# Patient Record
Sex: Female | Born: 1935 | Race: White | Hispanic: No | Smoking: Never smoker
Health system: Southern US, Community
[De-identification: ages and names within clinical notes are randomized; demographics above are authoritative.]

## PROBLEM LIST (undated history)

## (undated) DIAGNOSIS — E7800 Pure hypercholesterolemia, unspecified: Secondary | ICD-10-CM

## (undated) DIAGNOSIS — E119 Type 2 diabetes mellitus without complications: Secondary | ICD-10-CM

## (undated) DIAGNOSIS — K5790 Diverticulosis of intestine, part unspecified, without perforation or abscess without bleeding: Secondary | ICD-10-CM

## (undated) DIAGNOSIS — Z923 Personal history of irradiation: Secondary | ICD-10-CM

## (undated) DIAGNOSIS — C50919 Malignant neoplasm of unspecified site of unspecified female breast: Secondary | ICD-10-CM

## (undated) DIAGNOSIS — G252 Other specified forms of tremor: Secondary | ICD-10-CM

## (undated) DIAGNOSIS — I10 Essential (primary) hypertension: Secondary | ICD-10-CM

## (undated) DIAGNOSIS — Z9221 Personal history of antineoplastic chemotherapy: Secondary | ICD-10-CM

## (undated) HISTORY — DX: Essential (primary) hypertension: I10

## (undated) HISTORY — DX: Other specified forms of tremor: G25.2

## (undated) HISTORY — DX: Pure hypercholesterolemia, unspecified: E78.00

## (undated) HISTORY — DX: Diverticulosis of intestine, part unspecified, without perforation or abscess without bleeding: K57.90

## (undated) HISTORY — DX: Type 2 diabetes mellitus without complications: E11.9

## (undated) HISTORY — PX: ABDOMINAL HYSTERECTOMY: SHX81

## (undated) HISTORY — PX: APPENDECTOMY: SHX54

---

## 2003-10-21 ENCOUNTER — Emergency Department (HOSPITAL_COMMUNITY): Admission: EM | Admit: 2003-10-21 | Discharge: 2003-10-21 | Payer: Self-pay | Admitting: Emergency Medicine

## 2004-06-12 ENCOUNTER — Ambulatory Visit: Payer: Self-pay | Admitting: Family Medicine

## 2005-10-06 ENCOUNTER — Ambulatory Visit: Payer: Self-pay | Admitting: Family Medicine

## 2006-04-10 ENCOUNTER — Emergency Department: Payer: Self-pay | Admitting: Emergency Medicine

## 2006-10-14 ENCOUNTER — Ambulatory Visit: Payer: Self-pay | Admitting: Family Medicine

## 2007-07-04 ENCOUNTER — Emergency Department: Payer: Self-pay | Admitting: Emergency Medicine

## 2007-12-01 ENCOUNTER — Ambulatory Visit: Payer: Self-pay | Admitting: Family Medicine

## 2008-03-16 DIAGNOSIS — Z923 Personal history of irradiation: Secondary | ICD-10-CM

## 2008-03-16 DIAGNOSIS — C50919 Malignant neoplasm of unspecified site of unspecified female breast: Secondary | ICD-10-CM

## 2008-03-16 DIAGNOSIS — Z9221 Personal history of antineoplastic chemotherapy: Secondary | ICD-10-CM

## 2008-03-16 HISTORY — DX: Malignant neoplasm of unspecified site of unspecified female breast: C50.919

## 2008-03-16 HISTORY — DX: Personal history of antineoplastic chemotherapy: Z92.21

## 2008-03-16 HISTORY — PX: BREAST EXCISIONAL BIOPSY: SUR124

## 2008-03-16 HISTORY — DX: Personal history of irradiation: Z92.3

## 2008-12-12 ENCOUNTER — Emergency Department: Payer: Self-pay | Admitting: Emergency Medicine

## 2008-12-27 ENCOUNTER — Ambulatory Visit: Payer: Self-pay | Admitting: Family Medicine

## 2009-01-14 ENCOUNTER — Ambulatory Visit: Payer: Self-pay | Admitting: Family Medicine

## 2009-02-12 ENCOUNTER — Ambulatory Visit: Payer: Self-pay | Admitting: Surgery

## 2009-02-28 ENCOUNTER — Ambulatory Visit: Payer: Self-pay | Admitting: Cardiology

## 2009-02-28 ENCOUNTER — Ambulatory Visit: Payer: Self-pay | Admitting: Surgery

## 2009-03-05 ENCOUNTER — Ambulatory Visit: Payer: Self-pay | Admitting: Surgery

## 2009-03-16 ENCOUNTER — Ambulatory Visit: Payer: Self-pay | Admitting: Internal Medicine

## 2009-04-03 ENCOUNTER — Ambulatory Visit: Payer: Self-pay | Admitting: Internal Medicine

## 2009-04-16 ENCOUNTER — Ambulatory Visit: Payer: Self-pay | Admitting: Internal Medicine

## 2009-05-14 ENCOUNTER — Ambulatory Visit: Payer: Self-pay | Admitting: Internal Medicine

## 2009-06-14 ENCOUNTER — Ambulatory Visit: Payer: Self-pay | Admitting: Internal Medicine

## 2009-07-14 ENCOUNTER — Ambulatory Visit: Payer: Self-pay | Admitting: Internal Medicine

## 2009-08-14 ENCOUNTER — Ambulatory Visit: Payer: Self-pay | Admitting: Internal Medicine

## 2009-09-13 ENCOUNTER — Ambulatory Visit: Payer: Self-pay | Admitting: Internal Medicine

## 2009-10-14 ENCOUNTER — Ambulatory Visit: Payer: Self-pay | Admitting: Internal Medicine

## 2009-11-14 ENCOUNTER — Ambulatory Visit: Payer: Self-pay | Admitting: Internal Medicine

## 2009-11-27 ENCOUNTER — Ambulatory Visit: Payer: Self-pay | Admitting: Ophthalmology

## 2010-04-17 ENCOUNTER — Ambulatory Visit: Payer: Self-pay | Admitting: Family Medicine

## 2010-04-28 ENCOUNTER — Ambulatory Visit: Payer: Self-pay | Admitting: Radiation Oncology

## 2010-05-15 ENCOUNTER — Ambulatory Visit: Payer: Self-pay | Admitting: Radiation Oncology

## 2010-10-27 ENCOUNTER — Ambulatory Visit: Payer: Self-pay | Admitting: Internal Medicine

## 2010-10-31 LAB — CANCER ANTIGEN 27.29: CA 27.29: 25.3 U/mL (ref 0.0–38.6)

## 2010-11-15 ENCOUNTER — Ambulatory Visit: Payer: Self-pay | Admitting: Internal Medicine

## 2011-03-10 ENCOUNTER — Emergency Department: Payer: Self-pay | Admitting: Internal Medicine

## 2011-04-20 ENCOUNTER — Ambulatory Visit: Payer: Self-pay | Admitting: Oncology

## 2011-05-06 ENCOUNTER — Ambulatory Visit: Payer: Self-pay | Admitting: Internal Medicine

## 2011-05-06 LAB — CBC CANCER CENTER
Basophil #: 0 x10 3/mm (ref 0.0–0.1)
Basophil %: 0.5 %
Eosinophil #: 0.2 x10 3/mm (ref 0.0–0.7)
HGB: 13.3 g/dL (ref 12.0–16.0)
Lymphocyte #: 1.6 x10 3/mm (ref 1.0–3.6)
Lymphocyte %: 23.5 %
MCH: 29.4 pg (ref 26.0–34.0)
MCHC: 34.3 g/dL (ref 32.0–36.0)
MCV: 86 fL (ref 80–100)
Neutrophil #: 4.3 x10 3/mm (ref 1.4–6.5)
RBC: 4.52 10*6/uL (ref 3.80–5.20)
RDW: 13.3 % (ref 11.5–14.5)
WBC: 6.8 x10 3/mm (ref 3.6–11.0)

## 2011-05-06 LAB — COMPREHENSIVE METABOLIC PANEL
Alkaline Phosphatase: 115 U/L (ref 50–136)
Anion Gap: 5 — ABNORMAL LOW (ref 7–16)
Calcium, Total: 8.9 mg/dL (ref 8.5–10.1)
Chloride: 102 mmol/L (ref 98–107)
Co2: 35 mmol/L — ABNORMAL HIGH (ref 21–32)
EGFR (Non-African Amer.): 57 — ABNORMAL LOW
SGPT (ALT): 19 U/L
Sodium: 142 mmol/L (ref 136–145)

## 2011-05-15 ENCOUNTER — Ambulatory Visit: Payer: Self-pay | Admitting: Internal Medicine

## 2011-11-03 ENCOUNTER — Ambulatory Visit: Payer: Self-pay | Admitting: Internal Medicine

## 2011-11-03 LAB — CREATININE, SERUM
Creatinine: 1.06 mg/dL (ref 0.60–1.30)
EGFR (Non-African Amer.): 48 — ABNORMAL LOW

## 2011-11-03 LAB — HEPATIC FUNCTION PANEL A (ARMC)
Albumin: 3.7 g/dL (ref 3.4–5.0)
Bilirubin, Direct: 0.1 mg/dL (ref 0.00–0.20)
Bilirubin,Total: 0.4 mg/dL (ref 0.2–1.0)

## 2011-11-03 LAB — CBC CANCER CENTER
Basophil #: 0 x10 3/mm (ref 0.0–0.1)
Basophil %: 0.6 %
Eosinophil %: 2.1 %
HGB: 12.7 g/dL (ref 12.0–16.0)
Lymphocyte #: 1.5 x10 3/mm (ref 1.0–3.6)
Lymphocyte %: 23.6 %
MCH: 28.2 pg (ref 26.0–34.0)
MCV: 87 fL (ref 80–100)
Monocyte %: 8.6 %
Neutrophil #: 4.1 x10 3/mm (ref 1.4–6.5)
Platelet: 204 x10 3/mm (ref 150–440)
RBC: 4.52 10*6/uL (ref 3.80–5.20)
WBC: 6.3 x10 3/mm (ref 3.6–11.0)

## 2011-11-15 ENCOUNTER — Ambulatory Visit: Payer: Self-pay | Admitting: Internal Medicine

## 2012-04-20 ENCOUNTER — Ambulatory Visit: Payer: Self-pay | Admitting: Surgery

## 2012-04-21 ENCOUNTER — Ambulatory Visit: Payer: Self-pay | Admitting: Oncology

## 2012-05-19 ENCOUNTER — Ambulatory Visit: Payer: Self-pay | Admitting: Oncology

## 2012-06-03 LAB — CBC CANCER CENTER
Basophil #: 0 x10 3/mm (ref 0.0–0.1)
Basophil %: 0.7 %
Eosinophil #: 0.2 x10 3/mm (ref 0.0–0.7)
HCT: 37.6 % (ref 35.0–47.0)
Lymphocyte #: 1.9 x10 3/mm (ref 1.0–3.6)
Lymphocyte %: 28.9 %
MCHC: 34.1 g/dL (ref 32.0–36.0)
Monocyte #: 0.6 x10 3/mm (ref 0.2–0.9)
Monocyte %: 8.5 %
Neutrophil %: 59.2 %
Platelet: 201 x10 3/mm (ref 150–440)
RBC: 4.39 10*6/uL (ref 3.80–5.20)
RDW: 12.9 % (ref 11.5–14.5)
WBC: 6.8 x10 3/mm (ref 3.6–11.0)

## 2012-06-03 LAB — CREATININE, SERUM
EGFR (African American): 55 — ABNORMAL LOW
EGFR (Non-African Amer.): 48 — ABNORMAL LOW

## 2012-06-03 LAB — HEPATIC FUNCTION PANEL A (ARMC)
Bilirubin, Direct: <0.05 mg/dL (ref 0.00–0.20)
Bilirubin,Total: 0.3 mg/dL (ref 0.2–1.0)
SGOT(AST): 17 U/L (ref 15–37)
Total Protein: 6.7 g/dL (ref 6.4–8.2)

## 2012-06-14 ENCOUNTER — Ambulatory Visit: Payer: Self-pay | Admitting: Oncology

## 2012-11-15 ENCOUNTER — Ambulatory Visit: Payer: Self-pay | Admitting: Oncology

## 2012-12-09 ENCOUNTER — Ambulatory Visit: Payer: Self-pay | Admitting: Oncology

## 2012-12-14 ENCOUNTER — Ambulatory Visit: Payer: Self-pay | Admitting: Oncology

## 2013-01-14 ENCOUNTER — Inpatient Hospital Stay: Payer: Self-pay | Admitting: Internal Medicine

## 2013-01-14 LAB — COMPREHENSIVE METABOLIC PANEL
Albumin: 3.1 g/dL — ABNORMAL LOW (ref 3.4–5.0)
Alkaline Phosphatase: 93 U/L (ref 50–136)
Bilirubin,Total: 1 mg/dL (ref 0.2–1.0)
Chloride: 100 mmol/L (ref 98–107)
Co2: 28 mmol/L (ref 21–32)
Creatinine: 1.3 mg/dL (ref 0.60–1.30)
EGFR (African American): 43 — ABNORMAL LOW
Osmolality: 280 (ref 275–301)
Sodium: 134 mmol/L — ABNORMAL LOW (ref 136–145)

## 2013-01-14 LAB — URINALYSIS, COMPLETE
Glucose,UR: NEGATIVE mg/dL (ref 0–75)
Nitrite: NEGATIVE
Ph: 5 (ref 4.5–8.0)
Protein: >=500
RBC,UR: 12 /HPF (ref 0–5)
WBC UR: 35 /HPF (ref 0–5)

## 2013-01-14 LAB — CBC WITH DIFFERENTIAL/PLATELET
Basophil #: 0 10*3/uL (ref 0.0–0.1)
Eosinophil %: 0.1 %
HCT: 42.3 % (ref 35.0–47.0)
HGB: 14.4 g/dL (ref 12.0–16.0)
Lymphocyte #: 0.6 10*3/uL — ABNORMAL LOW (ref 1.0–3.6)
MCH: 28.4 pg (ref 26.0–34.0)
MCHC: 33.9 g/dL (ref 32.0–36.0)
Monocyte #: 1.1 x10 3/mm — ABNORMAL HIGH (ref 0.2–0.9)
Monocyte %: 6 %
Neutrophil %: 90.1 %
Platelet: 192 10*3/uL (ref 150–440)
RBC: 5.06 10*6/uL (ref 3.80–5.20)
WBC: 17.9 10*3/uL — ABNORMAL HIGH (ref 3.6–11.0)

## 2013-01-14 LAB — TROPONIN I
Troponin-I: 4.9 ng/mL — ABNORMAL HIGH
Troponin-I: 3.2 ng/mL — ABNORMAL HIGH

## 2013-01-14 LAB — CK TOTAL AND CKMB (NOT AT ARMC)
CK, Total: 30744 U/L — ABNORMAL HIGH (ref 21–215)
CK-MB: 51.1 ng/mL — ABNORMAL HIGH (ref 0.5–3.6)

## 2013-01-15 LAB — CBC WITH DIFFERENTIAL/PLATELET
Basophil %: 0.4 %
Eosinophil #: 0.1 10*3/uL (ref 0.0–0.7)
HGB: 11.9 g/dL — ABNORMAL LOW (ref 12.0–16.0)
Lymphocyte #: 1.2 10*3/uL (ref 1.0–3.6)
Lymphocyte %: 9.9 %
MCH: 28.9 pg (ref 26.0–34.0)
MCHC: 34.2 g/dL (ref 32.0–36.0)
Monocyte %: 7.6 %
Neutrophil #: 9.8 10*3/uL — ABNORMAL HIGH (ref 1.4–6.5)
Platelet: 164 10*3/uL (ref 150–440)
RBC: 4.12 10*6/uL (ref 3.80–5.20)
RDW: 13.4 % (ref 11.5–14.5)

## 2013-01-15 LAB — TROPONIN I: Troponin-I: 2.1 ng/mL — ABNORMAL HIGH

## 2013-01-15 LAB — PROTIME-INR
INR: 1.2
Prothrombin Time: 15.4 secs — ABNORMAL HIGH (ref 11.5–14.7)

## 2013-01-15 LAB — COMPREHENSIVE METABOLIC PANEL
Albumin: 2.3 g/dL — ABNORMAL LOW (ref 3.4–5.0)
BUN: 36 mg/dL — ABNORMAL HIGH (ref 7–18)
EGFR (Non-African Amer.): 31 — ABNORMAL LOW
Glucose: 113 mg/dL — ABNORMAL HIGH (ref 65–99)
Osmolality: 279 (ref 275–301)
SGOT(AST): 564 U/L — ABNORMAL HIGH (ref 15–37)
SGPT (ALT): 146 U/L — ABNORMAL HIGH (ref 12–78)
Sodium: 135 mmol/L — ABNORMAL LOW (ref 136–145)
Total Protein: 5.3 g/dL — ABNORMAL LOW (ref 6.4–8.2)

## 2013-01-15 LAB — CK TOTAL AND CKMB (NOT AT ARMC): CK, Total: 18688 U/L — ABNORMAL HIGH (ref 21–215)

## 2013-01-16 LAB — BASIC METABOLIC PANEL
Anion Gap: 3 — ABNORMAL LOW (ref 7–16)
BUN: 25 mg/dL — ABNORMAL HIGH (ref 7–18)
Co2: 25 mmol/L (ref 21–32)
Creatinine: 1.04 mg/dL (ref 0.60–1.30)
EGFR (African American): 57 — ABNORMAL LOW
Sodium: 138 mmol/L (ref 136–145)

## 2013-01-16 LAB — CK: CK, Total: 15466 U/L — ABNORMAL HIGH (ref 21–215)

## 2013-01-17 LAB — URINE CULTURE

## 2013-01-17 LAB — CK: CK, Total: 9544 U/L — ABNORMAL HIGH (ref 21–215)

## 2013-01-19 LAB — BASIC METABOLIC PANEL
Anion Gap: 2 — ABNORMAL LOW (ref 7–16)
BUN: 10 mg/dL (ref 7–18)
Calcium, Total: 8.7 mg/dL (ref 8.5–10.1)
Chloride: 108 mmol/L — ABNORMAL HIGH (ref 98–107)
EGFR (African American): >60
EGFR (Non-African Amer.): >60
Potassium: 3.8 mmol/L (ref 3.5–5.1)
Sodium: 141 mmol/L (ref 136–145)

## 2013-01-19 LAB — CBC WITH DIFFERENTIAL/PLATELET
Basophil #: 0.1 10*3/uL (ref 0.0–0.1)
Basophil %: 1.5 %
Eosinophil %: 5.2 %
HCT: 32.9 % — ABNORMAL LOW (ref 35.0–47.0)
HGB: 11.1 g/dL — ABNORMAL LOW (ref 12.0–16.0)
Lymphocyte #: 1.5 10*3/uL (ref 1.0–3.6)
Lymphocyte %: 22.6 %
MCH: 28.5 pg (ref 26.0–34.0)
MCHC: 33.7 g/dL (ref 32.0–36.0)
MCV: 84 fL (ref 80–100)
Monocyte %: 10.3 %
Neutrophil #: 4 10*3/uL (ref 1.4–6.5)
Neutrophil %: 60.4 %
Platelet: 169 10*3/uL (ref 150–440)
RBC: 3.9 10*6/uL (ref 3.80–5.20)

## 2013-04-21 ENCOUNTER — Ambulatory Visit: Payer: Self-pay | Admitting: Oncology

## 2013-04-24 DIAGNOSIS — I10 Essential (primary) hypertension: Secondary | ICD-10-CM | POA: Insufficient documentation

## 2013-04-24 DIAGNOSIS — L259 Unspecified contact dermatitis, unspecified cause: Secondary | ICD-10-CM | POA: Insufficient documentation

## 2013-04-24 DIAGNOSIS — K5790 Diverticulosis of intestine, part unspecified, without perforation or abscess without bleeding: Secondary | ICD-10-CM | POA: Insufficient documentation

## 2013-04-24 DIAGNOSIS — G252 Other specified forms of tremor: Secondary | ICD-10-CM | POA: Insufficient documentation

## 2013-04-24 DIAGNOSIS — E119 Type 2 diabetes mellitus without complications: Secondary | ICD-10-CM | POA: Insufficient documentation

## 2013-04-24 DIAGNOSIS — E7800 Pure hypercholesterolemia, unspecified: Secondary | ICD-10-CM | POA: Insufficient documentation

## 2013-06-07 ENCOUNTER — Ambulatory Visit: Payer: Self-pay | Admitting: Oncology

## 2013-06-10 LAB — CANCER ANTIGEN 27.29: CA 27.29: 28.7 U/mL (ref 0.0–38.6)

## 2013-06-14 ENCOUNTER — Ambulatory Visit: Payer: Self-pay | Admitting: Oncology

## 2013-08-02 ENCOUNTER — Emergency Department: Payer: Self-pay | Admitting: Emergency Medicine

## 2013-10-17 ENCOUNTER — Ambulatory Visit: Payer: Self-pay | Admitting: Oncology

## 2013-12-11 ENCOUNTER — Ambulatory Visit: Payer: Self-pay | Admitting: Oncology

## 2013-12-14 ENCOUNTER — Ambulatory Visit: Payer: Self-pay | Admitting: Oncology

## 2014-04-23 ENCOUNTER — Ambulatory Visit: Payer: Self-pay | Admitting: Oncology

## 2014-06-11 ENCOUNTER — Ambulatory Visit
Admit: 2014-06-11 | Disposition: A | Discharge status: Home / Self Care | Payer: Self-pay | Attending: Oncology | Admitting: Oncology

## 2014-06-11 ENCOUNTER — Ambulatory Visit
Admit: 2014-06-11 | Disposition: A | Discharge status: Home / Self Care | Payer: Self-pay | Attending: Internal Medicine | Admitting: Internal Medicine

## 2014-06-15 ENCOUNTER — Ambulatory Visit
Admit: 2014-06-15 | Disposition: A | Discharge status: Home / Self Care | Payer: Self-pay | Attending: Oncology | Admitting: Oncology

## 2014-06-29 ENCOUNTER — Other Ambulatory Visit: Payer: Self-pay | Admitting: Oncology

## 2014-06-29 DIAGNOSIS — Z780 Asymptomatic menopausal state: Secondary | ICD-10-CM

## 2014-06-29 DIAGNOSIS — Z13820 Encounter for screening for osteoporosis: Secondary | ICD-10-CM

## 2014-07-06 NOTE — Consult Note (Signed)
PATIENT NAME:  Nancy, Zhang MR#:  [id number] DATE OF BIRTH:  June 21, 1925  DATE OF CONSULTATION:  01/14/2013  REFERRING PHYSICIAN:  Dr. Shade Flood and Dr. Georgie Chard CONSULTING PHYSICIAN:  Dwayne D. Callwood, MD  INDICATION: Syncope with rhabdomyolysis.   HISTORY OF PRESENT ILLNESS: The patient is an 81 year old female who lives alone, the patient gives a history of fall in the bathroom off the toilet where she had a syncopal episode. The patient lied on the floor for prolonged period of time. She subsequently developed rib pain and was brought to the Emergency Room where she was noted to have dehydration, elevated cardiac enzymes and rhabdomyolysis.  She was dehydrated and now admitted for further evaluation and care. Denied any chest pain. Denied any palpitations or arrhythmia. It is not clear whether she passed out or just slipped and fell, but was unable to get up.   PAST MEDICAL HISTORY: Diabetes, breast cancer, hypertension, hyperlipidemia.   PAST SURGICAL HISTORY: Hysterectomy, appendectomy.   FAMILY HISTORY: Coronary artery disease.   SOCIAL HISTORY: Lives alone. No smoking or alcohol consumption. Was a former smoker but quit 10 years ago.   ALLERGIES: SHELLFISH.   MEDICATIONS: Quinapril 40 a day, metformin 500 twice a day, letrozole 2.5 once a day, hydrochlorothiazide  25 a day, Lipitor 10 a day, Norvasc 5 a day.    REVIEW OF SYSTEMS: She has had what sounds like a possible syncopal or blackout spell the way she fell. No shortness of breath. No nausea or vomiting. No weight loss. No weight gain. No hemoptysis or hematemesis. No bright red blood per rectum. No vision change or hearing change. Denies sputum production or cough. She got dehydrated and was on the floor for a while and now has some mild rhabdomyolysis.   PHYSICAL EXAMINATION: VITAL SIGNS: Blood pressure is 110/60, pulse of 95, respiratory rate of 18, afebrile.  HEENT: Normocephalic, atraumatic. Pupils equal and  reactive to light.  NECK: Supple. No significant JVD, bruits or adenopathy.  LUNGS: Clear to auscultation and percussion. No significant wheeze, rhonchi or rale.  HEART: Regular rate and rhythm without M,G,R murmur, gallops or rubs.  ABDOMEN: Benign.  EXTREMITIES: 1+ edema.  NEUROLOGIC: Intact.  SKIN: Normal.   LABORATORIES: Chest x-ray unremarkable.   EKG: Sinus tachycardia, nonspecific ST-T changes, possible old inferior MI.   CT of the head shows atrophy otherwise negative.   UA: 2+ blood and 2+ bacteria. White count was 17.9, hemoglobin of 14, glucose 203, BUN 30, creatinine 1.3, GFR 37, sodium 134, potassium 2.9. CPK was 30,000; MB 51, Troponin 4.9.   ASSESSMENT: Possible syncope, fall, rhabdomyolysis, elevated troponin, diabetes, hypertension, renal insufficiency.   PLAN: Agree with admit. Hydrate the patient. Use bicarb for renal protection. Treat possible UTI. Followup further cardiac enzymes. Followup for rhabdomyolysis. Consider echocardiogram and Dopplers for possible syncope. Consider physical therapy and occupational therapy. Social work consult. Would treat the patient conservatively from a cardiac standpoint, control her blood pressure and continue to treat diabetes. Followup her renal insufficiency because of possible rhabdomyolysis. Continue significant aggressive hydration with bicarb as well. Have the patient undergo syncope workup as well, . If the cardiac studies are negative and rhabdomyolysis improves, then this is more of a social issue about routine falls and whether a higher level care is necessary for this patient.   ____________________________ Loran Senters. Clayborn Bigness, MD ddc:sg D: 01/16/2013 14:21:00 ET T: 01/16/2013 14:51:19 ET JOB#: [id number]  cc: Dwayne D. Clayborn Bigness, MD, <Dictator> Yolonda Kida MD ELECTRONICALLY SIGNED 02/20/2013  9:36 

## 2014-07-06 NOTE — Discharge Summary (Signed)
PATIENT NAME:  Nancy Zhang, Nancy Zhang MR#:  [id number] DATE OF BIRTH:  1925-12-11  DISCHARGE DIAGNOSES:  1.  Severe rhabdomyolysis, improving with hydration.  2.  Elevated troponin, likely due to rhabdomyolysis with normal echocardiogram.  3.  Elevated liver function tests, likely from rhabdomyolysis.  4.  Escherichia coli and Klebsiella urinary tract infection, improving on antibiotics.  5.  Hypokalemia, repleted and resolved.  6.  Right arm lymphedema due to breast cancer. No deep vein thrombosis.   SECONDARY DIAGNOSES:  1.  Type 2 diabetes.  2.  Hypertension.   CONSULTATION: Physical therapy.   PROCEDURES AND RADIOLOGY: Chest x-ray on 1st of November showed no acute cardiopulmonary disease.   CT scan of the head without contrast on the 1st of November showed atrophy with no acute intracranial abnormality.   Bilateral carotid Dopplers on the 2nd of November  showed atherosclerotic changes. No hemodynamically significant stenosis.   Abdominal ultrasound on the 2nd of November showed mild hepatomegaly and cholelithiasis. No acute cholecystitis.   Right upper extremity Doppler on the 5th of November showed no DVT.   A 2-D echocardiogram on the 2nd of November showed normal LV ejection fraction with EF of 50% to 55%. Moderately increased left ventricular posterior-wall thickness.   MAJOR LABORATORY PANELS: Urinalysis on admission showed 3+ bacteria, 35 WBCs, 1+ leuk esterase, WBC in clumps.   Urine culture grew more than 100,000 colonies of E. coli and 60,000 colonies of Klebsiella pneumoniae.   HISTORY AND SHORT HOSPITAL COURSE: The patient is an 81 year old female with the above-mentioned medical problems who was admitted for syncope. Syncope thought to be secondary to rhabdomyolysis. Please see Dr. Fulton Reek' dictated history and physical for further details. Cardiology consultation was obtained with Dr. Clayborn Bigness for elevated cardiac enzymes which were thought to be due to  rhabdomyolysis and not true myocardial infarction.   The patient was hydrated aggressively with IV fluids and was slowly improving. Was evaluated  by physical therapy and was recommended home with home health or 24-hour assist. She was also requested to consider rehab, although the patient was very adamant not to go to any kind of facility and just wanted to go home.   The patient had some edema on her right arm for which she underwent Doppler and was negative. This was thought to be lymphedema from her underlying breast cancer and possible lymph node removal leading to lymphedema. She also received a lot of fluids which could have made her edema worse on her right arm.   She was feeling significantly better. Her CK on admission was more than 30,000 and after aggressive hydration, her last CK was 2312. The patient was feeling significantly better and was discharged home in stable condition.   DISCHARGE PHYSICAL EXAMINATION:  VITAL SIGNS: Temperature 98.6, respirations 18 per minute, blood pressure 146/77 mmHg. She was saturating 95% on room air.  CARDIOVASCULAR: S1, S2 normal. No murmurs, rubs, or gallop.  LUNGS: Clear to auscultation bilaterally. No wheezing, rales, rhonchi or crepitation.  ABDOMEN: Soft, benign.  NEUROLOGIC: Nonfocal examination.   All other physical examination remained at baseline.   DISCHARGE MEDICATIONS:  1.  Metformin 500 mg p.o. b.i.d. 2.  Amlodipine 5 mg p.o. at bedtime.  3.  Quinapril 40 mg p.o. daily.  4.  Hydrochlorothiazide 25 mg p.o. daily.  5.  Letrozole 2.5 mg p.o. daily.  6.  Levofloxacin 250 mg p.o. daily for 4 more days.   DISCHARGE DIET: Low sodium.   DISCHARGE ACTIVITY: As tolerated.  DISCHARGE INSTRUCTIONS AND FOLLOWUP: The patient was instructed to follow up with her primary care physician, Dr. Kendall Flack, in 1 to 2 weeks. She was set up to get home health, physical therapy nurse, and nursing aide.   TOTAL TIME DISCHARGING THIS PATIENT: More than 55  minutes.    ____________________________ Lucina Mellow. Manuella Ghazi, MD vss:np D: 01/19/2013 18:38:42 ET T: 01/19/2013 20:23:45 ET JOB#: [id number]  cc: Corleone Biegler S. Manuella Ghazi, MD, <Dictator> Turner Daniels, MD Lucina Mellow [facility] MD ELECTRONICALLY SIGNED 01/24/2013 12:05

## 2014-07-06 NOTE — H&P (Signed)
PATIENT NAME:  Nancy Zhang, Nancy Zhang MR#:  [id number] DATE OF BIRTH:  12-29-1925  DATE OF ADMISSION:  01/14/2013  REFERRING PHYSICIAN:  Dr. Cinda Quest.   FAMILY PHYSICIAN:  Dr. Marian Sorrow.   REASON FOR ADMISSION:  Syncope with rhabdomyolysis.   HISTORY OF PRESENT ILLNESS:  The patient is an 81 year old female who lives alone.  She was going to the bathroom and fell off the toilet where she had a syncopal episode.  She laid on the floor for a prolonged period of time.  She was subsequently found by family and brought to the Emergency Room where she was noted to be dehydrated with elevated cardiac enzymes consistent with rhabdomyolysis.  She was dehydrated and is now admitted for further evaluation.   PAST MEDICAL HISTORY: 1.  Type 2 diabetes.  2.  Breast cancer.  3.  Benign hypertension.  4.  Status post hysterectomy.  5.  Status post appendectomy.   MEDICATIONS: 1.  Quinapril 40 mg by mouth daily.  2.  Metformin 500 mg by mouth twice daily.  3.  Letrozole 2.5 mg by mouth daily.  4.  Hydrochlorothiazide 25 mg by mouth daily.  5.  Lipitor 10 mg by mouth daily.  6.  Norvasc 5 mg by mouth daily.   ALLERGIES:  SHELLFISH.   SOCIAL HISTORY:  The patient lives alone.  No history of alcohol abuse.  She is a former smoker, but quit greater than 10 years ago.   FAMILY HISTORY:  Positive for coronary artery disease.  Otherwise unremarkable.   REVIEW OF SYSTEMS:  CONSTITUTIONAL:  No fever or change in weight.  EYES:  No blurred or double vision.  Does have history of cataracts.  EARS, NOSE, THROAT:  No tinnitus or hearing loss.  No nasal discharge or bleeding.  RESPIRATORY:  No cough or wheezing.  Denies hemoptysis.  CARDIOVASCULAR:  No chest pain or orthopnea.  No palpitations.  GASTROINTESTINAL:  No nausea, vomiting or diarrhea.  No abdominal pain.  GENITOURINARY:  No dysuria, hematuria.  ENDOCRINE:  No polyuria or polydipsia.  No heat or cold intolerance.  HEMATOLOGIC:  The patient denies anemia,  easy bruising or bleeding.  LYMPHATIC:  No swollen glands.  MUSCULOSKELETAL:  The patient has generalized pain, but no specific joint areas.  No gout.  NEUROLOGIC:  No numbness or migraines.  Denies stroke or seizures.  PSYCHIATRIC:  The patient denies anxiety, insomnia, depression.   PHYSICAL EXAMINATION: GENERAL:  The patient is chronically ill-appearing, but in no acute distress.  VITAL SIGNS:  Remarkable for a blood pressure of 110/55, with a heart rate of 94 and a respiratory rate of 18.  She is afebrile.  HEENT:  Normocephalic, atraumatic.  Pupils equally round and reactive to light and accommodation.  Extraocular movements are intact.  Sclerae are nonicteric.  Conjunctivae are clear.  Oropharynx dry, but clear.  NECK:  Supple without JVD.  No adenopathy or thyromegaly is noted.  LUNGS:  Clear to auscultation and percussion without wheezes, rales or rhonchi.  No dullness.  Respiratory effort is normal.  CARDIAC:  Regular rate and rhythm with a normal S1 and S2.  No significant rubs, murmurs or gallops.  PMI is nondisplaced.  Chest wall is nontender.  ABDOMEN:  Soft, nontender with normoactive bowel sounds.  No organomegaly or masses were appreciated.  No hernias or bruits were noted.  EXTREMITIES:  Stasis changes with trace edema.  Pulses were 1+ bilaterally.  SKIN:  Warm and dry without rash or lesions.  NEUROLOGIC:  Cranial nerves II through XII grossly intact.  Deep tendon reflexes were symmetric.  Motor and sensory examination is nonfocal.  PSYCHIATRIC:  Revealed a patient who is alert and oriented to person, place and time.  She was cooperative and used good judgment.   LABORATORY DATA:  Chest x-ray was unremarkable.  EKG revealed sinus tachycardia with questionable old inferior MI.  No acute ischemic changes were noted.  Head CT revealed atrophy with no acute intracranial abnormality.  Urinalysis revealed 3+ blood with proteinuria, 1+ leukocyte esterase and 3+ bacteria.  Her white  count was 17.9 with a hemoglobin of 14.4.  Glucose was 203 with a BUN of 30, creatinine 1.30 with a GFR of 37, sodium of 134 and a potassium of 3.9.  Her CPK was 30,744 with an MB of 51.1 and a troponin of 4.9.   ASSESSMENT: 1.  Elevated troponin of unclear significance.  2.  Presumed rhabdomyolysis.  3.  Syncope.  4.  Type 2 diabetes.  5.  Benign hypertension.  6.  Renal insufficiency.   PLAN:  The patient will be admitted to telemetry with IV fluids and IV antibiotics.  We will send off urine culture.  Follow sugars with Accu-Cheks before meals and at bedtime and add sliding scale insulin as needed.  We will follow serial cardiac enzymes.  Obtain carotid Dopplers and an echocardiogram.  Cardiology consult as well.  We will consult physical therapy and social work as the patient will probably require placement.  Clear liquid diet for now.  Routine followup labs in the morning.  Further treatment and evaluation will depend upon the patient's progress.   Total time spent on this patient was 50 minutes.     ____________________________ Leonie Douglas Doy Hutching, MD jds:ea D: 01/14/2013 16:01:44 ET T: 01/14/2013 16:17:05 ET JOB#: [id number]  cc: Leonie Douglas. Doy Hutching, MD, <Dictator> Sena Hitch, MD Wakisha Alberts Lennice Sites MD ELECTRONICALLY SIGNED 01/15/2013 10:52

## 2014-10-22 ENCOUNTER — Ambulatory Visit
Admission: RE | Admit: 2014-10-22 | Discharge: 2014-10-22 | Disposition: A | Discharge status: Home / Self Care | Payer: Medicare Other | Source: Ambulatory Visit | Attending: Oncology | Admitting: Oncology

## 2014-10-22 DIAGNOSIS — M8580 Other specified disorders of bone density and structure, unspecified site: Secondary | ICD-10-CM | POA: Insufficient documentation

## 2014-10-22 DIAGNOSIS — Z13820 Encounter for screening for osteoporosis: Secondary | ICD-10-CM | POA: Diagnosis not present

## 2014-10-22 DIAGNOSIS — Z780 Asymptomatic menopausal state: Secondary | ICD-10-CM

## 2014-10-22 HISTORY — DX: Malignant neoplasm of unspecified site of unspecified female breast: C50.919

## 2014-12-05 ENCOUNTER — Encounter: Payer: Self-pay | Admitting: Oncology

## 2014-12-05 ENCOUNTER — Inpatient Hospital Stay: Payer: Medicare Other | Attending: Oncology | Admitting: Oncology

## 2014-12-05 VITALS — BP 164/79 | HR 90 | Temp 98.5°F | Resp 16 | Wt 149.7 lb

## 2014-12-05 DIAGNOSIS — E780 Pure hypercholesterolemia: Secondary | ICD-10-CM | POA: Insufficient documentation

## 2014-12-05 DIAGNOSIS — I10 Essential (primary) hypertension: Secondary | ICD-10-CM | POA: Insufficient documentation

## 2014-12-05 DIAGNOSIS — Z853 Personal history of malignant neoplasm of breast: Secondary | ICD-10-CM | POA: Diagnosis not present

## 2014-12-05 DIAGNOSIS — K5790 Diverticulosis of intestine, part unspecified, without perforation or abscess without bleeding: Secondary | ICD-10-CM | POA: Insufficient documentation

## 2014-12-05 DIAGNOSIS — Z170 Estrogen receptor positive status [ER+]: Secondary | ICD-10-CM | POA: Insufficient documentation

## 2014-12-05 DIAGNOSIS — Z79899 Other long term (current) drug therapy: Secondary | ICD-10-CM | POA: Insufficient documentation

## 2014-12-05 DIAGNOSIS — M8580 Other specified disorders of bone density and structure, unspecified site: Secondary | ICD-10-CM

## 2014-12-05 DIAGNOSIS — Z9223 Personal history of estrogen therapy: Secondary | ICD-10-CM

## 2014-12-05 DIAGNOSIS — C50911 Malignant neoplasm of unspecified site of right female breast: Secondary | ICD-10-CM

## 2014-12-05 NOTE — Progress Notes (Signed)
Patient stopped the Letrozole in 09/2014 as advised.

## 2014-12-13 NOTE — Progress Notes (Signed)
[place]  Telephone:(336) [phone] Fax:(336) [phone]  ID: Nancy Zhang OB: 03-Jul-1925  MR#: [id number]  AOZ#:[id number]  No care team member to display  CHIEF COMPLAINT:  Chief Complaint  Patient presents with  . Breast Cancer    INTERVAL HISTORY: Patient returns to clinic today for routine 6 month evaluation.  She continues to feel well and remains asymptomatic.  She completed 5 years of letrozole in July 2016.  She has no neurologic complaints.  She has a good appetite and denies weight loss.  She denies any pain.  She has no chest pain or shortness of breath.  She denies any nausea, vomiting, constipation, or diarrhea.  She has no urinary complaints. Patient offers no specific complaints today.   REVIEW OF SYSTEMS:   Review of Systems  Constitutional: Negative.   Respiratory: Negative.   Cardiovascular: Negative.   Musculoskeletal: Negative.   Neurological: Negative.     As per HPI. Otherwise, a complete review of systems is negatve.  PAST MEDICAL HISTORY: Past Medical History  Diagnosis Date  . Breast cancer 2010    right breast with chemo and radiation  . Hypertension   . Diverticulosis   . Intention tremor   . Diabetes   . Hypercholesterolemia     PAST SURGICAL HISTORY: Past Surgical History  Procedure Laterality Date  . Appendectomy    . Abdominal hysterectomy      FAMILY HISTORY No family history on file.     ADVANCED DIRECTIVES:    HEALTH MAINTENANCE: Social History  Substance Use Topics  . Smoking status: Never Smoker   . Smokeless tobacco: Never Used  . Alcohol Use: No     Colonoscopy:  PAP:  Bone density:  Lipid panel:  Allergies  Allergen Reactions  . Shellfish Allergy Swelling    Current Outpatient Prescriptions  Medication Sig Dispense Refill  . amLODipine (NORVASC) 5 MG tablet Take by mouth.    Marland Kitchen atorvastatin (LIPITOR) 10 MG tablet Take by mouth.    . hydrochlorothiazide (HYDRODIURIL) 25 MG tablet Take  by mouth.    . metFORMIN (GLUCOPHAGE-XR) 500 MG 24 hr tablet Take by mouth.    . naproxen (NAPROSYN) 250 MG tablet Take by mouth.     No current facility-administered medications for this visit.    OBJECTIVE: Filed Vitals:   12/05/14 1035  BP: 164/79  Pulse: 90  Temp: 98.5 F (36.9 C)  Resp: 16     There is no height on file to calculate BMI.    ECOG FS:0 - Asymptomatic  General: Well-developed, well-nourished, no acute distress. Eyes: Pink conjunctiva, anicteric sclera. Breasts: Bilateral breasts and axilla without lumps or masses. Lungs: Clear to auscultation bilaterally. Heart: Regular rate and rhythm. No rubs, murmurs, or gallops. Abdomen: Soft, nontender, nondistended. No organomegaly noted, normoactive bowel sounds. Musculoskeletal: No edema, cyanosis, or clubbing. Neuro: Alert, answering all questions appropriately. Cranial nerves grossly intact. Skin: No rashes or petechiae noted. Psych: Normal affect.   LAB RESULTS:  Lab Results  Component Value Date   NA 141 01/19/2013   K 3.8 01/19/2013   CL 108* 01/19/2013   CO2 31 01/19/2013   GLUCOSE 129* 01/19/2013   BUN 10 01/19/2013   CREATININE 0.87 01/19/2013   CALCIUM 8.7 01/19/2013   PROT 5.3* 01/15/2013   ALBUMIN 2.3* 01/15/2013   AST 564* 01/15/2013   ALT 146* 01/15/2013   ALKPHOS 82 01/15/2013   BILITOT 0.7 01/15/2013   GFRNONAA >60 01/19/2013   GFRAA >60 01/19/2013  Lab Results  Component Value Date   WBC 6.5 01/19/2013   NEUTROABS 4.0 01/19/2013   HGB 11.1* 01/19/2013   HCT 32.9* 01/19/2013   MCV 84 01/19/2013   PLT 169 01/19/2013     STUDIES: No results found.  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  Breast cancer: No evidence of disease.  Patient's most recent mammogram on April 23, 2014 was reported as BI-RADS 2.  Repeat in February 2017.  Patient completed 5 years of letrozole in July 2016. No further intervention  is needed at this time. Return to clinic in 1 year for routine evaluation. 2. Osteopenia: Patient's bone mineral density in August 2016 was reported with a T score of -2.4. This is essentially unchanged from previous. Continue calcium and vitamin D. Consider Fosamax in the future. Return to clinic as above. 3. Hypertension: Patient's blood pressure is mildly elevated today. Continue current medications. Treatment per PCP.   Patient expressed understanding and was in agreement with this plan. She also understands that She can call clinic at any time with any questions, concerns, or complaints.   No matching staging information was found for the patient.  Lloyd Huger, MD   12/13/2014 2:31 PM

## 2014-12-21 IMAGING — CR DG CHEST 1V PORT
1 series · 1 of 1 positions shown · non-contrast
Comparison: none

REASON FOR EXAM: falling
COMMENTS:

PROCEDURE:     DXR - DXR PORTABLE CHEST SINGLE VIEW  - January 14, 2013  [DATE]
RESULT:     Cardiac monitoring electrodes are present. The lungs are clear.
The heart and pulmonary vessels are normal. The bony and mediastinal
structures are unremarkable.

[ap]
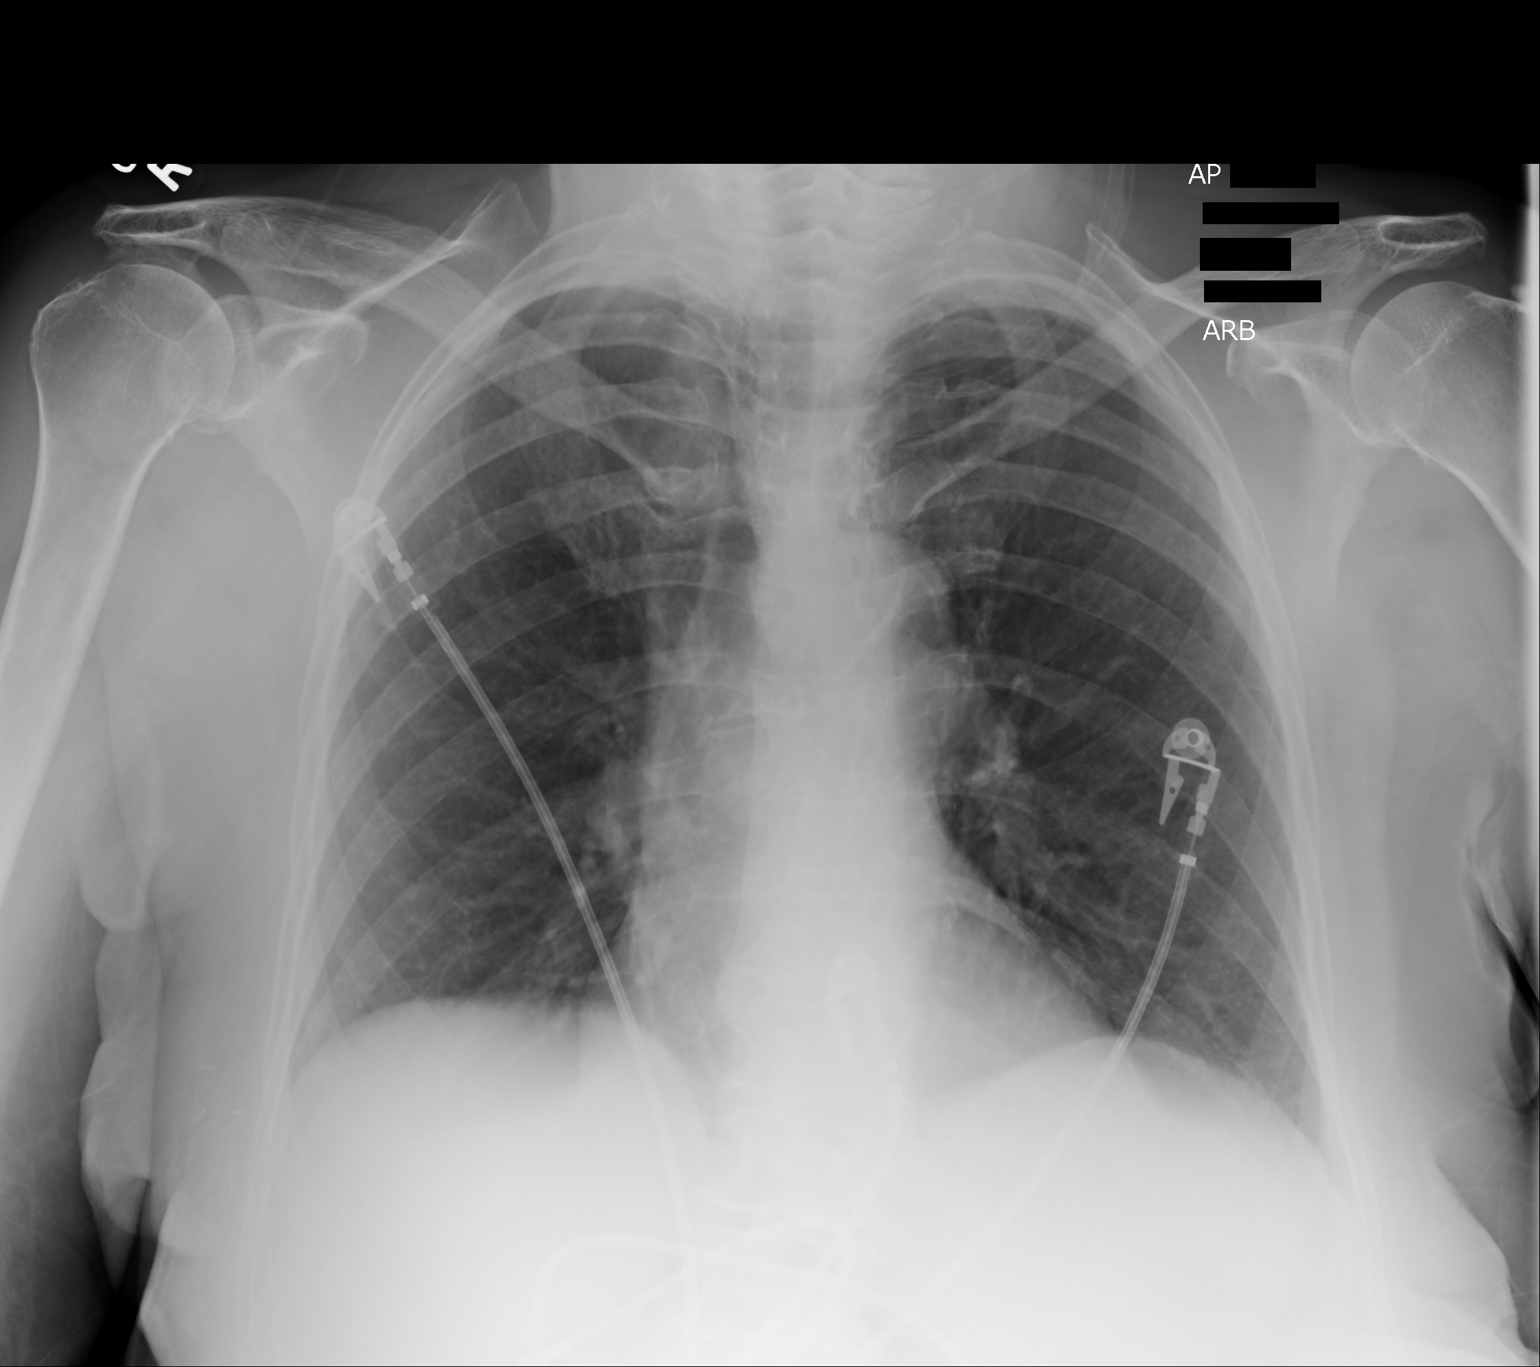

[1 of 1 positions shown; findings below may reference images not displayed]

IMPRESSION: 1. No acute cardiopulmonary disease.

[REDACTED]

## 2015-04-29 ENCOUNTER — Other Ambulatory Visit: Payer: Self-pay | Admitting: Oncology

## 2015-04-29 ENCOUNTER — Ambulatory Visit
Admission: RE | Admit: 2015-04-29 | Discharge: 2015-04-29 | Disposition: A | Discharge status: Home / Self Care | Payer: Medicare Other | Source: Ambulatory Visit | Attending: Oncology | Admitting: Oncology

## 2015-04-29 DIAGNOSIS — Z853 Personal history of malignant neoplasm of breast: Secondary | ICD-10-CM | POA: Diagnosis not present

## 2015-04-29 DIAGNOSIS — C50911 Malignant neoplasm of unspecified site of right female breast: Secondary | ICD-10-CM

## 2015-04-29 DIAGNOSIS — Z9889 Other specified postprocedural states: Secondary | ICD-10-CM | POA: Insufficient documentation

## 2015-12-03 DIAGNOSIS — C50411 Malignant neoplasm of upper-outer quadrant of right female breast: Secondary | ICD-10-CM | POA: Insufficient documentation

## 2015-12-03 NOTE — Progress Notes (Signed)
[place]  Telephone:(336) [phone] Fax:(336) [phone]  ID: Nancy Zhang OB: [birth year]  MR#: [id number]  OIN#:[id number]  No care team member to display  CHIEF COMPLAINT: Stage IIa ER/PR positive, HER-2 overexpressing adenocarcinoma of the upper outer quadrant of the right breast.  INTERVAL HISTORY: Patient returns to clinic today for routine evaluation.  She continues to feel well and remains asymptomatic. She completed 5 years of letrozole in July 2016. She has no neurologic complaints. She denies any recent fevers or illnesses. She has a good appetite and denies weight loss. She denies any pain. She has no chest pain or shortness of breath. She denies any nausea, vomiting, constipation, or diarrhea.  She has no urinary complaints. Patient offers no specific complaints today.   REVIEW OF SYSTEMS:   Review of Systems  Constitutional: Negative.  Negative for fever, malaise/fatigue and weight loss.  Respiratory: Negative.  Negative for cough and shortness of breath.   Cardiovascular: Negative.  Negative for chest pain and leg swelling.  Gastrointestinal: Negative.  Negative for abdominal pain.  Genitourinary: Negative.   Musculoskeletal: Negative.   Neurological: Negative.  Negative for sensory change and weakness.  Psychiatric/Behavioral: Negative.  The patient is not nervous/anxious.     As per HPI. Otherwise, a complete review of systems is negative.  PAST MEDICAL HISTORY: Past Medical History:  Diagnosis Date  . Breast cancer ([place]) 2010   right breast with chemo and radiation  . Diabetes ([place])   . Diverticulosis   . Hypercholesterolemia   . Hypertension   . Intention tremor     PAST SURGICAL HISTORY: Past Surgical History:  Procedure Laterality Date  . ABDOMINAL HYSTERECTOMY    . APPENDECTOMY    . BREAST EXCISIONAL BIOPSY Right 2010   positive    FAMILY HISTORY Family History  Problem Relation Age of Onset  . Breast cancer Daughter 19        ADVANCED DIRECTIVES:    HEALTH MAINTENANCE: Social History  Substance Use Topics  . Smoking status: Never Smoker  . Smokeless tobacco: Never Used  . Alcohol use No     Colonoscopy:  PAP:  Bone density:  Lipid panel:  Allergies  Allergen Reactions  . Shellfish Allergy Swelling    Current Outpatient Prescriptions  Medication Sig Dispense Refill  . amLODipine (NORVASC) 5 MG tablet Take 5 mg by mouth daily.     Marland Kitchen aspirin EC 81 MG tablet Take 81 mg by mouth daily.    Marland Kitchen atorvastatin (LIPITOR) 10 MG tablet Take 10 mg by mouth daily at 6 PM.     . hydrochlorothiazide (HYDRODIURIL) 25 MG tablet Take 25 mg by mouth daily.     . metFORMIN (GLUCOPHAGE-XR) 500 MG 24 hr tablet Take 500 mg by mouth 2 (two) times daily.     . quinapril (ACCUPRIL) 40 MG tablet Take 40 mg by mouth daily.     No current facility-administered medications for this visit.     OBJECTIVE: Vitals:   12/05/15 1109  BP: (!) 166/62  Pulse: (!) 101  Resp: 18  Temp: 97.2 F (36.2 C)     There is no height or weight on file to calculate BMI.    ECOG FS:0 - Asymptomatic  General: Well-developed, well-nourished, no acute distress. Eyes: Pink conjunctiva, anicteric sclera. Breasts: Bilateral breasts and axilla without lumps or masses. Lungs: Clear to auscultation bilaterally. Heart: Regular rate and rhythm. No rubs, murmurs, or gallops. Abdomen: Soft, nontender, nondistended. No organomegaly noted, normoactive bowel sounds.  Musculoskeletal: No edema, cyanosis, or clubbing. Neuro: Alert, answering all questions appropriately. Cranial nerves grossly intact. Skin: No rashes or petechiae noted. Psych: Normal affect.   LAB RESULTS:  Lab Results  Component Value Date   NA 141 01/19/2013   K 3.8 01/19/2013   CL 108 (H) 01/19/2013   CO2 31 01/19/2013   GLUCOSE 129 (H) 01/19/2013   BUN 10 01/19/2013   CREATININE 0.87 01/19/2013   CALCIUM 8.7 01/19/2013   PROT 5.3 (L) 01/15/2013   ALBUMIN 2.3 (L)  01/15/2013   AST 564 (H) 01/15/2013   ALT 146 (H) 01/15/2013   ALKPHOS 82 01/15/2013   BILITOT 0.7 01/15/2013   GFRNONAA >60 01/19/2013   GFRAA >60 01/19/2013    Lab Results  Component Value Date   WBC 6.5 01/19/2013   NEUTROABS 4.0 01/19/2013   HGB 11.1 (L) 01/19/2013   HCT 32.9 (L) 01/19/2013   MCV 84 01/19/2013   PLT 169 01/19/2013     STUDIES: No results found.  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  Patient's most recent mammogram on April 29, 2015 was reported as BI-RADS 2.  Repeat in February 2018.  Patient completed 5 years of letrozole in July 2016. No further intervention is needed at this time. Return to clinic in 1 year for routine evaluation. 2. Osteopenia: Patient's bone mineral density in August 2016 was reported with a T score of -2.4. This is essentially unchanged from previous. Continue calcium and vitamin D. Consider Fosamax in the future. Will repeat bone mineral density in February along with mammograms to assess for interval change. 3. Hypertension: Patient's blood pressure is mildly elevated today. Continue current medications. Treatment per PCP.  Patient expressed understanding and was in agreement with this plan. She also understands that She can call clinic at any time with any questions, concerns, or complaints.    Lloyd Huger, MD   12/06/2015 4:43 PM

## 2015-12-05 ENCOUNTER — Inpatient Hospital Stay: Payer: Medicare Other | Attending: Oncology | Admitting: Oncology

## 2015-12-05 DIAGNOSIS — Z9223 Personal history of estrogen therapy: Secondary | ICD-10-CM | POA: Insufficient documentation

## 2015-12-05 DIAGNOSIS — E7800 Pure hypercholesterolemia, unspecified: Secondary | ICD-10-CM | POA: Diagnosis not present

## 2015-12-05 DIAGNOSIS — C50411 Malignant neoplasm of upper-outer quadrant of right female breast: Secondary | ICD-10-CM

## 2015-12-05 DIAGNOSIS — I10 Essential (primary) hypertension: Secondary | ICD-10-CM | POA: Diagnosis not present

## 2015-12-05 DIAGNOSIS — E119 Type 2 diabetes mellitus without complications: Secondary | ICD-10-CM

## 2015-12-05 DIAGNOSIS — Z79899 Other long term (current) drug therapy: Secondary | ICD-10-CM | POA: Diagnosis not present

## 2015-12-05 DIAGNOSIS — Z7984 Long term (current) use of oral hypoglycemic drugs: Secondary | ICD-10-CM | POA: Diagnosis not present

## 2015-12-05 DIAGNOSIS — Z853 Personal history of malignant neoplasm of breast: Secondary | ICD-10-CM | POA: Insufficient documentation

## 2015-12-05 DIAGNOSIS — Z170 Estrogen receptor positive status [ER+]: Secondary | ICD-10-CM | POA: Diagnosis not present

## 2015-12-05 DIAGNOSIS — Z7982 Long term (current) use of aspirin: Secondary | ICD-10-CM | POA: Insufficient documentation

## 2015-12-05 DIAGNOSIS — M8580 Other specified disorders of bone density and structure, unspecified site: Secondary | ICD-10-CM | POA: Insufficient documentation

## 2015-12-05 NOTE — Progress Notes (Signed)
States is feeling well. Offers no complaints. 

## 2016-04-30 ENCOUNTER — Ambulatory Visit
Admission: RE | Admit: 2016-04-30 | Discharge: 2016-04-30 | Disposition: A | Discharge status: Home / Self Care | Payer: Medicare Other | Source: Ambulatory Visit | Attending: Oncology | Admitting: Oncology

## 2016-04-30 DIAGNOSIS — Z853 Personal history of malignant neoplasm of breast: Secondary | ICD-10-CM | POA: Insufficient documentation

## 2016-04-30 DIAGNOSIS — Z1231 Encounter for screening mammogram for malignant neoplasm of breast: Secondary | ICD-10-CM | POA: Diagnosis not present

## 2016-04-30 DIAGNOSIS — C50411 Malignant neoplasm of upper-outer quadrant of right female breast: Secondary | ICD-10-CM

## 2016-04-30 DIAGNOSIS — M810 Age-related osteoporosis without current pathological fracture: Secondary | ICD-10-CM | POA: Insufficient documentation

## 2016-04-30 DIAGNOSIS — Z13820 Encounter for screening for osteoporosis: Secondary | ICD-10-CM | POA: Insufficient documentation

## 2016-04-30 HISTORY — DX: Personal history of irradiation: Z92.3

## 2016-04-30 HISTORY — DX: Personal history of antineoplastic chemotherapy: Z92.21

## 2016-12-06 NOTE — Progress Notes (Signed)
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  INTERVAL HISTORY: Patient returns to clinic today for routine yearly evaluation.  She continues to feel well and remains asymptomatic. She completed 5 years of letrozole in July 2016. She has no neurologic complaints. She denies any recent fevers or illnesses. She has a good appetite and denies weight loss. She denies any pain. She has no chest pain or shortness of breath. She denies any nausea, vomiting, constipation, or diarrhea.  She has no urinary complaints. Patient offers no specific complaints today.   REVIEW OF SYSTEMS:   Review of Systems  Constitutional: Negative.  Negative for fever, malaise/fatigue and weight loss.  Respiratory: Negative.  Negative for cough and shortness of breath.   Cardiovascular: Negative.  Negative for chest pain and leg swelling.  Gastrointestinal: Negative.  Negative for abdominal pain.  Genitourinary: Negative.   Musculoskeletal: Negative.   Neurological: Negative.  Negative for sensory change and weakness.  Psychiatric/Behavioral: Negative.  The patient is not nervous/anxious.     As per HPI. Otherwise, a complete review of systems is negative.  PAST MEDICAL HISTORY: Past Medical History:  Diagnosis Date  . Breast cancer ([place]) 2010   RT LUMPECTOMY  . Diabetes ([place])   . Diverticulosis   . Hypercholesterolemia   . Hypertension   . Intention tremor   . Personal history of chemotherapy 2010   BREAST CA  . Personal history of radiation therapy 2010   BREAST CA    PAST SURGICAL HISTORY: Past Surgical History:  Procedure Laterality Date  . ABDOMINAL HYSTERECTOMY    . APPENDECTOMY    . BREAST  EXCISIONAL BIOPSY Right 2010   positive    FAMILY HISTORY Family History  Problem Relation Age of Onset  . Breast cancer Daughter 9       ADVANCED DIRECTIVES:    HEALTH MAINTENANCE: Social History  Substance Use Topics  . Smoking status: Never Smoker  . Smokeless tobacco: Never Used  . Alcohol use No     Colonoscopy:  PAP:  Bone density:  Lipid panel:  Allergies  Allergen Reactions  . Shellfish Allergy Swelling    Current Outpatient Prescriptions  Medication Sig Dispense Refill  . amLODipine (NORVASC) 5 MG tablet Take 5 mg by mouth daily.     Marland Kitchen aspirin EC 81 MG tablet Take 81 mg by mouth daily.    Marland Kitchen atorvastatin (LIPITOR) 10 MG tablet Take 10 mg by mouth daily at 6 PM.     . hydrochlorothiazide (HYDRODIURIL) 25 MG tablet Take 25 mg by mouth daily.     . metFORMIN (GLUCOPHAGE-XR) 500 MG 24 hr tablet Take 500 mg by mouth 2 (two) times daily.     . quinapril (ACCUPRIL) 40 MG tablet Take 40 mg by mouth daily.    Marland Kitchen alendronate (FOSAMAX) 70 MG tablet Take 1 tablet (70 mg total) by mouth once a week. Take with a full glass of water on an empty stomach. 4 tablet 12   No current facility-administered medications for this visit.     OBJECTIVE: Vitals:   12/07/16 1052  BP: (!) 153/66  Pulse: 89  Resp: 20  Temp: (!) 96.1 F (35.6 C)     There is no height or weight  on file to calculate BMI.    ECOG FS:0 - Asymptomatic  General: Well-developed, well-nourished, no acute distress. Eyes: Pink conjunctiva, anicteric sclera. Breasts: Bilateral breasts and axilla without lumps or masses. Lungs: Clear to auscultation bilaterally. Heart: Regular rate and rhythm. No rubs, murmurs, or gallops. Abdomen: Soft, nontender, nondistended. No organomegaly noted, normoactive bowel sounds. Musculoskeletal: No edema, cyanosis, or clubbing. Neuro: Alert, answering all questions appropriately. Cranial nerves grossly intact. Skin: No rashes or petechiae noted. Psych: Normal  affect.   LAB RESULTS:  Lab Results  Component Value Date   NA 141 01/19/2013   K 3.8 01/19/2013   CL 108 (H) 01/19/2013   CO2 31 01/19/2013   GLUCOSE 129 (H) 01/19/2013   BUN 10 01/19/2013   CREATININE 0.87 01/19/2013   CALCIUM 8.7 01/19/2013   PROT 5.3 (L) 01/15/2013   ALBUMIN 2.3 (L) 01/15/2013   AST 564 (H) 01/15/2013   ALT 146 (H) 01/15/2013   ALKPHOS 82 01/15/2013   BILITOT 0.7 01/15/2013   GFRNONAA >60 01/19/2013   GFRAA >60 01/19/2013    Lab Results  Component Value Date   WBC 6.5 01/19/2013   NEUTROABS 4.0 01/19/2013   HGB 11.1 (L) 01/19/2013   HCT 32.9 (L) 01/19/2013   MCV 84 01/19/2013   PLT 169 01/19/2013     STUDIES: No results found.  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  Patient's most recent mammogram on April 30, 2016 was reported as BI-RADS 1.  Repeat in February 2019.  Patient completed 5 years of letrozole in July 2016. No further intervention is needed at this time. Return to clinic in 1 year for routine evaluation. 2. Osteopenia: Patient's bone mineral density on April 30, 2016 reported a T score of -3.0 which is slightly worse than 2 years prior. Continue calcium, vitamin D, and Fosamax. Repeat in February 2019. 3. Hypertension: Patient's blood pressure is mildly elevated today. Continue current medications. Treatment per PCP.  Patient expressed understanding and was in agreement with this plan. She also understands that She can call clinic at any time with any questions, concerns, or complaints.    Lloyd Huger, MD   12/11/2016 9:07 AM

## 2016-12-07 ENCOUNTER — Inpatient Hospital Stay: Payer: Medicare Other | Attending: Oncology | Admitting: Oncology

## 2016-12-07 VITALS — BP 153/66 | HR 89 | Temp 96.1°F | Resp 20 | Wt 155.2 lb

## 2016-12-07 DIAGNOSIS — Z9223 Personal history of estrogen therapy: Secondary | ICD-10-CM | POA: Diagnosis not present

## 2016-12-07 DIAGNOSIS — Z7984 Long term (current) use of oral hypoglycemic drugs: Secondary | ICD-10-CM | POA: Insufficient documentation

## 2016-12-07 DIAGNOSIS — Z923 Personal history of irradiation: Secondary | ICD-10-CM | POA: Diagnosis not present

## 2016-12-07 DIAGNOSIS — C50411 Malignant neoplasm of upper-outer quadrant of right female breast: Secondary | ICD-10-CM

## 2016-12-07 DIAGNOSIS — Z853 Personal history of malignant neoplasm of breast: Secondary | ICD-10-CM | POA: Diagnosis not present

## 2016-12-07 DIAGNOSIS — Z79899 Other long term (current) drug therapy: Secondary | ICD-10-CM

## 2016-12-07 DIAGNOSIS — M8580 Other specified disorders of bone density and structure, unspecified site: Secondary | ICD-10-CM | POA: Diagnosis not present

## 2016-12-07 DIAGNOSIS — Z803 Family history of malignant neoplasm of breast: Secondary | ICD-10-CM | POA: Insufficient documentation

## 2016-12-07 DIAGNOSIS — Z170 Estrogen receptor positive status [ER+]: Secondary | ICD-10-CM | POA: Diagnosis not present

## 2016-12-07 DIAGNOSIS — Z7982 Long term (current) use of aspirin: Secondary | ICD-10-CM | POA: Insufficient documentation

## 2016-12-07 DIAGNOSIS — E119 Type 2 diabetes mellitus without complications: Secondary | ICD-10-CM | POA: Insufficient documentation

## 2016-12-07 DIAGNOSIS — E7800 Pure hypercholesterolemia, unspecified: Secondary | ICD-10-CM | POA: Diagnosis not present

## 2016-12-07 DIAGNOSIS — I10 Essential (primary) hypertension: Secondary | ICD-10-CM | POA: Insufficient documentation

## 2016-12-07 MED ORDER — ALENDRONATE SODIUM 70 MG PO TABS: 70.0000 mg | ORAL_TABLET | ORAL | 12 refills | Status: DC

## 2016-12-07 NOTE — Progress Notes (Signed)
Patient denies any concerns today.  

## 2017-05-03 ENCOUNTER — Ambulatory Visit
Admission: RE | Admit: 2017-05-03 | Discharge: 2017-05-03 | Disposition: A | Discharge status: Home / Self Care | Payer: Medicare Other | Source: Ambulatory Visit | Attending: Oncology | Admitting: Oncology

## 2017-05-03 DIAGNOSIS — Z1231 Encounter for screening mammogram for malignant neoplasm of breast: Secondary | ICD-10-CM | POA: Diagnosis not present

## 2017-05-03 DIAGNOSIS — Z0000 Encounter for general adult medical examination without abnormal findings: Secondary | ICD-10-CM | POA: Diagnosis present

## 2017-05-03 DIAGNOSIS — C50411 Malignant neoplasm of upper-outer quadrant of right female breast: Secondary | ICD-10-CM | POA: Diagnosis not present

## 2017-05-03 DIAGNOSIS — M810 Age-related osteoporosis without current pathological fracture: Secondary | ICD-10-CM | POA: Insufficient documentation

## 2017-12-05 NOTE — Progress Notes (Deleted)
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  INTERVAL HISTORY: Patient returns to clinic today for routine yearly evaluation.  She continues to feel well and remains asymptomatic. She completed 5 years of letrozole in July 2016. She has no neurologic complaints. She denies any recent fevers or illnesses. She has a good appetite and denies weight loss. She denies any pain. She has no chest pain or shortness of breath. She denies any nausea, vomiting, constipation, or diarrhea.  She has no urinary complaints. Patient offers no specific complaints today.   REVIEW OF SYSTEMS:   Review of Systems  Constitutional: Negative.  Negative for fever, malaise/fatigue and weight loss.  Respiratory: Negative.  Negative for cough and shortness of breath.   Cardiovascular: Negative.  Negative for chest pain and leg swelling.  Gastrointestinal: Negative.  Negative for abdominal pain.  Genitourinary: Negative.   Musculoskeletal: Negative.   Neurological: Negative.  Negative for sensory change and weakness.  Psychiatric/Behavioral: Negative.  The patient is not nervous/anxious.     As per HPI. Otherwise, a complete review of systems is negative.  PAST MEDICAL HISTORY: Past Medical History:  Diagnosis Date  . Breast cancer ([place]) 2010   RT LUMPECTOMY  . Diabetes ([place])   . Diverticulosis   . Hypercholesterolemia   . Hypertension   . Intention tremor   . Personal history of chemotherapy 2010   BREAST CA  . Personal history of radiation therapy 2010   BREAST CA    PAST SURGICAL HISTORY: Past Surgical History:  Procedure Laterality Date  . ABDOMINAL HYSTERECTOMY    . APPENDECTOMY    . BREAST  EXCISIONAL BIOPSY Right 2010   positive    FAMILY HISTORY Family History  Problem Relation Age of Onset  . Breast cancer Daughter 47       ADVANCED DIRECTIVES:    HEALTH MAINTENANCE: Social History   Tobacco Use  . Smoking status: Never Smoker  . Smokeless tobacco: Never Used  Substance Use Topics  . Alcohol use: No  . Drug use: No     Colonoscopy:  PAP:  Bone density:  Lipid panel:  Allergies  Allergen Reactions  . Shellfish Allergy Swelling    Current Outpatient Medications  Medication Sig Dispense Refill  . alendronate (FOSAMAX) 70 MG tablet Take 1 tablet (70 mg total) by mouth once a week. Take with a full glass of water on an empty stomach. 4 tablet 12  . amLODipine (NORVASC) 5 MG tablet Take 5 mg by mouth daily.     Marland Kitchen aspirin EC 81 MG tablet Take 81 mg by mouth daily.    Marland Kitchen atorvastatin (LIPITOR) 10 MG tablet Take 10 mg by mouth daily at 6 PM.     . hydrochlorothiazide (HYDRODIURIL) 25 MG tablet Take 25 mg by mouth daily.     . metFORMIN (GLUCOPHAGE-XR) 500 MG 24 hr tablet Take 500 mg by mouth 2 (two) times daily.     . quinapril (ACCUPRIL) 40 MG tablet Take 40 mg by mouth daily.     No current facility-administered medications for this visit.     OBJECTIVE: There were no vitals filed for this visit.   There is no height or weight on file to calculate BMI.  ECOG FS:0 - Asymptomatic  General: Well-developed, well-nourished, no acute distress. Eyes: Pink conjunctiva, anicteric sclera. Breasts: Bilateral breasts and axilla without lumps or masses. Lungs: Clear to auscultation bilaterally. Heart: Regular rate and rhythm. No rubs, murmurs, or gallops. Abdomen: Soft, nontender, nondistended. No organomegaly noted, normoactive bowel sounds. Musculoskeletal: No edema, cyanosis, or clubbing. Neuro: Alert, answering all questions appropriately. Cranial nerves grossly intact. Skin: No rashes or petechiae noted. Psych: Normal affect.   LAB RESULTS:  Lab  Results  Component Value Date   NA 141 01/19/2013   K 3.8 01/19/2013   CL 108 (H) 01/19/2013   CO2 31 01/19/2013   GLUCOSE 129 (H) 01/19/2013   BUN 10 01/19/2013   CREATININE 0.87 01/19/2013   CALCIUM 8.7 01/19/2013   PROT 5.3 (L) 01/15/2013   ALBUMIN 2.3 (L) 01/15/2013   AST 564 (H) 01/15/2013   ALT 146 (H) 01/15/2013   ALKPHOS 82 01/15/2013   BILITOT 0.7 01/15/2013   GFRNONAA >60 01/19/2013   GFRAA >60 01/19/2013    Lab Results  Component Value Date   WBC 6.5 01/19/2013   NEUTROABS 4.0 01/19/2013   HGB 11.1 (L) 01/19/2013   HCT 32.9 (L) 01/19/2013   MCV 84 01/19/2013   PLT 169 01/19/2013     STUDIES: No results found.  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  Patient's most recent mammogram on April 30, 2016 was reported as BI-RADS 1.  Repeat in February 2019.  Patient completed 5 years of letrozole in July 2016. No further intervention is needed at this time. Return to clinic in 1 year for routine evaluation. 2. Osteopenia: Patient's bone mineral density on April 30, 2016 reported a T score of -3.0 which is slightly worse than 2 years prior. Continue calcium, vitamin D, and Fosamax. Repeat in February 2019. 3. Hypertension: Patient's blood pressure is mildly elevated today. Continue current medications. Treatment per PCP.  Patient expressed understanding and was in agreement with this plan. She also understands that She can call clinic at any time with any questions, concerns, or complaints.    Lloyd Huger, MD   12/05/2017 9:48 PM

## 2017-12-06 ENCOUNTER — Inpatient Hospital Stay: Payer: Medicare Other | Admitting: Oncology

## 2017-12-19 NOTE — Progress Notes (Signed)
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  INTERVAL HISTORY: Patient returns to clinic today for routine yearly evaluation.  She continues to feel well and remains asymptomatic. She has no neurologic complaints. She denies any recent fevers or illnesses. She has a good appetite and denies weight loss. She denies any pain. She has no chest pain or shortness of breath. She denies any nausea, vomiting, constipation, or diarrhea.  She has no urinary complaints.  Patient feels at her baseline offers no specific today.  REVIEW OF SYSTEMS:   Review of Systems  Constitutional: Negative.  Negative for fever, malaise/fatigue and weight loss.  Respiratory: Negative.  Negative for cough and shortness of breath.   Cardiovascular: Negative.  Negative for chest pain and leg swelling.  Gastrointestinal: Negative.  Negative for abdominal pain.  Genitourinary: Negative.  Negative for dysuria.  Musculoskeletal: Negative.  Negative for back pain.  Skin: Negative.  Negative for rash.  Neurological: Negative.  Negative for dizziness, sensory change, focal weakness, weakness and headaches.  Psychiatric/Behavioral: Negative.  The patient is not nervous/anxious.     As per HPI. Otherwise, a complete review of systems is negative.  PAST MEDICAL HISTORY: Past Medical History:  Diagnosis Date  . Breast cancer ([place]) 2010   RT LUMPECTOMY  . Diabetes ([place])   . Diverticulosis   . Hypercholesterolemia   . Hypertension   . Intention tremor   . Personal history of chemotherapy 2010   BREAST CA  . Personal history of radiation therapy 2010   BREAST CA    PAST SURGICAL HISTORY: Past Surgical History:    Procedure Laterality Date  . ABDOMINAL HYSTERECTOMY    . APPENDECTOMY    . BREAST EXCISIONAL BIOPSY Right 2010   positive    FAMILY HISTORY Family History  Problem Relation Age of Onset  . Breast cancer Daughter 38       ADVANCED DIRECTIVES:    HEALTH MAINTENANCE: Social History   Tobacco Use  . Smoking status: Never Smoker  . Smokeless tobacco: Never Used  Substance Use Topics  . Alcohol use: No  . Drug use: No     Colonoscopy:  PAP:  Bone density:  Lipid panel:  Allergies  Allergen Reactions  . Shellfish Allergy Swelling    Current Outpatient Medications  Medication Sig Dispense Refill  . amLODipine (NORVASC) 5 MG tablet Take 5 mg by mouth daily.     Marland Kitchen atorvastatin (LIPITOR) 10 MG tablet Take 10 mg by mouth daily at 6 PM.     . hydrochlorothiazide (HYDRODIURIL) 25 MG tablet Take 25 mg by mouth daily.     . metFORMIN (GLUCOPHAGE-XR) 500 MG 24 hr tablet Take 500 mg by mouth 2 (two) times daily.     . quinapril (ACCUPRIL) 40 MG tablet Take 40 mg by mouth daily.     No current facility-administered medications for this visit.     OBJECTIVE: Vitals:   12/20/17 1435 12/20/17 1443  BP:  118/69  Pulse:  99  Resp: 20   Temp:  98.5 F (36.9 C)     Body mass index is 28.17 kg/m.    ECOG FS:0 - Asymptomatic  General: Well-developed, well-nourished, no acute distress. Eyes: Pink conjunctiva, anicteric sclera. HEENT: Normocephalic,  moist mucous membranes. Breast: Patient refused breast exam today. Lungs: Clear to auscultation bilaterally. Heart: Regular rate and rhythm. No rubs, murmurs, or gallops. Abdomen: Soft, nontender, nondistended. No organomegaly noted, normoactive bowel sounds. Musculoskeletal: No edema, cyanosis, or clubbing. Neuro: Alert, answering all questions appropriately. Cranial nerves grossly intact. Skin: No rashes or petechiae noted. Psych: Normal affect.  LAB RESULTS:  Lab Results  Component Value Date   NA 141 01/19/2013   K 3.8  01/19/2013   CL 108 (H) 01/19/2013   CO2 31 01/19/2013   GLUCOSE 129 (H) 01/19/2013   BUN 10 01/19/2013   CREATININE 0.87 01/19/2013   CALCIUM 8.7 01/19/2013   PROT 5.3 (L) 01/15/2013   ALBUMIN 2.3 (L) 01/15/2013   AST 564 (H) 01/15/2013   ALT 146 (H) 01/15/2013   ALKPHOS 82 01/15/2013   BILITOT 0.7 01/15/2013   GFRNONAA >60 01/19/2013   GFRAA >60 01/19/2013    Lab Results  Component Value Date   WBC 6.5 01/19/2013   NEUTROABS 4.0 01/19/2013   HGB 11.1 (L) 01/19/2013   HCT 32.9 (L) 01/19/2013   MCV 84 01/19/2013   PLT 169 01/19/2013     STUDIES: No results found.  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  Patient completed 5 years of letrozole in July 2016.  Her most recent mammogram on May 03, 2017 was reported as BI-RADS 1.  Repeat in February 2020.  No further intervention is needed. 2.  Osteoporosis: Patient's most recent bone mineral density on May 03, 2017 reported T score of -3.2 which is slightly worse than 1 year prior when her T score was reported -3.0.  Repeat DEXA scan in February 2020.  Patient discontinued Fosamax secondary to GI upset.  She also does not take calcium and vitamin D supplementation and report.  After lengthy discussion with the patient, she is agreed to pursue treatment with Prolia and will return to clinic in 1 week for laboratory work and treatment.  Patient will then return to clinic in 6 months for further evaluation and continuation of Prolia.  I spent a total of 30 minutes face-to-face with the patient of which greater than 50% of the visit was spent in counseling and coordination of care as detailed above.  Patient expressed understanding and was in agreement with this plan. She also understands that She  can call clinic at any time with any questions, concerns, or complaints.    Lloyd Huger, MD   12/21/2017 10:02 AM

## 2017-12-20 ENCOUNTER — Other Ambulatory Visit: Payer: Self-pay

## 2017-12-20 ENCOUNTER — Encounter: Payer: Self-pay | Admitting: Oncology

## 2017-12-20 ENCOUNTER — Inpatient Hospital Stay: Payer: Medicare Other | Attending: Oncology | Admitting: Oncology

## 2017-12-20 VITALS — BP 118/69 | HR 99 | Temp 98.5°F | Resp 20 | Ht 62.0 in | Wt 154.0 lb

## 2017-12-20 DIAGNOSIS — M810 Age-related osteoporosis without current pathological fracture: Secondary | ICD-10-CM | POA: Diagnosis not present

## 2017-12-20 DIAGNOSIS — Z853 Personal history of malignant neoplasm of breast: Secondary | ICD-10-CM | POA: Diagnosis not present

## 2017-12-20 DIAGNOSIS — C50411 Malignant neoplasm of upper-outer quadrant of right female breast: Secondary | ICD-10-CM

## 2017-12-20 NOTE — Progress Notes (Signed)
Patient here for follow up no changes since last appointment 

## 2017-12-21 DIAGNOSIS — M810 Age-related osteoporosis without current pathological fracture: Secondary | ICD-10-CM | POA: Insufficient documentation

## 2017-12-27 ENCOUNTER — Inpatient Hospital Stay: Payer: Medicare Other

## 2017-12-27 DIAGNOSIS — M810 Age-related osteoporosis without current pathological fracture: Secondary | ICD-10-CM

## 2017-12-27 DIAGNOSIS — Z853 Personal history of malignant neoplasm of breast: Secondary | ICD-10-CM | POA: Diagnosis not present

## 2017-12-27 LAB — BASIC METABOLIC PANEL
Anion gap: 8 (ref 5–15)
BUN: 21 mg/dL (ref 8–23)
CHLORIDE: 101 mmol/L (ref 98–111)
CO2: 27 mmol/L (ref 22–32)
Calcium: 8.9 mg/dL (ref 8.9–10.3)
Creatinine, Ser: 1.02 mg/dL — ABNORMAL HIGH (ref 0.44–1.00)
GFR calc Af Amer: 54 mL/min — ABNORMAL LOW (ref >60)
GFR, EST NON AFRICAN AMERICAN: 47 mL/min — AB (ref >60)
GLUCOSE: 137 mg/dL — AB (ref 70–99)
POTASSIUM: 3.4 mmol/L — AB (ref 3.5–5.1)
Sodium: 136 mmol/L (ref 135–145)

## 2017-12-27 MED ORDER — DENOSUMAB 60 MG/ML ~~LOC~~ SOSY
60.0000 mg | PREFILLED_SYRINGE | Freq: Once | SUBCUTANEOUS | Status: AC
  Administered 2017-12-27: 60 mg via SUBCUTANEOUS
  Filled 2017-12-27: qty 1

## 2018-05-04 ENCOUNTER — Ambulatory Visit
Admission: RE | Admit: 2018-05-04 | Discharge: 2018-05-04 | Disposition: A | Discharge status: Home / Self Care | Payer: Medicare Other | Source: Ambulatory Visit | Attending: Oncology | Admitting: Oncology

## 2018-05-04 DIAGNOSIS — C50411 Malignant neoplasm of upper-outer quadrant of right female breast: Secondary | ICD-10-CM | POA: Diagnosis present

## 2018-05-04 DIAGNOSIS — M85832 Other specified disorders of bone density and structure, left forearm: Secondary | ICD-10-CM | POA: Insufficient documentation

## 2018-05-04 DIAGNOSIS — M85851 Other specified disorders of bone density and structure, right thigh: Secondary | ICD-10-CM | POA: Diagnosis not present

## 2018-05-04 DIAGNOSIS — M85852 Other specified disorders of bone density and structure, left thigh: Secondary | ICD-10-CM | POA: Insufficient documentation

## 2018-05-04 DIAGNOSIS — M810 Age-related osteoporosis without current pathological fracture: Secondary | ICD-10-CM | POA: Insufficient documentation

## 2018-05-04 DIAGNOSIS — Z1231 Encounter for screening mammogram for malignant neoplasm of breast: Secondary | ICD-10-CM | POA: Diagnosis not present

## 2018-05-04 DIAGNOSIS — Z853 Personal history of malignant neoplasm of breast: Secondary | ICD-10-CM | POA: Insufficient documentation

## 2018-06-28 ENCOUNTER — Ambulatory Visit: Payer: Medicare Other | Admitting: Oncology

## 2018-06-28 ENCOUNTER — Ambulatory Visit: Payer: Medicare Other

## 2018-06-28 ENCOUNTER — Other Ambulatory Visit: Payer: Medicare Other

## 2018-07-07 ENCOUNTER — Other Ambulatory Visit: Payer: Self-pay

## 2018-07-07 ENCOUNTER — Inpatient Hospital Stay
Admission: EM | Admit: 2018-07-07 | Discharge: 2018-07-11 | DRG: 481 | Disposition: A | Discharge status: Skilled Nursing Facility | Payer: Medicare Other | Attending: Internal Medicine | Admitting: Internal Medicine

## 2018-07-07 ENCOUNTER — Emergency Department: Payer: Medicare Other

## 2018-07-07 DIAGNOSIS — Z803 Family history of malignant neoplasm of breast: Secondary | ICD-10-CM

## 2018-07-07 DIAGNOSIS — Z7984 Long term (current) use of oral hypoglycemic drugs: Secondary | ICD-10-CM | POA: Diagnosis not present

## 2018-07-07 DIAGNOSIS — E119 Type 2 diabetes mellitus without complications: Secondary | ICD-10-CM | POA: Diagnosis present

## 2018-07-07 DIAGNOSIS — Z923 Personal history of irradiation: Secondary | ICD-10-CM

## 2018-07-07 DIAGNOSIS — B9620 Unspecified Escherichia coli [E. coli] as the cause of diseases classified elsewhere: Secondary | ICD-10-CM | POA: Diagnosis present

## 2018-07-07 DIAGNOSIS — Z9221 Personal history of antineoplastic chemotherapy: Secondary | ICD-10-CM

## 2018-07-07 DIAGNOSIS — S72142A Displaced intertrochanteric fracture of left femur, initial encounter for closed fracture: Principal | ICD-10-CM | POA: Diagnosis present

## 2018-07-07 DIAGNOSIS — Z853 Personal history of malignant neoplasm of breast: Secondary | ICD-10-CM

## 2018-07-07 DIAGNOSIS — Z1159 Encounter for screening for other viral diseases: Secondary | ICD-10-CM

## 2018-07-07 DIAGNOSIS — Z79899 Other long term (current) drug therapy: Secondary | ICD-10-CM

## 2018-07-07 DIAGNOSIS — Z419 Encounter for procedure for purposes other than remedying health state, unspecified: Secondary | ICD-10-CM

## 2018-07-07 DIAGNOSIS — E860 Dehydration: Secondary | ICD-10-CM | POA: Diagnosis present

## 2018-07-07 DIAGNOSIS — E7800 Pure hypercholesterolemia, unspecified: Secondary | ICD-10-CM | POA: Diagnosis present

## 2018-07-07 DIAGNOSIS — E785 Hyperlipidemia, unspecified: Secondary | ICD-10-CM | POA: Diagnosis present

## 2018-07-07 DIAGNOSIS — R0902 Hypoxemia: Secondary | ICD-10-CM

## 2018-07-07 DIAGNOSIS — W010XXA Fall on same level from slipping, tripping and stumbling without subsequent striking against object, initial encounter: Secondary | ICD-10-CM | POA: Diagnosis present

## 2018-07-07 DIAGNOSIS — N3000 Acute cystitis without hematuria: Secondary | ICD-10-CM | POA: Diagnosis present

## 2018-07-07 DIAGNOSIS — I10 Essential (primary) hypertension: Secondary | ICD-10-CM | POA: Diagnosis present

## 2018-07-07 DIAGNOSIS — Y92009 Unspecified place in unspecified non-institutional (private) residence as the place of occurrence of the external cause: Secondary | ICD-10-CM

## 2018-07-07 DIAGNOSIS — S72002A Fracture of unspecified part of neck of left femur, initial encounter for closed fracture: Secondary | ICD-10-CM | POA: Diagnosis present

## 2018-07-07 DIAGNOSIS — W19XXXA Unspecified fall, initial encounter: Secondary | ICD-10-CM

## 2018-07-07 LAB — COMPREHENSIVE METABOLIC PANEL
ALT: 12 U/L (ref 0–44)
AST: 17 U/L (ref 15–41)
Albumin: 4 g/dL (ref 3.5–5.0)
Alkaline Phosphatase: 75 U/L (ref 38–126)
Anion gap: 13 (ref 5–15)
BUN: 27 mg/dL — ABNORMAL HIGH (ref 8–23)
CO2: 26 mmol/L (ref 22–32)
Calcium: 9.5 mg/dL (ref 8.9–10.3)
Chloride: 101 mmol/L (ref 98–111)
Creatinine, Ser: 1.08 mg/dL — ABNORMAL HIGH (ref 0.44–1.00)
GFR calc Af Amer: 52 mL/min — ABNORMAL LOW (ref >60)
GFR calc non Af Amer: 45 mL/min — ABNORMAL LOW (ref >60)
Glucose, Bld: 231 mg/dL — ABNORMAL HIGH (ref 70–99)
Potassium: 3.8 mmol/L (ref 3.5–5.1)
Sodium: 140 mmol/L (ref 135–145)
Total Bilirubin: 0.4 mg/dL (ref 0.3–1.2)
Total Protein: 7.4 g/dL (ref 6.5–8.1)

## 2018-07-07 LAB — PROTIME-INR
INR: 1 (ref 0.8–1.2)
Prothrombin Time: 12.6 seconds (ref 11.4–15.2)

## 2018-07-07 LAB — CBC WITH DIFFERENTIAL/PLATELET
Abs Immature Granulocytes: 0.03 10*3/uL (ref 0.00–0.07)
Basophils Absolute: 0 10*3/uL (ref 0.0–0.1)
Basophils Relative: 0 %
Eosinophils Absolute: 0.1 10*3/uL (ref 0.0–0.5)
Eosinophils Relative: 1 %
HCT: 38.4 % (ref 36.0–46.0)
Hemoglobin: 12.3 g/dL (ref 12.0–15.0)
Immature Granulocytes: 0 %
Lymphocytes Relative: 23 %
Lymphs Abs: 2.1 10*3/uL (ref 0.7–4.0)
MCH: 28.4 pg (ref 26.0–34.0)
MCHC: 32 g/dL (ref 30.0–36.0)
MCV: 88.7 fL (ref 80.0–100.0)
Monocytes Absolute: 0.7 10*3/uL (ref 0.1–1.0)
Monocytes Relative: 7 %
Neutro Abs: 6.3 10*3/uL (ref 1.7–7.7)
Neutrophils Relative %: 69 %
Platelets: 262 10*3/uL (ref 150–400)
RBC: 4.33 MIL/uL (ref 3.87–5.11)
RDW: 13.2 % (ref 11.5–15.5)
WBC: 9.2 10*3/uL (ref 4.0–10.5)
nRBC: 0 % (ref 0.0–0.2)

## 2018-07-07 LAB — TYPE AND SCREEN
ABO/RH(D): A POS
Antibody Screen: NEGATIVE

## 2018-07-07 MED ORDER — ONDANSETRON HCL 4 MG/2ML IJ SOLN
4.0000 mg | Freq: Once | INTRAMUSCULAR | Status: AC
  Administered 2018-07-07: 4 mg via INTRAVENOUS
  Filled 2018-07-07: qty 2

## 2018-07-07 MED ORDER — ACETAMINOPHEN 650 MG RE SUPP: 650.0000 mg | Freq: Four times a day (QID) | RECTAL | Status: DC | PRN

## 2018-07-07 MED ORDER — FENTANYL CITRATE (PF) 100 MCG/2ML IJ SOLN
50.0000 ug | Freq: Once | INTRAMUSCULAR | Status: AC
  Administered 2018-07-07: 20:00:00 50 ug via INTRAVENOUS
  Filled 2018-07-07: qty 2

## 2018-07-07 MED ORDER — ONDANSETRON HCL 4 MG PO TABS: 4.0000 mg | ORAL_TABLET | Freq: Four times a day (QID) | ORAL | Status: DC | PRN

## 2018-07-07 MED ORDER — POLYETHYLENE GLYCOL 3350 17 G PO PACK: 17.0000 g | PACK | Freq: Every day | ORAL | Status: DC | PRN

## 2018-07-07 MED ORDER — INSULIN ASPART 100 UNIT/ML ~~LOC~~ SOLN: 0.0000 [IU] | Freq: Every day | SUBCUTANEOUS | Status: DC

## 2018-07-07 MED ORDER — HYDRALAZINE HCL 20 MG/ML IJ SOLN: 5.0000 mg | INTRAMUSCULAR | Status: DC | PRN

## 2018-07-07 MED ORDER — ONDANSETRON HCL 4 MG/2ML IJ SOLN
4.0000 mg | Freq: Once | INTRAMUSCULAR | Status: AC
  Administered 2018-07-07: 20:00:00 4 mg via INTRAVENOUS
  Filled 2018-07-07: qty 2

## 2018-07-07 MED ORDER — AMLODIPINE BESYLATE 5 MG PO TABS
5.0000 mg | ORAL_TABLET | Freq: Every day | ORAL | Status: DC
  Administered 2018-07-09 – 2018-07-10 (×2): 5 mg via ORAL
  Filled 2018-07-07 (×2): qty 1

## 2018-07-07 MED ORDER — INSULIN ASPART 100 UNIT/ML ~~LOC~~ SOLN
0.0000 [IU] | Freq: Three times a day (TID) | SUBCUTANEOUS | Status: DC
  Administered 2018-07-08 (×2): 2 [IU] via SUBCUTANEOUS
  Administered 2018-07-09: 17:00:00 1 [IU] via SUBCUTANEOUS
  Administered 2018-07-09 (×2): 2 [IU] via SUBCUTANEOUS
  Administered 2018-07-10 – 2018-07-11 (×4): 1 [IU] via SUBCUTANEOUS
  Filled 2018-07-07 (×9): qty 1

## 2018-07-07 MED ORDER — QUINAPRIL HCL 10 MG PO TABS
40.0000 mg | ORAL_TABLET | Freq: Every day | ORAL | Status: DC
  Administered 2018-07-09 – 2018-07-10 (×2): 40 mg via ORAL
  Filled 2018-07-07 (×3): qty 4

## 2018-07-07 MED ORDER — ONDANSETRON HCL 4 MG/2ML IJ SOLN
4.0000 mg | Freq: Four times a day (QID) | INTRAMUSCULAR | Status: DC | PRN
  Administered 2018-07-08: 4 mg via INTRAVENOUS
  Filled 2018-07-07: qty 2

## 2018-07-07 MED ORDER — ACETAMINOPHEN 325 MG PO TABS: 650.0000 mg | ORAL_TABLET | Freq: Four times a day (QID) | ORAL | Status: DC | PRN

## 2018-07-07 MED ORDER — OXYCODONE HCL 5 MG PO TABS: 5.0000 mg | ORAL_TABLET | ORAL | Status: DC | PRN

## 2018-07-07 MED ORDER — MORPHINE SULFATE (PF) 2 MG/ML IV SOLN: 2.0000 mg | INTRAVENOUS | Status: DC | PRN

## 2018-07-07 MED ORDER — FENTANYL CITRATE (PF) 100 MCG/2ML IJ SOLN
50.0000 ug | INTRAMUSCULAR | Status: DC | PRN
  Administered 2018-07-07: 50 ug via INTRAVENOUS

## 2018-07-07 MED ORDER — HYDROCHLOROTHIAZIDE 25 MG PO TABS: 25.0000 mg | ORAL_TABLET | Freq: Every day | ORAL | Status: DC

## 2018-07-07 MED ORDER — FENTANYL CITRATE (PF) 100 MCG/2ML IJ SOLN
INTRAMUSCULAR | Status: AC
  Filled 2018-07-07: qty 2

## 2018-07-07 MED ORDER — ATORVASTATIN CALCIUM 20 MG PO TABS
10.0000 mg | ORAL_TABLET | Freq: Every day | ORAL | Status: DC
  Administered 2018-07-08 – 2018-07-10 (×3): 10 mg via ORAL
  Filled 2018-07-07 (×3): qty 1

## 2018-07-07 MED ORDER — CEFAZOLIN SODIUM-DEXTROSE 1-4 GM/50ML-% IV SOLN
1.0000 g | Freq: Once | INTRAVENOUS | Status: AC
  Administered 2018-07-08: 12:00:00 1 g via INTRAVENOUS
  Filled 2018-07-07 (×2): qty 50

## 2018-07-07 NOTE — ED Notes (Signed)
ABX given to or tech,

## 2018-07-07 NOTE — ED Notes (Signed)
Patient of unit to 1C

## 2018-07-07 NOTE — ED Notes (Signed)
Report called and given to receiving nurse. Patient in bed comfortable at present time.

## 2018-07-07 NOTE — ED Provider Notes (Signed)
[facility] Emergency Department Provider Note    First MD Initiated Contact with Patient 07/07/18 1916     (approximate)  I have reviewed the triage vital signs and the nursing notes.   HISTORY  Chief Complaint Hip Pain and Fall    HPI Nancy Zhang is a 85 y.o. female listed past medical history not on any blood thinners presents to the ER after mechanical fall.  States that she was up going to the refrigerator for a drink of water and slipped and fell landing on her left hip.  Denies hitting her head.  No neck pain.  No headache.  States the pain is mild to moderate.  Unable to ambulate after the accident.    Past Medical History:  Diagnosis Date  . Breast cancer ([place]) 2010   RT LUMPECTOMY  . Diabetes ([place])   . Diverticulosis   . Hypercholesterolemia   . Hypertension   . Intention tremor   . Personal history of chemotherapy 2010   BREAST CA  . Personal history of radiation therapy 2010   BREAST CA   Family History  Problem Relation Age of Onset  . Breast cancer Daughter 61   Past Surgical History:  Procedure Laterality Date  . ABDOMINAL HYSTERECTOMY    . APPENDECTOMY    . BREAST EXCISIONAL BIOPSY Right 2010   positive   Patient Active Problem List   Diagnosis Date Noted  . Osteoporosis 12/21/2017  . 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 Authorizing Provider  amLODipine (NORVASC) 5 MG tablet Take 5 mg by mouth daily.     [provider]  atorvastatin (LIPITOR) 10 MG tablet Take 10 mg by mouth daily at 6 PM.     [provider]  hydrochlorothiazide (HYDRODIURIL) 25 MG tablet Take 25 mg by mouth daily.     [provider]  metFORMIN (GLUCOPHAGE-XR) 500 MG 24 hr tablet Take 500 mg by mouth 2 (two) times daily.  06/22/13   [provider]  quinapril (ACCUPRIL) 40 MG tablet Take  40 mg by mouth daily.    [provider]    Allergies Shellfish allergy    Social History Social History   Tobacco Use  . Smoking status: Never Smoker  . Smokeless tobacco: Never Used  Substance Use Topics  . Alcohol use: No  . Drug use: No    Review of Systems Patient denies headaches, rhinorrhea, blurry vision, numbness, shortness of breath, chest pain, edema, cough, abdominal pain, nausea, vomiting, diarrhea, dysuria, fevers, rashes or hallucinations unless otherwise stated above in HPI. ____________________________________________   PHYSICAL EXAM:  VITAL SIGNS: Vitals:   07/07/18 1907  BP: (!) 172/81  Pulse: 81  Resp: (!) 24  Temp: 98.4 F (36.9 C)  SpO2: 98%    Constitutional: Alert and oriented.  Eyes: Conjunctivae are normal.  Head: Atraumatic. Nose: No congestion/rhinnorhea. Mouth/Throat: Mucous membranes are moist.   Neck: No stridor. Painless ROM.  Cardiovascular: Normal rate, regular rhythm. Grossly normal heart sounds.  Good peripheral circulation. Respiratory: Normal respiratory effort.  No retractions. Lungs CTAB. Gastrointestinal: Soft and nontender. No distention. No abdominal bruits. No CVA tenderness. Genitourinary: deferred Musculoskeletal: left leg shortened and rotated,  nv intact distally,  No joint effusions. Neurologic:  Normal speech and language. No gross focal neurologic deficits are appreciated. No facial droop Skin:  Skin  is warm, dry and intact. No rash noted. Psychiatric: Mood and affect are normal. Speech and behavior are normal.  ____________________________________________   LABS (all labs ordered are listed, but only abnormal results are displayed)  Results for orders placed or performed during the hospital encounter of 07/07/18 (from the past 24 hour(s))  CBC with Differential/Platelet     Status: None   Collection Time: 07/07/18  7:22 PM  Result Value Ref Range   WBC 9.2 4.0 - 10.5 K/uL   RBC 4.33 3.87 - 5.11  MIL/uL   Hemoglobin 12.3 12.0 - 15.0 g/dL   HCT 38.4 36.0 - 46.0 %   MCV 88.7 80.0 - 100.0 fL   MCH 28.4 26.0 - 34.0 pg   MCHC 32.0 30.0 - 36.0 g/dL   RDW 13.2 11.5 - 15.5 %   Platelets 262 150 - 400 K/uL   nRBC 0.0 0.0 - 0.2 %   Neutrophils Relative % 69 %   Neutro Abs 6.3 1.7 - 7.7 K/uL   Lymphocytes Relative 23 %   Lymphs Abs 2.1 0.7 - 4.0 K/uL   Monocytes Relative 7 %   Monocytes Absolute 0.7 0.1 - 1.0 K/uL   Eosinophils Relative 1 %   Eosinophils Absolute 0.1 0.0 - 0.5 K/uL   Basophils Relative 0 %   Basophils Absolute 0.0 0.0 - 0.1 K/uL   Immature Granulocytes 0 %   Abs Immature Granulocytes 0.03 0.00 - 0.07 K/uL  Comprehensive metabolic panel     Status: Abnormal   Collection Time: 07/07/18  7:22 PM  Result Value Ref Range   Sodium 140 135 - 145 mmol/L   Potassium 3.8 3.5 - 5.1 mmol/L   Chloride 101 98 - 111 mmol/L   CO2 26 22 - 32 mmol/L   Glucose, Bld 231 (H) 70 - 99 mg/dL   BUN 27 (H) 8 - 23 mg/dL   Creatinine, Ser 1.08 (H) 0.44 - 1.00 mg/dL   Calcium 9.5 8.9 - 10.3 mg/dL   Total Protein 7.4 6.5 - 8.1 g/dL   Albumin 4.0 3.5 - 5.0 g/dL   AST 17 15 - 41 U/L   ALT 12 0 - 44 U/L   Alkaline Phosphatase 75 38 - 126 U/L   Total Bilirubin 0.4 0.3 - 1.2 mg/dL   GFR calc non Af Amer 45 (L) >60 mL/min   GFR calc Af Amer 52 (L) >60 mL/min   Anion gap 13 5 - 15   ____________________________________________  EKG My review and personal interpretation at Time: 19:06   Indication: hip fracture  Rate: 85  Rhythm: sinus Axis: normal Other: normal intervals, no stemi ____________________________________________  RADIOLOGY  I personally reviewed all radiographic images ordered to evaluate for the above acute complaints and reviewed radiology reports and findings.  These findings were personally discussed with the patient.  Please see medical record for radiology report.  ____________________________________________   PROCEDURES  Procedure(s) performed:  Procedures     Critical Care performed: no ____________________________________________   INITIAL IMPRESSION / ASSESSMENT AND PLAN / ED COURSE  Pertinent labs & imaging results that were available during my care of the patient were reviewed by me and considered in my medical decision making (see chart for details).   DDX: fracture, contusion, dislocation  Nancy Zhang is a 85 y.o. who presents to the ED with evidence of left hip fracture after mechanical fall.  Not on any anticoagulation.  Discussed with Dr. Posey Pronto of orthopedics.  Patient will be admitted to the  hospital for further medical management.  She was given IV pain medication.     The patient was evaluated in Emergency Department today for the symptoms described in the history of present illness. He/she was evaluated in the context of the global COVID-19 pandemic, which necessitated consideration that the patient might be at risk for infection with the SARS-CoV-2 virus that causes COVID-19. Institutional protocols and algorithms that pertain to the evaluation of patients at risk for COVID-19 are in a state of rapid change based on information released by regulatory bodies including the CDC and federal and state organizations. These policies and algorithms were followed during the patient's care in the ED.  As part of my medical decision making, I reviewed the following data within the [place] notes reviewed and incorporated, Labs reviewed, notes from prior ED visits and [place] Controlled Substance Database   ____________________________________________   FINAL CLINICAL IMPRESSION(S) / ED DIAGNOSES  Final diagnoses:  Closed displaced intertrochanteric fracture of left femur, initial encounter ([place])      NEW MEDICATIONS STARTED DURING THIS VISIT:  New Prescriptions   No medications on file     Note:  This document was prepared using Dragon voice recognition software and may include unintentional  dictation errors.    Merlyn Lot, MD 07/07/18 2006

## 2018-07-07 NOTE — Progress Notes (Signed)
Family Meeting Note  Advance Directive:yes  Today a meeting took place with the Patient.  Patient is able to participate  The following clinical team members were present during this meeting:MD  The following were discussed:Patient's diagnosis: Left hip fracture, Patient's progosis: Unable to determine and Goals for treatment: Full Code  Additional follow-up to be provided: prn  Time spent during discussion:20 minutes  Evette Doffing, MD

## 2018-07-07 NOTE — ED Notes (Signed)
Off unit to xray

## 2018-07-07 NOTE — ED Notes (Signed)
While sleeping pts oxygen saturation dropped into the 80s. RN woke patient and oxygen saturation continued to drop. Pt placed on 3L [place]

## 2018-07-07 NOTE — ED Triage Notes (Signed)
As per patient bent over to pick bottle up and fell over. Denies LOC. Reports pain to left hip, positive shortening and rotation noted.

## 2018-07-07 NOTE — Progress Notes (Signed)
Full consult note and discussion with patient to follow tomorrow.  Called by ED staff. Imaging reviewed.  - Plan for surgery tomorrow, likely late afternoon.  - NPO after midnight - Hold anticoagulation - Admit to Hospitalist team.

## 2018-07-07 NOTE — ED Notes (Signed)
OR MD at bedside to discuss plan of care. Patient to have hip surgery tomorrow. NPO, ABX, 2nd IV line placed.

## 2018-07-07 NOTE — ED Notes (Signed)
ED TO INPATIENT HANDOFF REPORT  ED Nurse Name and Phone #: Mardene Celeste RN   S Name/Age/Gender Nancy Zhang 85 y.o. female Room/Bed: ED03A/ED03A  Code Status   Code Status: Not on file  Home/SNF/Other Home Patient oriented to: self, place, time and situation Is this baseline? Yes   Triage Complete: Triage complete  Chief Complaint fall  Triage Note As per patient bent over to pick bottle up and fell over. Denies LOC. Reports pain to left hip, positive shortening and rotation noted.    Allergies Allergies  Allergen Reactions  . Shellfish Allergy Swelling    Level of Care/Admitting Diagnosis ED Disposition    ED Disposition Condition [place] [facility]: [place] [[id number]]  Level of Care: Med-Surg [16]  Covid Evaluation: N/A  Diagnosis: Closed left hip fracture [facility]) [[id number]]  Admitting Physician: Hyman Bible DODD [[id number]]  Attending Physician: Hyman Bible DODD [[id number]]  Estimated length of stay: past midnight tomorrow  Certification:: I certify this patient will need inpatient services for at least 2 midnights  PT Class (Do Not Modify): Inpatient [101]  PT Acc Code (Do Not Modify): Private [1]       B Medical/Surgery History Past Medical History:  Diagnosis Date  . Breast cancer ([place]) 2010   RT LUMPECTOMY  . Diabetes ([place])   . Diverticulosis   . Hypercholesterolemia   . Hypertension   . Intention tremor   . Personal history of chemotherapy 2010   BREAST CA  . Personal history of radiation therapy 2010   BREAST CA   Past Surgical History:  Procedure Laterality Date  . ABDOMINAL HYSTERECTOMY    . APPENDECTOMY    . BREAST EXCISIONAL BIOPSY Right 2010   positive     A IV Location/Drains/Wounds Patient Lines/Drains/Airways Status   Active Line/Drains/Airways    Name:   Placement date:   Placement time:   Site:   Days:   Peripheral IV 07/07/18 Left Antecubital   07/07/18    1906    Antecubital   less than 1    Peripheral IV 07/07/18 Left Forearm   07/07/18    2025    Forearm   less than 1          Intake/Output Last 24 hours No intake or output data in the 24 hours ending 07/07/18 2139  Labs/Imaging Results for orders placed or performed during the hospital encounter of 07/07/18 (from the past 48 hour(s))  CBC with Differential/Platelet     Status: None   Collection Time: 07/07/18  7:22 PM  Result Value Ref Range   WBC 9.2 4.0 - 10.5 K/uL   RBC 4.33 3.87 - 5.11 MIL/uL   Hemoglobin 12.3 12.0 - 15.0 g/dL   HCT 38.4 36.0 - 46.0 %   MCV 88.7 80.0 - 100.0 fL   MCH 28.4 26.0 - 34.0 pg   MCHC 32.0 30.0 - 36.0 g/dL   RDW 13.2 11.5 - 15.5 %   Platelets 262 150 - 400 K/uL   nRBC 0.0 0.0 - 0.2 %   Neutrophils Relative % 69 %   Neutro Abs 6.3 1.7 - 7.7 K/uL   Lymphocytes Relative 23 %   Lymphs Abs 2.1 0.7 - 4.0 K/uL   Monocytes Relative 7 %   Monocytes Absolute 0.7 0.1 - 1.0 K/uL   Eosinophils Relative 1 %   Eosinophils Absolute 0.1 0.0 - 0.5 K/uL   Basophils Relative 0 %   Basophils Absolute 0.0 0.0 -  0.1 K/uL   Immature Granulocytes 0 %   Abs Immature Granulocytes 0.03 0.00 - 0.07 K/uL    Comment: Performed at [facility], [address] [place], [place] [address]  Comprehensive metabolic panel     Status: Abnormal   Collection Time: 07/07/18  7:22 PM  Result Value Ref Range   Sodium 140 135 - 145 mmol/L   Potassium 3.8 3.5 - 5.1 mmol/L   Chloride 101 98 - 111 mmol/L   CO2 26 22 - 32 mmol/L   Glucose, Bld 231 (H) 70 - 99 mg/dL   BUN 27 (H) 8 - 23 mg/dL   Creatinine, Ser 1.08 (H) 0.44 - 1.00 mg/dL   Calcium 9.5 8.9 - 10.3 mg/dL   Total Protein 7.4 6.5 - 8.1 g/dL   Albumin 4.0 3.5 - 5.0 g/dL   AST 17 15 - 41 U/L   ALT 12 0 - 44 U/L   Alkaline Phosphatase 75 38 - 126 U/L   Total Bilirubin 0.4 0.3 - 1.2 mg/dL   GFR calc non Af Amer 45 (L) >60 mL/min   GFR calc Af Amer 52 (L) >60 mL/min   Anion gap 13 5 - 15    Comment: Performed at [facility], [address] [place], [place] [address]  Protime-INR     Status: None   Collection Time: 07/07/18  8:16 PM  Result Value Ref Range   Prothrombin Time 12.6 11.4 - 15.2 seconds   INR 1.0 0.8 - 1.2    Comment: (NOTE) INR goal varies based on device and disease states. Performed at [facility], [address] [place], [place] [address]   Type and screen     Status: None (Preliminary result)   Collection Time: 07/07/18  8:46 PM  Result Value Ref Range   ABO/RH(D) PENDING    Antibody Screen PENDING    Sample Expiration      07/10/2018 Performed at [facility], [address], [place], [place] [address]    Dg Chest 1 View  Result Date: 07/07/2018 CLINICAL DATA:  LEFT hip fracture.  Preoperative assessment. EXAM: CHEST  1 VIEW COMPARISON:  None. FINDINGS: The heart size and mediastinal contours are within normal limits. Both lungs are clear. The visualized skeletal structures are unremarkable. IMPRESSION: No active disease. Electronically Signed   By: Staci Righter M.D.   On: 07/07/2018 19:55   Dg Hip Unilat W Or Wo Pelvis 2-3 Views Left  Result Date: 07/07/2018 CLINICAL DATA:  [place]. Pain. EXAM: DG HIP (WITH OR WITHOUT PELVIS) 2-3V LEFT COMPARISON:  None. FINDINGS: Intertrochanteric fracture LEFT hip, avulsion lesser trochanter. Roughly 1 cm distraction of the fracture fragments. Soft tissue swelling. Vascular calcification. No pelvic fracture. IMPRESSION: Intertrochanteric fracture LEFT hip. Electronically Signed   By: Staci Righter M.D.   On: 07/07/2018 19:53    Pending Labs [employer] (From admission, onward)    Start     Ordered   Signed and Occupational hygienist morning,   R     Signed and Held   Signed and Held  CBC  Tomorrow morning,   R     Signed and Held          Vitals/Pain Today's Vitals   07/07/18 2123 07/07/18 2124 07/07/18 2125 07/07/18 2126  BP:      Pulse: 70 70 80 69  Resp: (!) 21 19 (!) 22 18  Temp:      TempSrc:  SpO2: (!) 88% (!) 83% (!) 87% 100%  Weight:      Height:      PainSc:        Isolation Precautions No active isolations  Medications Medications  ceFAZolin (ANCEF) IVPB 1 g/50 mL premix (has no administration in time range)  fentaNYL (SUBLIMAZE) injection 50 mcg (50 mcg Intravenous Given 07/07/18 2115)  fentaNYL (SUBLIMAZE) 100 MCG/2ML injection (has no administration in time range)  fentaNYL (SUBLIMAZE) injection 50 mcg (50 mcg Intravenous Given 07/07/18 1947)  ondansetron (ZOFRAN) injection 4 mg (4 mg Intravenous Given 07/07/18 1947)  ondansetron (ZOFRAN) injection 4 mg (4 mg Intravenous Given 07/07/18 2114)    Mobility walks with device Moderate fall risk   Focused Assessments ortho    R Recommendations: See Admitting Provider Note  Report given to:   Additional Notes:

## 2018-07-07 NOTE — H&P (Signed)
[place] at [place] NAME: Nancy Zhang    MR#:  [id number]  DATE OF BIRTH:  12/19/1925  DATE OF ADMISSION:  07/07/2018  PRIMARY CARE PHYSICIAN: [facility] [place]   REQUESTING/REFERRING PHYSICIAN: Merlyn Lot, MD  CHIEF COMPLAINT:   Chief Complaint  Patient presents with  . Hip Pain  . Fall    HISTORY OF PRESENT ILLNESS:  Nancy Zhang  is a 85 y.o. female with a known history of type 2 diabetes, hypertension, hyperlipidemia, history of breast cancer who presented to the ED with left hip pain after a mechanical fall at home this afternoon.  She states she was putting bottled water in her fridge when she suddenly fell and landed on the left side of her bottom.  She states she had immediate pain of her left lateral hip.  She denies any numbness or tingling.  She denies any head trauma or loss of consciousness.  In the ED, she was hypertensive.  Labs were unremarkable.  Chest x-ray was negative.  Left hip x-ray showed a left intertrochanteric fracture.  Hospitalists were called for admission.  PAST MEDICAL HISTORY:   Past Medical History:  Diagnosis Date  . Breast cancer ([place]) 2010   RT LUMPECTOMY  . Diabetes ([place])   . Diverticulosis   . Hypercholesterolemia   . Hypertension   . Intention tremor   . Personal history of chemotherapy 2010   BREAST CA  . Personal history of radiation therapy 2010   BREAST CA    PAST SURGICAL HISTORY:   Past Surgical History:  Procedure Laterality Date  . ABDOMINAL HYSTERECTOMY    . APPENDECTOMY    . BREAST EXCISIONAL BIOPSY Right 2010   positive    SOCIAL HISTORY:   Social History   Tobacco Use  . Smoking status: Never Smoker  . Smokeless tobacco: Never Used  Substance Use Topics  . Alcohol use: No    FAMILY HISTORY:   Family History  Problem Relation Age of Onset  . Breast cancer Daughter 66    DRUG ALLERGIES:   Allergies  Allergen Reactions  .  Shellfish Allergy Swelling    REVIEW OF SYSTEMS:   Review of Systems  Constitutional: Negative for chills and fever.  HENT: Negative for congestion and sore throat.   Eyes: Negative for blurred vision and double vision.  Respiratory: Negative for cough and shortness of breath.   Cardiovascular: Negative for chest pain and palpitations.  Gastrointestinal: Negative for nausea and vomiting.  Genitourinary: Negative for dysuria and urgency.  Musculoskeletal: Positive for falls and joint pain. Negative for back pain and neck pain.  Neurological: Negative for dizziness and headaches.  Psychiatric/Behavioral: Negative for depression. The patient is not nervous/anxious.     MEDICATIONS AT HOME:   Prior to Admission medications   Medication Sig Start Date End Date Taking? Authorizing Provider  amLODipine (NORVASC) 5 MG tablet Take 5 mg by mouth daily.     [provider]  atorvastatin (LIPITOR) 10 MG tablet Take 10 mg by mouth daily at 6 PM.     [provider]  hydrochlorothiazide (HYDRODIURIL) 25 MG tablet Take 25 mg by mouth daily.     [provider]  metFORMIN (GLUCOPHAGE-XR) 500 MG 24 hr tablet Take 500 mg by mouth 2 (two) times daily.  06/22/13   [provider]  quinapril (ACCUPRIL) 40 MG tablet Take 40 mg by mouth daily.    [provider]  VITAL SIGNS:  Blood pressure (!) 172/81, pulse 81, temperature 98.4 F (36.9 C), temperature source Oral, resp. rate (!) 24, height 5' (1.524 m), weight 72.6 kg, SpO2 98 %.  PHYSICAL EXAMINATION:  Physical Exam  GENERAL:  85 y.o.-year-old patient lying in the bed with no acute distress.  EYES: Pupils equal, round, reactive to light and accommodation. No scleral icterus. Extraocular muscles intact.  HEENT: Head atraumatic, normocephalic. Oropharynx and nasopharynx clear.  NECK:  Supple, no jugular venous distention. No thyroid enlargement, no tenderness.  LUNGS: Normal breath sounds  bilaterally, no wheezing, rales,rhonchi or crepitation. No use of accessory muscles of respiration.  CARDIOVASCULAR: S1, S2 normal. No murmurs, rubs, or gallops.  ABDOMEN: Soft, nontender, nondistended. Bowel sounds present. No organomegaly or mass.  EXTREMITIES: No pedal edema, cyanosis, or clubbing. + Left lower extremity is shortened and externally rotated. NEUROLOGIC: Cranial nerves II through XII are intact.  Unable to assess muscle strength in the left lower extremity secondary to pain sensation intact. Gait not checked.  PSYCHIATRIC: The patient is alert and oriented x 3.  SKIN: No obvious rash, lesion, or ulcer.   LABORATORY PANEL:   CBC Recent Labs  Lab 07/07/18 1922  WBC 9.2  HGB 12.3  HCT 38.4  PLT 262   ------------------------------------------------------------------------------------------------------------------  Chemistries  Recent Labs  Lab 07/07/18 1922  NA 140  K 3.8  CL 101  CO2 26  GLUCOSE 231*  BUN 27*  CREATININE 1.08*  CALCIUM 9.5  AST 17  ALT 12  ALKPHOS 75  BILITOT 0.4   ------------------------------------------------------------------------------------------------------------------  Cardiac Enzymes No results for input(s): TROPONINI in the last 168 hours. ------------------------------------------------------------------------------------------------------------------  RADIOLOGY:  Dg Chest 1 View  Result Date: 07/07/2018 CLINICAL DATA:  LEFT hip fracture.  Preoperative assessment. EXAM: CHEST  1 VIEW COMPARISON:  None. FINDINGS: The heart size and mediastinal contours are within normal limits. Both lungs are clear. The visualized skeletal structures are unremarkable. IMPRESSION: No active disease. Electronically Signed   By: Staci Righter M.D.   On: 07/07/2018 19:55   Dg Hip Unilat W Or Wo Pelvis 2-3 Views Left  Result Date: 07/07/2018 CLINICAL DATA:  [place]. Pain. EXAM: DG HIP (WITH OR WITHOUT PELVIS) 2-3V LEFT COMPARISON:  None.  FINDINGS: Intertrochanteric fracture LEFT hip, avulsion lesser trochanter. Roughly 1 cm distraction of the fracture fragments. Soft tissue swelling. Vascular calcification. No pelvic fracture. IMPRESSION: Intertrochanteric fracture LEFT hip. Electronically Signed   By: Staci Righter M.D.   On: 07/07/2018 19:53      IMPRESSION AND PLAN:   Left hip fracture- s/p mechanical fall -Ortho consult-plan for surgical repair tomorrow -Will make patient n.p.o. at midnight -Hold on anticoagulation -Pain control  Hypertension-blood pressure is elevated in the ED, likely secondary to pain -Continue home Norvasc, HCTZ, quinapril  Type 2 diabetes-blood sugars elevated in the ED -SSI  Hyperlipidemia-stable -Continue home Lipitor  DVT prophylaxis- SCDs.  Plan to start pharmacologic anticoagulation after surgery.  All the records are reviewed and case discussed with ED provider. Management plans discussed with the patient, family and they are in agreement.  CODE STATUS: Full  TOTAL TIME TAKING CARE OF THIS PATIENT: 45 minutes.    Berna Spare Mayo M.D on 07/07/2018 at 8:13 PM  Between 7am to 6pm - Pager - [phone]  After 6pm go to [email or web address] - Technical brewer [facility]  Office  [phone]  CC: Primary care physician; [employer]   Note: This dictation was prepared with Sales executive  along with smaller phrase technology. Any transcriptional errors that result from this process are unintentional.

## 2018-07-08 ENCOUNTER — Encounter: Payer: Self-pay | Admitting: Anesthesiology

## 2018-07-08 ENCOUNTER — Encounter
Admission: EM | Disposition: A | Discharge status: Skilled Nursing Facility | Payer: Self-pay | Source: Home / Self Care | Attending: Internal Medicine

## 2018-07-08 ENCOUNTER — Inpatient Hospital Stay: Payer: Medicare Other | Admitting: Anesthesiology

## 2018-07-08 ENCOUNTER — Inpatient Hospital Stay: Payer: Medicare Other

## 2018-07-08 HISTORY — PX: INTRAMEDULLARY (IM) NAIL INTERTROCHANTERIC: SHX5875

## 2018-07-08 LAB — CBC
HCT: 35.1 % — ABNORMAL LOW (ref 36.0–46.0)
Hemoglobin: 11.3 g/dL — ABNORMAL LOW (ref 12.0–15.0)
MCH: 28 pg (ref 26.0–34.0)
MCHC: 32.2 g/dL (ref 30.0–36.0)
MCV: 87.1 fL (ref 80.0–100.0)
Platelets: 256 10*3/uL (ref 150–400)
RBC: 4.03 MIL/uL (ref 3.87–5.11)
RDW: 13.2 % (ref 11.5–15.5)
WBC: 15.9 10*3/uL — ABNORMAL HIGH (ref 4.0–10.5)
nRBC: 0 % (ref 0.0–0.2)

## 2018-07-08 LAB — URINALYSIS, COMPLETE (UACMP) WITH MICROSCOPIC
Bilirubin Urine: NEGATIVE
Glucose, UA: NEGATIVE mg/dL
Ketones, ur: NEGATIVE mg/dL
Nitrite: NEGATIVE
Protein, ur: 30 mg/dL — AB
Specific Gravity, Urine: 1.017 (ref 1.005–1.030)
Squamous Epithelial / HPF: NONE SEEN (ref 0–5)
pH: 5 (ref 5.0–8.0)

## 2018-07-08 LAB — BASIC METABOLIC PANEL
Anion gap: 11 (ref 5–15)
BUN: 30 mg/dL — ABNORMAL HIGH (ref 8–23)
CO2: 26 mmol/L (ref 22–32)
Calcium: 9.1 mg/dL (ref 8.9–10.3)
Chloride: 105 mmol/L (ref 98–111)
Creatinine, Ser: 1.29 mg/dL — ABNORMAL HIGH (ref 0.44–1.00)
GFR calc Af Amer: 42 mL/min — ABNORMAL LOW (ref >60)
GFR calc non Af Amer: 36 mL/min — ABNORMAL LOW (ref >60)
Glucose, Bld: 244 mg/dL — ABNORMAL HIGH (ref 70–99)
Potassium: 4.5 mmol/L (ref 3.5–5.1)
Sodium: 142 mmol/L (ref 135–145)

## 2018-07-08 LAB — GLUCOSE, CAPILLARY
Glucose-Capillary: 178 mg/dL — ABNORMAL HIGH (ref 70–99)
Glucose-Capillary: 123 mg/dL — ABNORMAL HIGH (ref 70–99)
Glucose-Capillary: 152 mg/dL — ABNORMAL HIGH (ref 70–99)
Glucose-Capillary: 139 mg/dL — ABNORMAL HIGH (ref 70–99)

## 2018-07-08 LAB — SURGICAL PCR SCREEN
MRSA, PCR: NEGATIVE
Staphylococcus aureus: POSITIVE — AB

## 2018-07-08 SURGERY — FIXATION, FRACTURE, INTERTROCHANTERIC, WITH INTRAMEDULLARY ROD
Anesthesia: Spinal | Site: Hip | Laterality: Left

## 2018-07-08 MED ORDER — FLEET ENEMA 7-19 GM/118ML RE ENEM: 1.0000 | ENEMA | Freq: Once | RECTAL | Status: DC | PRN

## 2018-07-08 MED ORDER — ONDANSETRON HCL 4 MG PO TABS: 4.0000 mg | ORAL_TABLET | Freq: Four times a day (QID) | ORAL | Status: DC | PRN

## 2018-07-08 MED ORDER — SODIUM CHLORIDE 0.9 % IV SOLN: INTRAVENOUS | Status: DC | PRN

## 2018-07-08 MED ORDER — ENOXAPARIN SODIUM 30 MG/0.3ML ~~LOC~~ SOLN
30.0000 mg | SUBCUTANEOUS | Status: DC
  Administered 2018-07-09 – 2018-07-11 (×3): 30 mg via SUBCUTANEOUS
  Filled 2018-07-08 (×3): qty 0.3

## 2018-07-08 MED ORDER — OXYCODONE HCL 5 MG PO TABS: 5.0000 mg | ORAL_TABLET | ORAL | Status: DC | PRN

## 2018-07-08 MED ORDER — METOCLOPRAMIDE HCL 5 MG PO TABS
5.0000 mg | ORAL_TABLET | Freq: Three times a day (TID) | ORAL | Status: DC | PRN
  Administered 2018-07-09: 5 mg via ORAL
  Filled 2018-07-08: qty 1

## 2018-07-08 MED ORDER — ONDANSETRON HCL 4 MG/2ML IJ SOLN
4.0000 mg | Freq: Once | INTRAMUSCULAR | Status: AC
  Administered 2018-07-08: 14:00:00 4 mg via INTRAVENOUS

## 2018-07-08 MED ORDER — BUPIVACAINE LIPOSOME 1.3 % IJ SUSP
INTRAMUSCULAR | Status: DC | PRN
  Administered 2018-07-08: 50 mL

## 2018-07-08 MED ORDER — PROPOFOL 500 MG/50ML IV EMUL: INTRAVENOUS | Status: DC | PRN

## 2018-07-08 MED ORDER — DOCUSATE SODIUM 100 MG PO CAPS
100.0000 mg | ORAL_CAPSULE | Freq: Two times a day (BID) | ORAL | Status: DC
  Administered 2018-07-08 – 2018-07-11 (×6): 100 mg via ORAL
  Filled 2018-07-08 (×6): qty 1

## 2018-07-08 MED ORDER — PROPOFOL 500 MG/50ML IV EMUL
INTRAVENOUS | Status: DC | PRN
  Administered 2018-07-08: 25 ug/kg/min via INTRAVENOUS

## 2018-07-08 MED ORDER — ALUM & MAG HYDROXIDE-SIMETH 200-200-20 MG/5ML PO SUSP
30.0000 mL | Freq: Four times a day (QID) | ORAL | Status: DC | PRN
  Administered 2018-07-09: 30 mL via ORAL
  Filled 2018-07-08: qty 30

## 2018-07-08 MED ORDER — KETAMINE HCL 50 MG/ML IJ SOLN
INTRAMUSCULAR | Status: DC | PRN
  Administered 2018-07-08: 20 mg via INTRAMUSCULAR

## 2018-07-08 MED ORDER — ACETAMINOPHEN 500 MG PO TABS
1000.0000 mg | ORAL_TABLET | Freq: Three times a day (TID) | ORAL | Status: DC
  Administered 2018-07-08 – 2018-07-09 (×3): 1000 mg via ORAL
  Filled 2018-07-08 (×4): qty 2

## 2018-07-08 MED ORDER — SODIUM CHLORIDE 0.9 % IV SOLN: INTRAVENOUS | Status: DC

## 2018-07-08 MED ORDER — SENNOSIDES-DOCUSATE SODIUM 8.6-50 MG PO TABS
1.0000 | ORAL_TABLET | Freq: Every evening | ORAL | Status: DC | PRN
  Administered 2018-07-10: 1 via ORAL
  Filled 2018-07-08: qty 1

## 2018-07-08 MED ORDER — PHENYLEPHRINE HCL (PRESSORS) 10 MG/ML IV SOLN
INTRAVENOUS | Status: DC | PRN
  Administered 2018-07-08: 150 ug via INTRAVENOUS
  Administered 2018-07-08: 100 ug via INTRAVENOUS

## 2018-07-08 MED ORDER — ENOXAPARIN SODIUM 40 MG/0.4ML ~~LOC~~ SOLN: 40.0000 mg | SUBCUTANEOUS | Status: DC

## 2018-07-08 MED ORDER — FENTANYL CITRATE (PF) 100 MCG/2ML IJ SOLN: 25.0000 ug | INTRAMUSCULAR | Status: DC | PRN

## 2018-07-08 MED ORDER — PROPOFOL 10 MG/ML IV BOLUS
INTRAVENOUS | Status: DC | PRN
  Administered 2018-07-08: 30 mg via INTRAVENOUS
  Administered 2018-07-08: 20 mg via INTRAVENOUS

## 2018-07-08 MED ORDER — OXYCODONE HCL 5 MG PO TABS: 2.5000 mg | ORAL_TABLET | ORAL | Status: DC | PRN

## 2018-07-08 MED ORDER — SODIUM CHLORIDE 0.9 % IV SOLN
INTRAVENOUS | Status: DC
  Administered 2018-07-08 – 2018-07-09 (×2): via INTRAVENOUS

## 2018-07-08 MED ORDER — METFORMIN HCL 500 MG PO TABS
500.0000 mg | ORAL_TABLET | Freq: Two times a day (BID) | ORAL | Status: DC
  Administered 2018-07-09 – 2018-07-10 (×3): 500 mg via ORAL
  Filled 2018-07-08 (×4): qty 1

## 2018-07-08 MED ORDER — METOCLOPRAMIDE HCL 5 MG/ML IJ SOLN: 5.0000 mg | Freq: Three times a day (TID) | INTRAMUSCULAR | Status: DC | PRN

## 2018-07-08 MED ORDER — GLYCOPYRROLATE 0.2 MG/ML IJ SOLN
INTRAMUSCULAR | Status: DC | PRN
  Administered 2018-07-08: 0.1 mg via INTRAVENOUS

## 2018-07-08 MED ORDER — TRAMADOL HCL 50 MG PO TABS
50.0000 mg | ORAL_TABLET | Freq: Four times a day (QID) | ORAL | Status: DC | PRN
  Administered 2018-07-09 – 2018-07-11 (×3): 50 mg via ORAL
  Filled 2018-07-08 (×3): qty 1

## 2018-07-08 MED ORDER — HYDROMORPHONE HCL 1 MG/ML IJ SOLN: 0.2500 mg | INTRAMUSCULAR | Status: DC | PRN

## 2018-07-08 MED ORDER — METHOCARBAMOL 500 MG PO TABS
500.0000 mg | ORAL_TABLET | Freq: Four times a day (QID) | ORAL | Status: DC | PRN
  Filled 2018-07-08: qty 1

## 2018-07-08 MED ORDER — METHOCARBAMOL 1000 MG/10ML IJ SOLN
500.0000 mg | Freq: Four times a day (QID) | INTRAVENOUS | Status: DC | PRN
  Filled 2018-07-08: qty 5

## 2018-07-08 MED ORDER — BISACODYL 10 MG RE SUPP
10.0000 mg | Freq: Every day | RECTAL | Status: DC | PRN
  Administered 2018-07-10: 10 mg via RECTAL
  Filled 2018-07-08: qty 1

## 2018-07-08 MED ORDER — PROPOFOL 10 MG/ML IV BOLUS: INTRAVENOUS | Status: DC | PRN

## 2018-07-08 MED ORDER — SODIUM CHLORIDE 0.9 % IV SOLN
INTRAVENOUS | Status: DC | PRN
  Administered 2018-07-08: 25 ug/min via INTRAVENOUS

## 2018-07-08 MED ORDER — CEFAZOLIN SODIUM-DEXTROSE 2-4 GM/100ML-% IV SOLN
2.0000 g | Freq: Four times a day (QID) | INTRAVENOUS | Status: DC
  Administered 2018-07-08 – 2018-07-09 (×2): 2 g via INTRAVENOUS
  Filled 2018-07-08 (×3): qty 100

## 2018-07-08 MED ORDER — ONDANSETRON HCL 4 MG/2ML IJ SOLN
4.0000 mg | Freq: Four times a day (QID) | INTRAMUSCULAR | Status: DC | PRN
  Administered 2018-07-09: 07:00:00 4 mg via INTRAVENOUS
  Filled 2018-07-08: qty 2

## 2018-07-08 MED ORDER — SODIUM CHLORIDE 0.9 % IV SOLN
INTRAVENOUS | Status: DC | PRN
  Administered 2018-07-08: 13:00:00

## 2018-07-08 MED ORDER — ENOXAPARIN SODIUM 40 MG/0.4ML ~~LOC~~ SOLN
40.0000 mg | SUBCUTANEOUS | Status: DC
  Filled 2018-07-08: qty 0.4

## 2018-07-08 MED ORDER — ACETAMINOPHEN 10 MG/ML IV SOLN
INTRAVENOUS | Status: DC | PRN
  Administered 2018-07-08: 1000 mg via INTRAVENOUS

## 2018-07-08 MED ORDER — SODIUM CHLORIDE 0.9 % IV SOLN
INTRAVENOUS | Status: DC | PRN
  Administered 2018-07-08: 12:00:00 via INTRAVENOUS

## 2018-07-08 MED ORDER — ONDANSETRON HCL 4 MG/2ML IJ SOLN
INTRAMUSCULAR | Status: AC
  Filled 2018-07-08: qty 2

## 2018-07-08 MED ORDER — BUPIVACAINE HCL (PF) 0.5 % IJ SOLN
INTRAMUSCULAR | Status: DC | PRN
  Administered 2018-07-08: 3 mL

## 2018-07-08 SURGICAL SUPPLY — 52 items
"PENCIL ELECTRO HAND CTR " (MISCELLANEOUS) ×1 IMPLANT
APL PRP STRL LF DISP 70% ISPRP (MISCELLANEOUS) ×1
BIT DRILL 4.0X280 (BIT) ×2 IMPLANT
BLADE SURG 15 STRL LF DISP TIS (BLADE) ×1 IMPLANT
BLADE SURG 15 STRL SS (BLADE) ×3
CANISTER SUCT 1200ML W/VALVE (MISCELLANEOUS) ×3 IMPLANT
CHLORAPREP W/TINT 26 (MISCELLANEOUS) ×3 IMPLANT
CNTNR SPEC 2.5X3XGRAD LEK (MISCELLANEOUS) ×1
CONT SPEC 4OZ STER OR WHT (MISCELLANEOUS) ×2
CONT SPEC 4OZ STRL OR WHT (MISCELLANEOUS) ×1
CONTAINER SPEC 2.5X3XGRAD LEK (MISCELLANEOUS) IMPLANT
COVER WAND RF STERILE (DRAPES) ×3 IMPLANT
DRAPE SHEET LG 3/4 BI-LAMINATE (DRAPES) ×3 IMPLANT
DRAPE SURG 17X11 SM STRL (DRAPES) ×6 IMPLANT
DRAPE U-SHAPE 47X51 STRL (DRAPES) ×6 IMPLANT
DRSG OPSITE POSTOP 3X4 (GAUZE/BANDAGES/DRESSINGS) ×9 IMPLANT
ELECT REM PT RETURN 9FT ADLT (ELECTROSURGICAL) ×3
ELECTRODE REM PT RTRN 9FT ADLT (ELECTROSURGICAL) ×1 IMPLANT
GLOVE BIOGEL PI IND STRL 8 (GLOVE) ×1 IMPLANT
GLOVE BIOGEL PI INDICATOR 8 (GLOVE) ×2
GLOVE SURG SYN 7.5  E (GLOVE) ×8
GLOVE SURG SYN 7.5 E (GLOVE) ×4 IMPLANT
GLOVE SURG SYN 7.5 PF PI (GLOVE) ×1 IMPLANT
GOWN STRL REUS W/ TWL LRG LVL3 (GOWN DISPOSABLE) ×1 IMPLANT
GOWN STRL REUS W/ TWL XL LVL3 (GOWN DISPOSABLE) ×1 IMPLANT
GOWN STRL REUS W/TWL LRG LVL3 (GOWN DISPOSABLE) ×12
GOWN STRL REUS W/TWL XL LVL3 (GOWN DISPOSABLE) ×3
GUIDE PIN 3.2X330 (PIN) ×3
GUIDEWIRE BALL NOSE 3.0X900 (WIRE) ×2 IMPLANT
HOLDER FOLEY CATH W/STRAP (MISCELLANEOUS) ×2 IMPLANT
KIT PATIENT CARE HANA TABLE (KITS) ×3 IMPLANT
KIT TURNOVER KIT A (KITS) ×3 IMPLANT
MAT ABSORB  FLUID 56X50 GRAY (MISCELLANEOUS) ×4
MAT ABSORB FLUID 56X50 GRAY (MISCELLANEOUS) ×2 IMPLANT
NAIL IT ES 125DX10X36 LT (Nail) ×2 IMPLANT
NDL FILTER BLUNT 18X1 1/2 (NEEDLE) ×1 IMPLANT
NEEDLE FILTER BLUNT 18X 1/2SAF (NEEDLE) ×2
NEEDLE FILTER BLUNT 18X1 1/2 (NEEDLE) ×1 IMPLANT
NEEDLE HYPO 22GX1.5 SAFETY (NEEDLE) ×3 IMPLANT
NS IRRIG 1000ML POUR BTL (IV SOLUTION) ×3 IMPLANT
PACK HIP COMPR (MISCELLANEOUS) ×3 IMPLANT
PENCIL ELECTRO HAND CTR (MISCELLANEOUS) ×3 IMPLANT
PIN GUIDE 3.2X330 (PIN) IMPLANT
SCREW LOCK CORT 5X34 (Screw) ×2 IMPLANT
SCREW LOCK LAG 10.5X95 GALILEO (Screw) ×2 IMPLANT
STAPLER SKIN PROX 35W (STAPLE) ×3 IMPLANT
SUT VIC AB 2-0 CT2 27 (SUTURE) ×3 IMPLANT
SYR 10ML LL (SYRINGE) ×3 IMPLANT
SYR 20CC LL (SYRINGE) ×2 IMPLANT
SYR 30ML LL (SYRINGE) ×3 IMPLANT
TAPE CLOTH 3X10 WHT NS LF (GAUZE/BANDAGES/DRESSINGS) ×6 IMPLANT
TRAY FOLEY SLVR 16FR LF STAT (SET/KITS/TRAYS/PACK) ×2 IMPLANT

## 2018-07-08 NOTE — Progress Notes (Signed)
Patient returned from surgery. VSS at this time. Started IV fluids. Patient states she is comfortable.

## 2018-07-08 NOTE — Progress Notes (Signed)
[facility] [place] NAME: Nancy Zhang    MR#:  [id number]  DATE OF BIRTH:  12-22-1925  SUBJECTIVE:  CHIEF COMPLAINT:   Chief Complaint  Patient presents with  . Hip Pain  . Fall   - patient seen post op after left hip surgery- still groggy, complains of left hip pain  REVIEW OF SYSTEMS:  Review of Systems  Constitutional: Positive for malaise/fatigue. Negative for chills and fever.  HENT: Negative for congestion, ear discharge, hearing loss and nosebleeds.   Eyes: Negative for blurred vision and double vision.  Respiratory: Negative for cough, shortness of breath and wheezing.   Cardiovascular: Negative for chest pain and palpitations.  Gastrointestinal: Negative for abdominal pain, constipation, diarrhea, nausea and vomiting.  Genitourinary: Negative for dysuria.  Musculoskeletal: Positive for joint pain.  Neurological: Negative for dizziness, seizures and headaches.    DRUG ALLERGIES:   Allergies  Allergen Reactions  . Shellfish Allergy Swelling    VITALS:  Blood pressure (!) 161/68, pulse 87, temperature 98.7 F (37.1 C), temperature source Tympanic, resp. rate 18, height 5' (1.524 m), weight 72.6 kg, SpO2 99 %.  PHYSICAL EXAMINATION:  Physical Exam   GENERAL:  85 y.o.-year-old elderly patient lying in the bed with no acute distress.  EYES: Pupils equal, round, reactive to light and accommodation. No scleral icterus. Extraocular muscles intact.  HEENT: Head atraumatic, normocephalic. Oropharynx and nasopharynx clear.  NECK:  Supple, no jugular venous distention. No thyroid enlargement, no tenderness.  LUNGS: Normal breath sounds bilaterally, no wheezing, rales,rhonchi or crepitation. No use of accessory muscles of respiration. Decreased bibasilar breath sounds CARDIOVASCULAR: S1, S2 normal. No  rubs, or gallops. 2/6 systolic murmur present ABDOMEN: Soft, nontender, nondistended. Bowel sounds present. No organomegaly  or mass.  EXTREMITIES: No pedal edema, cyanosis, or clubbing. Left hip immobilized NEUROLOGIC: Cranial nerves II through XII are intact. Muscle strength 5/5 in all extremities. Sensation intact. Gait not checked. Global weakness noted PSYCHIATRIC: The patient is alert and oriented x 1-2.  SKIN: No obvious rash, lesion, or ulcer.    LABORATORY PANEL:   CBC Recent Labs  Lab 07/08/18 0420  WBC 15.9*  HGB 11.3*  HCT 35.1*  PLT 256   ------------------------------------------------------------------------------------------------------------------  Chemistries  Recent Labs  Lab 07/07/18 1922 07/08/18 0420  NA 140 142  K 3.8 4.5  CL 101 105  CO2 26 26  GLUCOSE 231* 244*  BUN 27* 30*  CREATININE 1.08* 1.29*  CALCIUM 9.5 9.1  AST 17  --   ALT 12  --   ALKPHOS 75  --   BILITOT 0.4  --    ------------------------------------------------------------------------------------------------------------------  Cardiac Enzymes No results for input(s): TROPONINI in the last 168 hours. ------------------------------------------------------------------------------------------------------------------  RADIOLOGY:  Dg Chest 1 View  Result Date: 07/07/2018 CLINICAL DATA:  LEFT hip fracture.  Preoperative assessment. EXAM: CHEST  1 VIEW COMPARISON:  None. FINDINGS: The heart size and mediastinal contours are within normal limits. Both lungs are clear. The visualized skeletal structures are unremarkable. IMPRESSION: No active disease. Electronically Signed   By: Staci Righter M.D.   On: 07/07/2018 19:55   Dg Hip Unilat W Or Wo Pelvis 2-3 Views Left  Result Date: 07/07/2018 CLINICAL DATA:  [place]. Pain. EXAM: DG HIP (WITH OR WITHOUT PELVIS) 2-3V LEFT COMPARISON:  None. FINDINGS: Intertrochanteric fracture LEFT hip, avulsion lesser trochanter. Roughly 1 cm distraction of the fracture fragments. Soft tissue swelling. Vascular calcification. No pelvic fracture. IMPRESSION: Intertrochanteric fracture  LEFT  hip. Electronically Signed   By: Staci Righter M.D.   On: 07/07/2018 19:53    EKG:   Orders placed or performed during the hospital encounter of 07/07/18  . ED EKG  . ED EKG  . ED EKG  . ED EKG    ASSESSMENT AND PLAN:   85 year old female with past medical history significant for hypertension, hyperlipidemia, history of breast cancer presents from home secondary to fall and left hip intertrochanteric fracture.  1.  Left hip intertrochanteric fracture-following a mechanical fall. -Appreciate Ortho consult.  For ORIF today -Continue postop pain control, physical therapy and evaluate for rehab placement -DVT prophylaxis per Ortho  2.  Hypertension-continue home medications.  Patient on Norvasc, quinapril-hold hydrochlorothiazide as appears dehydrated  3.  Diabetes mellitus-sliding scale insulin.  Restart metformin from tomorrow  4.  DVT prophylaxis-will be started after surgery today.   All the records are reviewed and case discussed with Care Management/Social Workerr. Management plans discussed with the patient, family and they are in agreement.  CODE STATUS: Full Code  TOTAL TIME TAKING CARE OF THIS PATIENT: 38 minutes.   POSSIBLE D/C IN 2 DAYS, DEPENDING ON CLINICAL CONDITION.   Gladstone Lighter M.D on 07/08/2018 at 1:45 PM  Between 7am to 6pm - Pager - [phone]  After 6pm go to [email or web address] - password [email or web address] [facility]  Office  [phone]  CC: Primary care physician; [employer]

## 2018-07-08 NOTE — Progress Notes (Signed)
Inpatient Diabetes Program Recommendations  AACE/ADA: New Consensus Statement on Inpatient Glycemic Control (2015)  Target Ranges:  Prepandial:   less than 140 mg/dL      Peak postprandial:   less than 180 mg/dL (1-2 hours)      Critically ill patients:  140 - 180 mg/dL   Lab Results  Component Value Date   GLUCAP 123 (H) 07/08/2018    Review of Glycemic Control Results for Nancy Zhang, Nancy Zhang (MRN [id number]) as of 07/08/2018 14:25  Ref. Range 07/08/2018 07:46 07/08/2018 13:42  Glucose-Capillary Latest Ref Range: 70 - 99 mg/dL 178 (H) 123 (H)   Diabetes history: DM 2 Outpatient Diabetes medications:  Metformin XR 500 mg bid Current orders for Inpatient glycemic control:  Novolog sensitive tid with meals and HS Metformin 500 mg bid Inpatient Diabetes Program Recommendations:     Agree with current orders.  No recommendations.   Thanks,  Adah Perl, RN, BC-ADM Inpatient Diabetes Coordinator Pager [phone] (8a-5p)

## 2018-07-08 NOTE — Op Note (Signed)
DATE OF SURGERY: 07/08/2018  PREOPERATIVE DIAGNOSIS: Left intertrochanteric hip fracture  POSTOPERATIVE DIAGNOSIS: Left intertrochanteric hip fracture  PROCEDURE: Intramedullary nailing of L femur with cephalomedullary device  SURGEON: Cato Mulligan, MD  ANESTHESIA: spinal  EBL: 100 cc  IVF: per anesthesia record  COMPONENTS:  AOS Galileo ES Long Nail: 10x332mm; 39mm lag screw; 30mm distal cortical interlocking screw  INDICATIONS: Nancy Zhang is a 85 y.o. female who sustained an intertrochanteric fracture after a fall. Risks and benefits of intramedullary nailing were explained to the patient. Risks include but are not limited to bleeding, infection, injury to tissues, nerves, vessels, nonunion/malunion, hardware failure, limb length discrepancy/hip rotation mismatch and risks of anesthesia. The patient and family understand these risks, have completed an informed consent, and wish to proceed.   PROCEDURE:  The patient was brought into the operating room. After administering anesthesia, the patient was placed in the supine position on the Hana table. The uninjured leg was placed in an extended position while the injured lower extremity was placed in longitudinal traction. The fracture was reduced using longitudinal traction and internal rotation. The adequacy of reduction was verified fluoroscopically in AP and lateral projections and found to be acceptable. The lateral aspect of the right hip and thigh were prepped with ChloraPrep solution before being draped sterilely. Preoperative IV antibiotics were administered. A timeout was performed to verify the appropriate surgical site, patient, and procedure.    The greater trochanter was identified and an approximately 6 cm incision was made about 3 fingerbreadths above the tip of the greater trochanter. The incision was carried down through the subcutaneous tissues to expose the gluteal fascia. This was split the length of the incision,  providing access to the tip of the trochanter. Under fluoroscopic guidance, a guidewire was drilled through the tip of the trochanter into the proximal metaphysis to the level of the lesser trochanter. After verifying its position fluoroscopically in AP and lateral projections, it was overreamed with the opening reamer to the level of the lesser trochanter. A guidewire was passed down through the femoral canal to the supracondylar region. The adequacy of guidewire position was verified fluoroscopically in AP and lateral projections before the length of the guidewire within the canal was measured and a nail of appropriate length was selected. The guidewire was overreamed sequentially using the flexible reamers. The appropriate sized nail was selected and advanced to the appropriate depth as verified fluoroscopically.    The guide system for the lag screw was positioned and advanced through an approximately 3cm stab incision over the lateral aspect of the proximal femur. The guidewire was drilled up through the femoral nail and into the femoral neck to rest within 5 mm of subchondral bone. After verifying its position in the femoral neck and head in both AP and lateral projections, the guidewire was measured and appropriate sized lag screw was selected. The guidewire was overreamed to the appropriate depth before the lag screw was inserted and advanced to the appropriate depth as verified fluoroscopically in AP and lateral projections. The lag screw was advanced and the locking mechanism was deployed. Again, the adequacy of hardware position and fracture reduction was verified fluoroscopically in AP and lateral projections.   Attention was then turned to the distal interlocking screw in the diaphysis. Using a targeted assembly, a stab incision was made and hole was drilled through the nail. An interlocking screw was placed with excellent purchase. Again the adequacy of screw position was verified fluoroscopically  in AP  and lateral projections.   The wounds were irrigated thoroughly with sterile saline solution. Local anesthetic was injected into the wounds. The subcutaneous tissues were closed using 2-0 Vicryl interrupted sutures. The skin was closed using staples. Sterile occlusive dressings were applied to all wounds. The patient was then transferred to the recovery room in satisfactory condition.   POSTOPERATIVE PLAN: The patient will be WBAT on the operative extremity. Lovenox 40mg /day x 4 weeks to start on POD#1. Perioperative IV antibiotics x 24 hours. PT/OT on POD#1.

## 2018-07-08 NOTE — Anesthesia Post-op Follow-up Note (Signed)
Anesthesia QCDR form completed.        

## 2018-07-08 NOTE — Consult Note (Signed)
ORTHOPAEDIC CONSULTATION  REQUESTING PHYSICIAN: Gladstone Lighter, MD  Chief Complaint:   L hip pain  History of Present Illness: Nancy Zhang is a 85 y.o. female who had a fall last night at home.  The patient noted immediate hip pain and inability to ambulate.  The patient lives alone at home and ambulates with a walker or cane at baseline.  Pain is described as sharp at its worst and a dull ache at its best.  Pain is rated a 10 out of 10 in severity.  Pain is improved with rest and immobilization.  Pain is worse with any sort of movement.  X-rays in the emergency department show a left intertrochanteric hip fracture.  Past Medical History:  Diagnosis Date  . Breast cancer ([place]) 2010   RT LUMPECTOMY  . Diabetes ([place])   . Diverticulosis   . Hypercholesterolemia   . Hypertension   . Intention tremor   . Personal history of chemotherapy 2010   BREAST CA  . Personal history of radiation therapy 2010   BREAST CA   Past Surgical History:  Procedure Laterality Date  . ABDOMINAL HYSTERECTOMY    . APPENDECTOMY    . BREAST EXCISIONAL BIOPSY Right 2010   positive   Social History   Socioeconomic History  . Marital status: Widowed    Spouse name: Not on file  . Number of children: Not on file  . Years of education: Not on file  . Highest education level: Not on file  Occupational History  . Not on file  Social Needs  . Financial resource strain: Not on file  . Food insecurity:    Worry: Not on file    Inability: Not on file  . Transportation needs:    Medical: Not on file    Non-medical: Not on file  Tobacco Use  . Smoking status: Never Smoker  . Smokeless tobacco: Never Used  Substance and Sexual Activity  . Alcohol use: No  . Drug use: No  . Sexual activity: Not on file  Lifestyle  . Physical activity:    Days per week: Not on file    Minutes per session: Not on file  . Stress: Not on file   Relationships  . Social connections:    Talks on phone: Not on file    Gets together: Not on file    Attends religious service: Not on file    Active member of club or organization: Not on file    Attends meetings of clubs or organizations: Not on file    Relationship status: Not on file  Other Topics Concern  . Not on file  Social History Narrative  . Not on file   Family History  Problem Relation Age of Onset  . Breast cancer Daughter 26   Allergies  Allergen Reactions  . Shellfish Allergy Swelling   Prior to Admission medications   Medication Sig Start Date End Date Taking? Authorizing Provider  amLODipine (NORVASC) 5 MG tablet Take 5 mg by mouth daily.     [provider]  atorvastatin (LIPITOR) 10 MG tablet Take 10 mg by mouth daily at 6 PM.     [provider]  hydrochlorothiazide (HYDRODIURIL) 25 MG tablet Take 25 mg by mouth daily.     [provider]  metFORMIN (GLUCOPHAGE-XR) 500 MG 24 hr tablet Take 500 mg by mouth 2 (two) times daily.  06/22/13   [provider]  quinapril (ACCUPRIL) 40 MG tablet Take 40 mg by  mouth daily.    [provider]   Recent Labs    07/07/18 1922 07/07/18 2016 07/08/18 0420  WBC 9.2  --  15.9*  HGB 12.3  --  11.3*  HCT 38.4  --  35.1*  PLT 262  --  256  K 3.8  --  4.5  CL 101  --  105  CO2 26  --  26  BUN 27*  --  30*  CREATININE 1.08*  --  1.29*  GLUCOSE 231*  --  244*  CALCIUM 9.5  --  9.1  INR  --  1.0  --    Dg Chest 1 View  Result Date: 07/07/2018 CLINICAL DATA:  LEFT hip fracture.  Preoperative assessment. EXAM: CHEST  1 VIEW COMPARISON:  None. FINDINGS: The heart size and mediastinal contours are within normal limits. Both lungs are clear. The visualized skeletal structures are unremarkable. IMPRESSION: No active disease. Electronically Signed   By: Staci Righter M.D.   On: 07/07/2018 19:55   Dg Hip Unilat W Or Wo Pelvis 2-3 Views Left  Result Date: 07/07/2018 CLINICAL DATA:   [place]. Pain. EXAM: DG HIP (WITH OR WITHOUT PELVIS) 2-3V LEFT COMPARISON:  None. FINDINGS: Intertrochanteric fracture LEFT hip, avulsion lesser trochanter. Roughly 1 cm distraction of the fracture fragments. Soft tissue swelling. Vascular calcification. No pelvic fracture. IMPRESSION: Intertrochanteric fracture LEFT hip. Electronically Signed   By: Staci Righter M.D.   On: 07/07/2018 19:53     Positive ROS: All other systems have been reviewed and were otherwise negative with the exception of those mentioned in the HPI and as above.  Physical Exam: BP (!) 157/63 (BP Location: Left Arm)   Pulse 84   Temp 97.8 F (36.6 C) (Oral)   Resp 18   Ht 5' (1.524 m)   Wt 72.6 kg   SpO2 98%   BMI 31.25 kg/m  General:  Alert, no acute distress Psychiatric:  Patient is competent for consent with normal mood and affect   Cardiovascular:  No pedal edema, regular rate and rhythm Respiratory:  No wheezing, non-labored breathing GI:  Abdomen is soft and non-tender Skin:  No lesions in the area of chief complaint, no erythema Neurologic:  Sensation intact distally, CN grossly intact Lymphatic:  No axillary or cervical lymphadenopathy  Orthopedic Exam:  LLE: + DF/PF/EHL SILT grossly over foot Foot wwp +Log roll/axial load   X-rays:  As above: L intertrochanteric hip fracture  Assessment/Plan:   Nancy Zhang is a 85 y.o. female with a L intertrochanteric hip fracture   1. I discussed the various treatment options including both surgical and non-surgical management of her fracture with the patient. We discussed the high risk of perioperative complications due to patient's age and other co-morbidities. After discussion of risks, benefits, and alternatives to surgery, the family and patient were in agreement to proceed with surgery. The goals of surgery would be to provide adequate pain relief and allow for mobilization. Plan for surgery is L hip cephalomedullary nailing today, 07/08/2018. 2. NPO  until OR 3. Hold anticoagulation in advance of OR          Leim Fabry   07/08/2018 11:18 AM

## 2018-07-08 NOTE — Plan of Care (Signed)
Pt placed in bed after admittance from ED. Pt education on position of legs/hip and safety. Teaching reinforcement needed. Pt keeps moving pillows and lying on affected side. Pdowless,rn 07/06/2018

## 2018-07-08 NOTE — Transfer of Care (Signed)
Immediate Anesthesia Transfer of Care Note  Patient: Nancy Zhang  Procedure(s) Performed: INTRAMEDULLARY (IM) NAIL INTERTROCHANTRIC (Left Hip)  Patient Location: PACU  Anesthesia Type:Spinal  Level of Consciousness: sedated  Airway & Oxygen Therapy: Patient Spontanous Breathing and Patient connected to face mask oxygen  Post-op Assessment: Report given to RN and Post -op Vital signs reviewed and stable  Post vital signs: Reviewed and stable  Last Vitals:  Vitals Value Taken Time  BP  07/08/2018  1:41 PM  Temp    Pulse 113 07/08/2018  1:42 PM  Resp 20 07/08/2018  1:42 PM  SpO2 100 % 07/08/2018  1:42 PM  Vitals shown include unvalidated device data.  Last Pain:  Vitals:   07/08/18 1129  TempSrc: Tympanic  PainSc:          Complications: No apparent anesthesia complications

## 2018-07-08 NOTE — Progress Notes (Signed)
Pt found sitting up on side of bed with feet dangling. Pt reminded of hip precautions and pt stated it wasn't bothering her. Explained to pt the need to prevent further damage or pain and pt laid back down in bed. Pillow put between leg and HOB 30 degrees. Will continue to monitor for safety. Pdowless, rn 07/08/2018

## 2018-07-08 NOTE — H&P (Signed)
H&P reviewed. No significant changes noted.  

## 2018-07-08 NOTE — Anesthesia Procedure Notes (Signed)
Spinal  Patient location during procedure: OR Start time: 07/08/2018 11:40 AM End time: 07/08/2018 12:03 PM Staffing Anesthesiologist: Martha Clan, MD Resident/CRNA: Dionne Bucy, CRNA Performed: resident/CRNA  Preanesthetic Checklist Completed: patient identified, site marked, surgical consent, pre-op evaluation, timeout performed, IV checked, risks and benefits discussed and monitors and equipment checked Spinal Block Patient position: right lateral decubitus Prep: ChloraPrep Patient monitoring: heart rate, continuous pulse ox, blood pressure and cardiac monitor Approach: midline Location: L3-4 Injection technique: single-shot Needle Needle type: Introducer and Pencan  Needle gauge: 24 G Needle length: 10 cm Assessment Sensory level: T6 Additional Notes Negative paresthesia. Negative blood return. Positive free-flowing CSF. Expiration date of kit checked and confirmed. Patient tolerated procedure well, without complications.

## 2018-07-08 NOTE — Anesthesia Preprocedure Evaluation (Signed)
Anesthesia Evaluation  Patient identified by MRN, date of birth, ID band Patient awake    Reviewed: Allergy & Precautions, H&P , NPO status , Patient's Chart, lab work & pertinent test results, reviewed documented beta blocker date and time   History of Anesthesia Complications Negative for: history of anesthetic complications  Airway Mallampati: II  TM Distance: >3 FB Neck ROM: full    Dental  (+) Dental Advidsory Given, Missing, Poor Dentition   Pulmonary neg pulmonary ROS,           Cardiovascular Exercise Tolerance: Good hypertension, (-) angina(-) CAD, (-) Past MI, (-) Cardiac Stents and (-) CABG (-) dysrhythmias (-) Valvular Problems/Murmurs     Neuro/Psych neg Seizures  Neuromuscular disease (Intention tremor) negative psych ROS   GI/Hepatic negative GI ROS, Neg liver ROS,   Endo/Other  diabetes  Renal/GU Renal disease  negative genitourinary   Musculoskeletal   Abdominal   Peds  Hematology negative hematology ROS (+)   Anesthesia Other Findings Past Medical History: 2010: Breast cancer ([place])     Comment:  RT LUMPECTOMY No date: Diabetes ([place]) No date: Diverticulosis No date: Hypercholesterolemia No date: Hypertension No date: Intention tremor 2010: Personal history of chemotherapy     Comment:  BREAST CA 2010: Personal history of radiation therapy     Comment:  BREAST CA   Reproductive/Obstetrics negative OB ROS                             Anesthesia Physical Anesthesia Plan  ASA: III  Anesthesia Plan: Spinal   Post-op Pain Management:    Induction:   PONV Risk Score and Plan: 3 and Propofol infusion and TIVA  Airway Management Planned: Natural Airway and Simple Face Mask  Additional Equipment:   Intra-op Plan:   Post-operative Plan:   Informed Consent: I have reviewed the patients History and Physical, chart, labs and discussed the procedure including the  risks, benefits and alternatives for the proposed anesthesia with the patient or authorized representative who has indicated his/her understanding and acceptance.     Dental Advisory Given  Plan Discussed with: Anesthesiologist, CRNA and Surgeon  Anesthesia Plan Comments:         Anesthesia Quick Evaluation

## 2018-07-08 NOTE — Progress Notes (Signed)
PHARMACIST - PHYSICIAN COMMUNICATION  CONCERNING:  Enoxaparin (Lovenox) for DVT Prophylaxis    RECOMMENDATION: Patient was prescribed enoxaprin 40mg  q24 hours for VTE prophylaxis.   Filed Weights   07/07/18 1909  Weight: 160 lb (72.6 kg)    Body mass index is 31.25 kg/m.  Estimated Creatinine Clearance: 25.8 mL/min (A) (by C-G formula based on SCr of 1.29 mg/dL (H)).   Patient is candidate for enoxaparin 30mg  every 24 hours based on CrCl <1ml/min   DESCRIPTION: Pharmacy has adjusted enoxaparin dose per [facility] policy.  Patient is now receiving enoxaparin 30mg  every 24 hours.  Lu Duffel, PharmD, BCPS Clinical Pharmacist 07/08/2018 4:34 PM

## 2018-07-09 LAB — BASIC METABOLIC PANEL
Anion gap: 9 (ref 5–15)
BUN: 27 mg/dL — ABNORMAL HIGH (ref 8–23)
CO2: 24 mmol/L (ref 22–32)
Calcium: 8.2 mg/dL — ABNORMAL LOW (ref 8.9–10.3)
Chloride: 104 mmol/L (ref 98–111)
Creatinine, Ser: 1.26 mg/dL — ABNORMAL HIGH (ref 0.44–1.00)
GFR calc Af Amer: 43 mL/min — ABNORMAL LOW (ref >60)
GFR calc non Af Amer: 37 mL/min — ABNORMAL LOW (ref >60)
Glucose, Bld: 199 mg/dL — ABNORMAL HIGH (ref 70–99)
Potassium: 3.9 mmol/L (ref 3.5–5.1)
Sodium: 137 mmol/L (ref 135–145)

## 2018-07-09 LAB — CBC
HCT: 28.6 % — ABNORMAL LOW (ref 36.0–46.0)
Hemoglobin: 9.1 g/dL — ABNORMAL LOW (ref 12.0–15.0)
MCH: 27.9 pg (ref 26.0–34.0)
MCHC: 31.8 g/dL (ref 30.0–36.0)
MCV: 87.7 fL (ref 80.0–100.0)
Platelets: 194 10*3/uL (ref 150–400)
RBC: 3.26 MIL/uL — ABNORMAL LOW (ref 3.87–5.11)
RDW: 13.2 % (ref 11.5–15.5)
WBC: 13.5 10*3/uL — ABNORMAL HIGH (ref 4.0–10.5)
nRBC: 0 % (ref 0.0–0.2)

## 2018-07-09 LAB — GLUCOSE, CAPILLARY
Glucose-Capillary: 188 mg/dL — ABNORMAL HIGH (ref 70–99)
Glucose-Capillary: 160 mg/dL — ABNORMAL HIGH (ref 70–99)
Glucose-Capillary: 130 mg/dL — ABNORMAL HIGH (ref 70–99)
Glucose-Capillary: 125 mg/dL — ABNORMAL HIGH (ref 70–99)

## 2018-07-09 LAB — SARS CORONAVIRUS 2 BY RT PCR (HOSPITAL ORDER, PERFORMED IN ~~LOC~~ HOSPITAL LAB): SARS Coronavirus 2: NEGATIVE

## 2018-07-09 MED ORDER — CEPHALEXIN 250 MG PO CAPS
250.0000 mg | ORAL_CAPSULE | Freq: Three times a day (TID) | ORAL | Status: DC
  Administered 2018-07-09 – 2018-07-11 (×6): 250 mg via ORAL
  Filled 2018-07-09 (×8): qty 1

## 2018-07-09 MED ORDER — LACTULOSE 10 GM/15ML PO SOLN
30.0000 g | Freq: Every day | ORAL | Status: DC | PRN
  Administered 2018-07-10: 12:00:00 30 g via ORAL
  Filled 2018-07-09: qty 60

## 2018-07-09 MED ORDER — CEPHALEXIN 250 MG PO CAPS: 250.0000 mg | ORAL_CAPSULE | Freq: Three times a day (TID) | ORAL | Status: DC

## 2018-07-09 MED ORDER — FLEET ENEMA 7-19 GM/118ML RE ENEM: 1.0000 | ENEMA | Freq: Once | RECTAL | Status: DC | PRN

## 2018-07-09 NOTE — Anesthesia Postprocedure Evaluation (Signed)
Anesthesia Post Note  Patient: Nancy Zhang  Procedure(s) Performed: INTRAMEDULLARY (IM) NAIL INTERTROCHANTRIC (Left Hip)  Patient location during evaluation: Other Anesthesia Type: Spinal Level of consciousness: awake and alert and oriented Pain management: pain level controlled Vital Signs Assessment: post-procedure vital signs reviewed and stable Respiratory status: spontaneous breathing Cardiovascular status: blood pressure returned to baseline Postop Assessment: no backache Anesthetic complications: no     Last Vitals:  Vitals:   07/09/18 0527 07/09/18 1454  BP: (!) 141/53 (!) 107/48  Pulse: 97 87  Resp: 20 15  Temp: 37.1 C 37.2 C  SpO2: 91% 91%    Last Pain:  Vitals:   07/09/18 1454  TempSrc: Oral  PainSc:                  Zaila Crew

## 2018-07-09 NOTE — Progress Notes (Signed)
Patient resting in bed eating dinner. Alert but confused, VSS. No complaints of pain. Bed low, alarm on, call bell in reach. Continue to monitor.

## 2018-07-09 NOTE — TOC Initial Note (Signed)
Transition of Care [facility]) - Initial/Assessment Note    Patient Details  Name: Nancy Zhang MRN: [id number] Date of Birth: Dec 09, 1925  Transition of Care [facility]) CM/SW Contact:    Annamaria Boots, [place] Phone Number: 07/09/2018, 4:10 PM  Clinical Narrative: CSW consulted for SNF placement. CSW spoke with patient's daughter Melina Fiddler [phone]. Daughter reports that patient lives alone in an apartment and is independent. Daughter states that patient normally uses a cane and also has a walker. Patient also receives meals on wheels and has a life alert button. CSW explained PT recommendation of SNF. Daughter is in agreement and states that she and her brother have already discussed this as the next step. Daughter gave permission for SNF bed search. Daughter also asked that her brother Kerrie Timm [phone] or [phone] be contacted with bed offers as well. CSW will begin bed search and give offers once received. CSW will continue to follow for discharge planning                   Expected Discharge Plan: [place]     Patient Goals and CMS Choice        Expected Discharge Plan and Services Expected Discharge Plan: [place]       Living arrangements for the past 2 months: Apartment                                      Prior Living Arrangements/Services Living arrangements for the past 2 months: Apartment Lives with:: Self Patient language and need for interpreter reviewed:: Yes Do you feel safe going back to the place where you live?: Yes      Need for Family Participation in Patient Care: Yes (Comment) Care giver support system in place?: Yes (comment) Current home services: Meals on wheels, Safety alert Criminal Activity/Legal Involvement Pertinent to Current Situation/Hospitalization: No - Comment as needed  Activities of Daily Living Home Assistive Devices/Equipment: None ADL Screening (condition at time of  admission) Patient's cognitive ability adequate to safely complete daily activities?: Yes Is the patient deaf or have difficulty hearing?: No Does the patient have difficulty seeing, even when wearing glasses/contacts?: No Does the patient have difficulty concentrating, remembering, or making decisions?: No Patient able to express need for assistance with ADLs?: Yes Does the patient have difficulty dressing or bathing?: No Independently performs ADLs?: Yes (appropriate for developmental age) Does the patient have difficulty walking or climbing stairs?: Yes(due to fx hip) Weakness of Legs: Left(due to fx) Weakness of Arms/Hands: None  Permission Sought/Granted Permission sought to share information with : Case Manager, Customer service manager, Family Supports Permission granted to share information with : Yes, Verbal Permission Granted              Emotional Assessment Appearance:: Appears stated age Attitude/Demeanor/Rapport: Lethargic Affect (typically observed): Quiet Orientation: : Oriented to Self, Oriented to Place Alcohol / Substance Use: Not Applicable Psych Involvement: No (comment)  Admission diagnosis:  Fall [W19.XXXA] Closed displaced intertrochanteric fracture of left femur, initial encounter [facility]) [S72.142A] Patient Active Problem List   Diagnosis Date Noted  . Closed left hip fracture ([place]) 07/07/2018  . Osteoporosis 12/21/2017  . Primary cancer of upper outer quadrant of right female breast ([place]) 12/03/2015   PCP:  [employer], [place]:   [place], [place]  [place] [address] Phone: [phone] Fax: [phone]  [id number] [place], [place] - [address] [place] [address] [place] [address] Phone: [phone] Fax: [phone]     Social Determinants of Health (SDOH) Interventions    Readmission Risk Interventions No flowsheet data found.

## 2018-07-09 NOTE — Evaluation (Signed)
Physical Therapy Evaluation Patient Details Name: LANAH STEINES MRN: [id number] DOB: 02-Mar-1926 Today's Date: 07/09/2018   History of Present Illness  REWA WEISSBERG is a 85 y.o. female who sustained an intertrochanteric fracture after a fall walking to refrigerator. Intramedullary nail placement 07/08/18 Dr Rudene Christians. PMH of DM, HTN, diverticulosis.   Clinical Impression  PLOF patient reports she was ind in ambulation and ADLS with rollator and quad cane. Patient requiring modA with Supine to sit for LLE management and with trunk management d/t core weakness. Patient able to stand on 2nd STS attempt with RW and modA from PT with heavy cuing needed for safety and setup. Patient with good effort and proper UE placement following cuing during bed>chair transfer, but max physical assistance needed with difficulty controlling descent. Ambulation unable to be completed at this time d/t safety concerns. Would benefit from skilled PT to address above deficits and promote optimal return to PLOF     Follow Up Recommendations SNF    Equipment Recommendations  Standard walker;3in1 (PT)    Recommendations for Other Services Rehab consult     Precautions / Restrictions Precautions Precautions: Fall Restrictions Weight Bearing Restrictions: Yes LLE Weight Bearing: Weight bearing as tolerated      Mobility  Bed Mobility Overal bed mobility: Needs Assistance Bed Mobility: Supine to Sit     Supine to sit: Mod assist;HOB elevated     General bed mobility comments: Patient requires assistance with LLE management and trunk management, core weakness inhibiting patients ability to complete transfer with PT assisting LLE  Transfers Overall transfer level: Needs assistance Equipment used: Rolling walker (2 wheeled) Transfers: Sit to/from [facility] Sit to Stand: Mod assist;From elevated surface Stand pivot transfers: Max assist;From elevated surface       General transfer  comment: Patient unable to complete stand with first attempt. Following PT cuing for set up patient able to complete with mod assist, able to remain standing for a few second before modA to lower to bed with control. MaxA bed> chair   Ambulation/Gait         Gait velocity: decreased   General Gait Details: Not able to ambulate to date  Stairs            Wheelchair Mobility    Modified Rankin (Stroke Patients Only)       Balance Overall balance assessment: Needs assistance Sitting-balance support: No upper extremity supported;Feet supported Sitting balance-Leahy Scale: Fair       Standing balance-Leahy Scale: Zero                               Pertinent Vitals/Pain Pain Assessment: Faces Faces Pain Scale: Hurts even more Pain Location: L hip  Pain Descriptors / Indicators: Aching Pain Intervention(s): Limited activity within patient's tolerance    Home Living Family/patient expects to be discharged to:: Private residence Living Arrangements: Children Available Help at Discharge: Family Type of Home: Apartment Home Access: Level entry     Home Layout: One level Home Equipment: Environmental consultant - 4 wheels;Cane - quad Additional Comments: Patient reports she "sometimes used rollator or quad cane" in home, unable to recall if she was using AD when she fell     Prior Function Level of Independence: Needs assistance      ADL's / Homemaking Assistance Needed: Reports daughter does cleaning, but reports that she does cook        Hand Dominance  Extremity/Trunk Assessment   Upper Extremity Assessment Upper Extremity Assessment: Overall WFL for tasks assessed    Lower Extremity Assessment Lower Extremity Assessment: Generalized weakness    Cervical / Trunk Assessment Cervical / Trunk Assessment: Normal  Communication   Communication: No difficulties  Cognition Arousal/Alertness: Awake/alert Behavior During Therapy: WFL for tasks  assessed/performed Overall Cognitive Status: Within Functional Limits for tasks assessed                                        General Comments      Exercises Other Exercises Other Exercises: Patietn unable to perform SLR initiation, is able to complete min hip flexion in sitting and 50% knee ext in sitting. Patient initially unable to complete STS transcfer, with HOB elevated and PT cuing patient able to complete modA with max cuing for controlled lowering. Max to chair with good effort and good UE placement following PT cuing.    Assessment/Plan    PT Assessment Patient needs continued PT services  PT Problem List Decreased strength;Decreased balance;Pain;Decreased range of motion;Decreased mobility;Decreased activity tolerance;Decreased coordination;Decreased safety awareness       PT Treatment Interventions Gait training;Therapeutic activities;Neuromuscular re-education;Stair training;Therapeutic exercise;Manual techniques;Functional mobility training;Patient/family education    PT Goals (Current goals can be found in the Care Plan section)  Acute Rehab PT Goals Patient Stated Goal: Return home independently  PT Goal Formulation: With patient Time For Goal Achievement: 07/23/18 Potential to Achieve Goals: Fair    Frequency BID   Barriers to discharge        Co-evaluation               AM-PAC PT "6 Clicks" Mobility  Outcome Measure Help needed turning from your back to your side while in a flat bed without using bedrails?: A Little Help needed moving from lying on your back to sitting on the side of a flat bed without using bedrails?: A Lot Help needed moving to and from a bed to a chair (including a wheelchair)?: A Lot Help needed standing up from a chair using your arms (e.g., wheelchair or bedside chair)?: A Lot Help needed to walk in hospital room?: Total Help needed climbing 3-5 steps with a railing? : Total 6 Click Score: 11    End of  Session Equipment Utilized During Treatment: Gait belt   Patient left: in chair;with chair alarm set;with call bell/phone within reach Nurse Communication: Mobility status(To check chair alarm) PT Visit Diagnosis: Unsteadiness on feet (R26.81);Other abnormalities of gait and mobility (R26.89);History of falling (Z91.81);Muscle weakness (generalized) (M62.81);Difficulty in walking, not elsewhere classified (R26.2);Pain Pain - Right/Left: Left Pain - part of body: Hip    Time: 8889-1694 PT Time Calculation (min) (ACUTE ONLY): 34 min   Charges:   PT Evaluation $PT Eval Moderate Complexity: 1 Mod PT Treatments $Therapeutic Activity: 23-37 mins      Shelton Silvas PT, DPT  Shelton Silvas 07/09/2018, 10:36 AM

## 2018-07-09 NOTE — Progress Notes (Signed)
This nurse resumed care at 1447.

## 2018-07-09 NOTE — Progress Notes (Addendum)
  Subjective: 1 Day Post-Op Procedure(s) (LRB): INTRAMEDULLARY (IM) NAIL INTERTROCHANTRIC (Left) Patient reports pain as mild.   Patient seen in rounds with Dr. Roland Rack. Patient is well, and has had no acute complaints or problems Plan is to go Rehab after hospital stay. Negative for chest pain and shortness of breath Fever: no Gastrointestinal: Negative for nausea and vomiting  Objective: Vital signs in last 24 hours: Temp:  [97.7 F (36.5 C)-100.1 F (37.8 C)] 98.8 F (37.1 C) (04/25 0527) Pulse Rate:  [85-117] 97 (04/25 0527) Resp:  [16-28] 20 (04/25 0527) BP: (111-169)/(50-99) 141/53 (04/25 0527) SpO2:  [87 %-100 %] 91 % (04/25 0527)  Intake/Output from previous day:  Intake/Output Summary (Last 24 hours) at 07/09/2018 0701 Last data filed at 07/09/2018 0700 Gross per 24 hour  Intake 1950.64 ml  Output 825 ml  Net 1125.64 ml    Intake/Output this shift: No intake/output data recorded.  Labs: Recent Labs    07/07/18 1922 07/08/18 0420 07/09/18 0603  HGB 12.3 11.3* 9.1*   Recent Labs    07/08/18 0420 07/09/18 0603  WBC 15.9* 13.5*  RBC 4.03 3.26*  HCT 35.1* 28.6*  PLT 256 194   Recent Labs    07/08/18 0420 07/09/18 0603  NA 142 137  K 4.5 3.9  CL 105 104  CO2 26 24  BUN 30* 27*  CREATININE 1.29* 1.26*  GLUCOSE 244* 199*  CALCIUM 9.1 8.2*   Recent Labs    07/07/18 2016  INR 1.0     EXAM General - Patient is Alert and Oriented Extremity - Sensation intact distally Dorsiflexion/Plantar flexion intact Compartment soft Dressing/Incision - clean, dry, no drainage Motor Function - intact, moving foot and toes well on exam.   Past Medical History:  Diagnosis Date  . Breast cancer ([place]) 2010   RT LUMPECTOMY  . Diabetes ([place])   . Diverticulosis   . Hypercholesterolemia   . Hypertension   . Intention tremor   . Personal history of chemotherapy 2010   BREAST CA  . Personal history of radiation therapy 2010   BREAST CA     Assessment/Plan: 1 Day Post-Op Procedure(s) (LRB): INTRAMEDULLARY (IM) NAIL INTERTROCHANTRIC (Left) Active Problems:   Closed left hip fracture (HCC)  Estimated body mass index is 31.25 kg/m as calculated from the following:   Height as of this encounter: 5' (1.524 m).   Weight as of this encounter: 72.6 kg. Advance diet Up with therapy D/C IV fluids Discharge to SNF when cleared by medicine. Follow-up at [facility] clinic orthopedics in 2 weeks  DVT Prophylaxis - Lovenox, Foot Pumps and TED hose Weight-Bearing as tolerated to left leg  Reche Dixon, PA-C Orthopaedic Surgery 07/09/2018, 7:01 AM

## 2018-07-09 NOTE — NC FL2 (Signed)
[place] LEVEL OF CARE SCREENING TOOL     IDENTIFICATION  Patient Name: Nancy Zhang Birthdate: 1925/11/13 Sex: female Admission Date (Current Location): 07/07/2018  [place] and [place] Number:  Engineering geologist and Address:  [facility], [address], [place], [place] [address]      Provider Number: [id number]  Attending Physician Name and Address:  Loletha Grayer, MD  Relative Name and Phone Number:       Current Level of Care: Hospital Recommended Level of Care: [place] Prior Approval Number:    Date Approved/Denied:   PASRR Number:    Discharge Plan: SNF    Current Diagnoses: Patient Active Problem List   Diagnosis Date Noted  . Closed left hip fracture ([place]) 07/07/2018  . Osteoporosis 12/21/2017  . Primary cancer of upper outer quadrant of right female breast ([place]) 12/03/2015    Orientation RESPIRATION BLADDER Height & Weight     Self, Place, Time  Normal Continent Weight: 160 lb (72.6 kg) Height:  5' (152.4 cm)  BEHAVIORAL SYMPTOMS/MOOD NEUROLOGICAL BOWEL NUTRITION STATUS  (none) (none) Continent Diet(Carb Modified )  AMBULATORY STATUS COMMUNICATION OF NEEDS Skin   Extensive Assist Verbally Surgical wounds(Left Hip )                       Personal Care Assistance Level of Assistance  Bathing, Feeding, Dressing Bathing Assistance: Limited assistance Feeding assistance: Independent Dressing Assistance: Limited assistance     Functional Limitations Info  Sight, Hearing, Speech Sight Info: Adequate Hearing Info: Adequate Speech Info: Adequate    SPECIAL CARE FACTORS FREQUENCY  PT (By licensed PT), OT (By licensed OT)     PT Frequency: 5 OT Frequency: 5            Contractures Contractures Info: Not present    Additional Factors Info  Code Status, Allergies Code Status Info: Full Code  Allergies Info: Shellfish            Current Medications (07/09/2018):  This  is the current hospital active medication list Current Facility-Administered Medications  Medication Dose Route Frequency Provider Last Rate Last Dose  . alum & mag hydroxide-simeth (MAALOX/MYLANTA) 200-200-20 MG/5ML suspension 30 mL  30 mL Oral Q6H PRN Gladstone Lighter, MD   30 mL at 07/09/18 0400  . amLODipine (NORVASC) tablet 5 mg  5 mg Oral Daily Leim Fabry, MD   5 mg at 07/09/18 0820  . atorvastatin (LIPITOR) tablet 10 mg  10 mg Oral q1800 Leim Fabry, MD   10 mg at 07/08/18 1733  . bisacodyl (DULCOLAX) suppository 10 mg  10 mg Rectal Daily PRN Leim Fabry, MD      . cephALEXin [facility]) capsule 250 mg  250 mg Oral TID Loletha Grayer, MD      . docusate sodium (COLACE) capsule 100 mg  100 mg Oral BID Leim Fabry, MD   100 mg at 07/09/18 0820  . enoxaparin (LOVENOX) injection 30 mg  30 mg Subcutaneous Q24H Lu Duffel, RPH   30 mg at 07/09/18 5465  . hydrALAZINE (APRESOLINE) injection 5 mg  5 mg Intravenous Q4H PRN Leim Fabry, MD      . HYDROmorphone (DILAUDID) injection 0.25-0.5 mg  0.25-0.5 mg Intravenous Q2H PRN Leim Fabry, MD      . insulin aspart (novoLOG) injection 0-5 Units  0-5 Units Subcutaneous QHS Leim Fabry, MD   Stopped at 07/07/18 2355  . insulin aspart (novoLOG) injection 0-9 Units  0-9 Units Subcutaneous TID WC Leim Fabry, MD   2 Units at 07/09/18 1223  . lactulose (CHRONULAC) 10 GM/15ML solution 30 g  30 g Oral Daily PRN Loletha Grayer, MD      . metFORMIN (GLUCOPHAGE) tablet 500 mg  500 mg Oral BID WC Gladstone Lighter, MD   500 mg at 07/09/18 0819  . methocarbamol (ROBAXIN) tablet 500 mg  500 mg Oral Q6H PRN Leim Fabry, MD       Or  . methocarbamol (ROBAXIN) 500 mg in dextrose 5 % 50 mL IVPB  500 mg Intravenous Q6H PRN Leim Fabry, MD      . metoCLOPramide (REGLAN) tablet 5-10 mg  5-10 mg Oral Q8H PRN Leim Fabry, MD   5 mg at 07/09/18 5449   Or  . metoCLOPramide (REGLAN) injection 5-10 mg  5-10 mg Intravenous Q8H PRN Leim Fabry, MD       . ondansetron [facility]) tablet 4 mg  4 mg Oral Q6H PRN Leim Fabry, MD       Or  . ondansetron [facility]) injection 4 mg  4 mg Intravenous Q6H PRN Leim Fabry, MD   4 mg at 07/09/18 0631  . oxyCODONE (Oxy IR/ROXICODONE) immediate release tablet 2.5-5 mg  2.5-5 mg Oral Q3H PRN Leim Fabry, MD      . oxyCODONE (Oxy IR/ROXICODONE) immediate release tablet 5-10 mg  5-10 mg Oral Q4H PRN Leim Fabry, MD      . quinapril (ACCUPRIL) tablet 40 mg  40 mg Oral Daily Leim Fabry, MD   40 mg at 07/09/18 0820  . senna-docusate (Senokot-S) tablet 1 tablet  1 tablet Oral QHS PRN Leim Fabry, MD      . sodium phosphate (FLEET) 7-19 GM/118ML enema 1 enema  1 enema Rectal Once PRN Loletha Grayer, MD      . traMADol [facility]) tablet 50 mg  50 mg Oral Q6H PRN Leim Fabry, MD   50 mg at 07/09/18 2010     Discharge Medications: Please see discharge summary for a list of discharge medications.  Relevant Imaging Results:  Relevant Lab Results:   Additional Information SSN: [id number]  Annamaria Boots, [place]

## 2018-07-09 NOTE — Evaluation (Signed)
Occupational Therapy Evaluation Patient Details Name: Nancy Zhang MRN: [id number] DOB: [birth year] Today's Date: 07/09/2018    History of Present Illness Nancy Zhang is a 85 y.o. female who sustained an intertrochanteric fracture after a fall walking to refrigerator. Intramedullary nail placement 07/08/18 Dr Rudene Christians. PMH of DM, HTN, diverticulosis.    Clinical Impression   Pt seen for OT evaluation this date, POD#1 from above surgery. Pt was independent in all ADL prior to surgery, taking tub baths, and reports uses QC in her home and takes her 2WW to the mailbox. Pt's daughter drives and assists with groceries and her son assists with medication mgt. Pt endorses 1 fall in past 12 months 2/2 not using her QC and pt reports that her daughter reminds her all the time to use her QC. Pt is eager to return to PLOF with less pain and improved safety and independence. Pt currently requires MAX assist for LB dressing and bathing while in seated position due to pain and limited AROM of L hip. Pt instructed in self care skills, falls prevention strategies, home/routines modifications, and DME/AE for LB bathing and dressing tasks. Pt would benefit from additional instruction in self care skills and techniques to help maintain precautions with or without assistive devices to support recall and carryover prior to discharge. Recommend STR upon discharge.     Follow Up Recommendations  SNF    Equipment Recommendations       Recommendations for Other Services       Precautions / Restrictions Precautions Precautions: Fall Restrictions Weight Bearing Restrictions: Yes LLE Weight Bearing: Weight bearing as tolerated      Mobility Bed Mobility     General bed mobility comments: deferred, pt up in recliner at start and end of session  Transfers Overall transfer level: Needs assistance Equipment used: Rolling walker (2 wheeled) Transfers: Sit to/from Stand Sit to Stand: Max assist;From elevated  surface Stand pivot transfers: Max assist;From elevated surface           Balance Overall balance assessment: Needs assistance Sitting-balance support: No upper extremity supported;Feet supported Sitting balance-Leahy Scale: Fair       Standing balance-Leahy Scale: Zero                             ADL either performed or assessed with clinical judgement   ADL Overall ADL's : Needs assistance/impaired Eating/Feeding: Sitting;Independent   Grooming: Sitting;Independent   Upper Body Bathing: Sitting;Minimal assistance   Lower Body Bathing: Sit to/from stand;Maximal assistance   Upper Body Dressing : Sitting;Minimal assistance   Lower Body Dressing: Sit to/from stand;Maximal assistance   Toilet Transfer: BSC;Stand-pivot;RW;Maximal assistance                   Vision Baseline Vision/History: Wears glasses Wears Glasses: Reading only Patient Visual Report: No change from baseline       Perception     Praxis      Pertinent Vitals/Pain Pain Assessment: No/denies pain Faces Pain Scale: Hurts even more Pain Location: L hip  Pain Descriptors / Indicators: Aching Pain Intervention(s): Monitored during session     Hand Dominance Right   Extremity/Trunk Assessment Upper Extremity Assessment Upper Extremity Assessment: Overall WFL for tasks assessed(WFL for age)   Lower Extremity Assessment Lower Extremity Assessment: Generalized weakness(WFL for age, L hip pain limiting ability to assess)   Cervical / Trunk Assessment Cervical / Trunk Assessment: Normal   Communication Communication Communication: No  difficulties   Cognition Arousal/Alertness: Awake/alert Behavior During Therapy: WFL for tasks assessed/performed Overall Cognitive Status: Within Functional Limits for tasks assessed                                 General Comments: alert and oriented, follows all commands   General Comments       Exercises Other  Exercises Other Exercises: pt instructed in falls prevention strategies, AE/DME to improve indep/safety with ADL and ADL mobility   Shoulder Instructions      Home Living Family/patient expects to be discharged to:: Private residence Living Arrangements: Children Available Help at Discharge: Family Type of Home: Apartment Home Access: Level entry     Home Layout: One level     Bathroom Shower/Tub: Teacher, early years/pre: Standard     Home Equipment: Environmental consultant - 4 wheels;Cane - quad;Bedside commode;Shower seat   Additional Comments: pt has shower chair and BSC but doesn't use either, per pt report      Prior Functioning/Environment Level of Independence: Needs assistance  Gait / Transfers Assistance Needed: Patient reports she "sometimes used rollator or quad cane" in home, unable to recall if she was using AD when she fell  ADL's / Homemaking Assistance Needed: Reports daughter does cleaning, groceries, and transportation; son assists with medication mgt; pt reports able to perform basic ADL and meal prep by herself; pt states she takes a tub bath and has no difficulty getting up out of the tub afterwards   Comments: endorses only this 1 fall in past 12 months because she wasn't using her QC. Pt reports daughter has to "remind me all the time to use my cane"        OT Problem List: Decreased activity tolerance;Decreased knowledge of use of DME or AE;Decreased strength;Pain;Impaired balance (sitting and/or standing)      OT Treatment/Interventions: Self-care/ADL training;Balance training;Therapeutic exercise;Therapeutic activities;DME and/or AE instruction;Patient/family education    OT Goals(Current goals can be found in the care plan section) Acute Rehab OT Goals Patient Stated Goal: Return home independently  OT Goal Formulation: With patient Time For Goal Achievement: 07/23/18 Potential to Achieve Goals: Good ADL Goals Pt Will Perform Lower Body Dressing:  with mod assist;sit to/from stand;with adaptive equipment Pt Will Transfer to Toilet: with mod assist;bedside commode;ambulating(LRAD for amb) Additional ADL Goal #1: Pt will perform seated grooming tasks EOB with supervision and set up with no seated LOB.  OT Frequency: Min 2X/week   Barriers to D/C:            Co-evaluation              AM-PAC OT "6 Clicks" Daily Activity     Outcome Measure Help from another person eating meals?: None Help from another person taking care of personal grooming?: None Help from another person toileting, which includes using toliet, bedpan, or urinal?: A Lot Help from another person bathing (including washing, rinsing, drying)?: A Lot Help from another person to put on and taking off regular upper body clothing?: A Little Help from another person to put on and taking off regular lower body clothing?: A Lot 6 Click Score: 17   End of Session    Activity Tolerance: Patient tolerated treatment well Patient left: in chair;with call bell/phone within reach;with chair alarm set;with SCD's reapplied  OT Visit Diagnosis: Other abnormalities of gait and mobility (R26.89);History of falling (Z91.81);Pain;Muscle weakness (generalized) (M62.81) Pain - Right/Left:  Left Pain - part of body: Hip                Time: 1002-1018 OT Time Calculation (min): 16 min Charges:  OT General Charges $OT Visit: 1 Visit OT Evaluation $OT Eval Low Complexity: 1 Low OT Treatments $Self Care/Home Management : 8-22 mins  Jeni Salles, MPH, MS, OTR/L ascom [phone] 07/09/18, 11:35 AM

## 2018-07-09 NOTE — Progress Notes (Signed)
Dressing on patient hip was saturated with bright red blood but no drainage noted on the pad. Dressing reinforced with ABD pad. Will continue to monitor and report to MD. ABT in progress with no adverse reaction.

## 2018-07-09 NOTE — Progress Notes (Signed)
Physical Therapy Treatment Patient Details Name: Nancy Zhang MRN: [id number] DOB: [birth year] Today's Date: 07/09/2018    History of Present Illness Nancy Zhang is a 85 y.o. female who sustained an intertrochanteric fracture after a fall walking to refrigerator. Intramedullary nail placement 07/08/18 Dr Rudene Christians. PMH of DM, HTN, diverticulosis.     PT Comments    Patient requires min cuing for RW management set up with effort through STS but maxA required. Patient unable to complete stand pivot with safety with RW, PT assisted patient with controlled lowering to chair and completed stand pivot chair > bed maxA with patient bearing minimal weight through LLE. Patietn was able to sit upright with posture cuing for about 5 mins and wasx able to complete seated hip flex at min range before fatigue dictating her lying down. Patient completed supine exercises with min range and AAROM. PT educated patient on importance of therex on progress  Follow Up Recommendations  SNF     Equipment Recommendations  Standard walker;3in1 (PT)    Recommendations for Other Services Rehab consult     Precautions / Restrictions Precautions Precautions: Fall Restrictions Weight Bearing Restrictions: Yes LLE Weight Bearing: Weight bearing as tolerated    Mobility  Bed Mobility Overal bed mobility: Needs Assistance Bed Mobility: Sit to Supine     Supine to sit: Mod assist;HOB elevated Sit to supine: Mod assist   General bed mobility comments: Assistance with b/l LE L>R  Transfers Overall transfer level: Needs assistance Equipment used: Rolling walker (2 wheeled) Transfers: Sit to/from [employer] Sit to Stand: Max assist;From elevated surface Stand pivot transfers: Max assist;From elevated surface       General transfer comment: Cuing for set up with patient able comply and stand for few seconds with RW. PT did not utilize RW for stand pivot d/t safety  concerns  Ambulation/Gait         Gait velocity: decreased   General Gait Details: Not able to ambulate to date   Stairs             Wheelchair Mobility    Modified Rankin (Stroke Patients Only)       Balance Overall balance assessment: Needs assistance Sitting-balance support: No upper extremity supported;Feet supported Sitting balance-Leahy Scale: Fair       Standing balance-Leahy Scale: Zero                              Cognition Arousal/Alertness: Awake/alert Behavior During Therapy: WFL for tasks assessed/performed Overall Cognitive Status: Within Functional Limits for tasks assessed                                 General Comments: alert and oriented, follows all commands      Exercises General Exercises - Lower Extremity Ankle Circles/Pumps: AROM;10 reps;Both;Supine Short Arc Quad: AROM;Left;5 reps;Supine Straight Leg Raises: Left;10 reps;AAROM;Supine Hip Flexion/Marching: AAROM;10 reps;Seated;Left Other Exercises Other Exercises: min cuing for set up for stand with RW. Patient able to complete STS with RW with some effort but maxA to stand and lower with controlled descent. PT completed stand pivot wihtout RW for safety concerns. Patient able to tolerate 57min EOB with cuing for posture.  Other Exercises: pt instructed in falls prevention strategies, AE/DME to improve indep/safety with ADL and ADL mobility    General Comments        Pertinent Vitals/Pain  Pain Assessment: Faces Faces Pain Scale: Hurts whole lot Pain Location: L hip during transfer, subsides following Pain Descriptors / Indicators: Aching Pain Intervention(s): Limited activity within patient's tolerance    Home Living Family/patient expects to be discharged to:: Private residence Living Arrangements: Children Available Help at Discharge: Family Type of Home: Apartment Home Access: Level entry   Home Layout: One level Home Equipment: Environmental consultant - 4  wheels;Cane - quad;Bedside commode;Shower seat Additional Comments: pt has shower chair and BSC but doesn't use either, per pt report    Prior Function Level of Independence: Needs assistance  Gait / Transfers Assistance Needed: Patient reports she "sometimes used rollator or quad cane" in home, unable to recall if she was using AD when she fell  ADL's / Homemaking Assistance Needed: Reports daughter does cleaning, groceries, and transportation; son assists with medication mgt; pt reports able to perform basic ADL and meal prep by herself; pt states she takes a tub bath and has no difficulty getting up out of the tub afterwards Comments: endorses only this 1 fall in past 12 months because she wasn't using her QC. Pt reports daughter has to "remind me all the time to use my cane"   PT Goals (current goals can now be found in the care plan section) Acute Rehab PT Goals Patient Stated Goal: Return home independently  PT Goal Formulation: With patient Time For Goal Achievement: 07/23/18 Potential to Achieve Goals: Fair    Frequency    BID      PT Plan      Co-evaluation              AM-PAC PT "6 Clicks" Mobility   Outcome Measure  Help needed turning from your back to your side while in a flat bed without using bedrails?: A Little Help needed moving from lying on your back to sitting on the side of a flat bed without using bedrails?: A Lot Help needed moving to and from a bed to a chair (including a wheelchair)?: A Lot Help needed standing up from a chair using your arms (e.g., wheelchair or bedside chair)?: A Lot Help needed to walk in hospital room?: Total Help needed climbing 3-5 steps with a railing? : Total 6 Click Score: 11    End of Session Equipment Utilized During Treatment: Gait belt   Patient left: with call bell/phone within reach;with SCD's reapplied;with bed alarm set;in bed Nurse Communication: Mobility status PT Visit Diagnosis: Unsteadiness on feet  (R26.81);Other abnormalities of gait and mobility (R26.89);History of falling (Z91.81);Muscle weakness (generalized) (M62.81);Difficulty in walking, not elsewhere classified (R26.2);Pain Pain - Right/Left: Left Pain - part of body: Hip     Time: 5643-3295 PT Time Calculation (min) (ACUTE ONLY): 23 min  Charges:  $Therapeutic Exercise: 8-22 mins $Therapeutic Activity: 8-22 mins           Shelton Silvas PT, DPT   Shelton Silvas 07/09/2018, 2:01 PM

## 2018-07-09 NOTE — Progress Notes (Signed)
Patient ID: Nancy Zhang, female   DOB: 09-26-1925, 85 y.o.   MRN: [id number]  [employer] PROGRESS NOTE  Nancy Zhang PPI:[id number] DOB: 1925-11-09 DOA: 07/07/2018 PCP: [employer]  HPI/Subjective: Patient feels okay.  At the time when I saw her this morning she stated that she did not have any hip pain.  Objective: Vitals:   07/09/18 0527 07/09/18 1454  BP: (!) 141/53 (!) 107/48  Pulse: 97 87  Resp: 20 15  Temp: 98.8 F (37.1 C) 99 F (37.2 C)  SpO2: 91% 91%    Filed Weights   07/07/18 1909  Weight: 72.6 kg    ROS: Review of Systems  Constitutional: Negative for chills and fever.  Eyes: Negative for blurred vision.  Respiratory: Negative for cough and shortness of breath.   Cardiovascular: Negative for chest pain.  Gastrointestinal: Negative for abdominal pain, constipation, diarrhea, nausea and vomiting.  Genitourinary: Negative for dysuria.  Musculoskeletal: Positive for joint pain.  Neurological: Negative for dizziness and headaches.   Exam: Physical Exam  HENT:  Nose: No mucosal edema.  Mouth/Throat: No oropharyngeal exudate or posterior oropharyngeal edema.  Eyes: Pupils are equal, round, and reactive to light. Conjunctivae, EOM and lids are normal.  Neck: No JVD present. Carotid bruit is not present. No edema present. No thyroid mass and no thyromegaly present.  Cardiovascular: S1 normal and S2 normal. Exam reveals no gallop.  No murmur heard. Pulses:      Dorsalis pedis pulses are 2+ on the right side and 2+ on the left side.  Respiratory: No respiratory distress. She has no wheezes. She has no rhonchi. She has no rales.  GI: Soft. Bowel sounds are normal. There is no abdominal tenderness.  Musculoskeletal:     Right ankle: She exhibits no swelling.     Left ankle: She exhibits no swelling.  Lymphadenopathy:    She has no cervical adenopathy.  Neurological: She is alert. No cranial nerve deficit.  Skin: Skin is warm. No rash  noted. Nails show no clubbing.  Psychiatric: She has a normal mood and affect.      Data Reviewed: Basic Metabolic Panel: Recent Labs  Lab 07/07/18 1922 07/08/18 0420 07/09/18 0603  NA 140 142 137  K 3.8 4.5 3.9  CL 101 105 104  CO2 26 26 24   GLUCOSE 231* 244* 199*  BUN 27* 30* 27*  CREATININE 1.08* 1.29* 1.26*  CALCIUM 9.5 9.1 8.2*   Liver Function Tests: Recent Labs  Lab 07/07/18 1922  AST 17  ALT 12  ALKPHOS 75  BILITOT 0.4  PROT 7.4  ALBUMIN 4.0   CBC: Recent Labs  Lab 07/07/18 1922 07/08/18 0420 07/09/18 0603  WBC 9.2 15.9* 13.5*  NEUTROABS 6.3  --   --   HGB 12.3 11.3* 9.1*  HCT 38.4 35.1* 28.6*  MCV 88.7 87.1 87.7  PLT 262 256 194    CBG: Recent Labs  Lab 07/08/18 1342 07/08/18 1702 07/08/18 2056 07/09/18 0728 07/09/18 1204  GLUCAP 123* 152* 139* 188* 160*    Recent Results (from the past 240 hour(s))  Surgical PCR screen     Status: Abnormal   Collection Time: 07/08/18  5:15 AM  Result Value Ref Range Status   MRSA, PCR NEGATIVE NEGATIVE Final   Staphylococcus aureus POSITIVE (A) NEGATIVE Final    Comment: (NOTE) The Xpert SA Assay (FDA approved for NASAL specimens in patients 9 years of age and older), is one component of a comprehensive surveillance program.  It is not intended to diagnose infection nor to guide or monitor treatment. Performed at [facility], [address], [place], [place] [address]   Urine Culture     Status: Abnormal (Preliminary result)   Collection Time: 07/08/18 12:33 PM  Result Value Ref Range Status   Specimen Description   Final    URINE, CLEAN CATCH Performed at [facility], [address] [place], [place] [address]    Special Requests   Final    Normal Performed at [facility], [address] [place], [place] [address]    Culture >=100,000 COLONIES/mL GRAM NEGATIVE RODS (A)  Final   Report Status PENDING  Incomplete     Studies: Dg Chest 1 View  Result  Date: 07/07/2018 CLINICAL DATA:  LEFT hip fracture.  Preoperative assessment. EXAM: CHEST  1 VIEW COMPARISON:  None. FINDINGS: The heart size and mediastinal contours are within normal limits. Both lungs are clear. The visualized skeletal structures are unremarkable. IMPRESSION: No active disease. Electronically Signed   By: Staci Righter M.D.   On: 07/07/2018 19:55   Dg Hip Operative Unilat W Or W/o Pelvis Left  Result Date: 07/08/2018 CLINICAL DATA:  Hip surgery EXAM: OPERATIVE left  HIP (WITH PELVIS IF PERFORMED) 17 VIEWS TECHNIQUE: Fluoroscopic spot image(s) were submitted for interpretation post-operatively. COMPARISON:  07/07/2018 FINDINGS: Seventeen low resolution intraoperative spot views of what is presumed to represent left hip. The images demonstrate comminuted intertrochanteric fracture. Subsequent intramedullary rod and screw fixation. Total fluoroscopy time was 1 minutes 44 seconds IMPRESSION: Intraoperative fluoroscopic assistance provided during surgical fixation of hip fracture Electronically Signed   By: Donavan Foil M.D.   On: 07/08/2018 14:36   Dg Hip Unilat W Or Wo Pelvis 2-3 Views Left  Result Date: 07/07/2018 CLINICAL DATA:  [place]. Pain. EXAM: DG HIP (WITH OR WITHOUT PELVIS) 2-3V LEFT COMPARISON:  None. FINDINGS: Intertrochanteric fracture LEFT hip, avulsion lesser trochanter. Roughly 1 cm distraction of the fracture fragments. Soft tissue swelling. Vascular calcification. No pelvic fracture. IMPRESSION: Intertrochanteric fracture LEFT hip. Electronically Signed   By: Staci Righter M.D.   On: 07/07/2018 19:53    Scheduled Meds: . amLODipine  5 mg Oral Daily  . atorvastatin  10 mg Oral q1800  . docusate sodium  100 mg Oral BID  . enoxaparin (LOVENOX) injection  30 mg Subcutaneous Q24H  . insulin aspart  0-5 Units Subcutaneous QHS  . insulin aspart  0-9 Units Subcutaneous TID WC  . metFORMIN  500 mg Oral BID WC  . quinapril  40 mg Oral Daily   Continuous Infusions: .  methocarbamol (ROBAXIN) IV      Assessment/Plan:  1. Left hip intertrochanteric fracture requiring operative repair.  Pain control and DVT prophylaxis.  Most likely will need rehab.  COVID-19 testing ordered prior to going to rehab. 2. Essential hypertension on Norvasc and quinapril 3. Hyperlipidemia unspecified on atorvastatin 4. Type 2 diabetes mellitus on metformin and sliding scale 5. Acute cystitis.  Patient received preop antibiotics will give p.o. Keflex.  Code Status:     Code Status Orders  (From admission, onward)         Start     Ordered   07/07/18 2354  Full code  Continuous     07/07/18 2353        Code Status History    This patient has a current code status but no historical code status.     Family Communication: Tried to reach daughter Mariann Laster  but that phone number in the computer did not go through. Disposition Plan: Likely out to rehab  Consultants:  Orthopedic surgery  Procedures:  Hip nailing of left femur  Antibiotics:  P.o. Keflex  Time spent: 28 minutes  [place]

## 2018-07-10 ENCOUNTER — Inpatient Hospital Stay: Payer: Medicare Other

## 2018-07-10 LAB — BASIC METABOLIC PANEL
Anion gap: 8 (ref 5–15)
BUN: 29 mg/dL — ABNORMAL HIGH (ref 8–23)
CO2: 23 mmol/L (ref 22–32)
Calcium: 7.8 mg/dL — ABNORMAL LOW (ref 8.9–10.3)
Chloride: 104 mmol/L (ref 98–111)
Creatinine, Ser: 1.3 mg/dL — ABNORMAL HIGH (ref 0.44–1.00)
GFR calc Af Amer: 42 mL/min — ABNORMAL LOW (ref >60)
GFR calc non Af Amer: 36 mL/min — ABNORMAL LOW (ref >60)
Glucose, Bld: 140 mg/dL — ABNORMAL HIGH (ref 70–99)
Potassium: 4.1 mmol/L (ref 3.5–5.1)
Sodium: 135 mmol/L (ref 135–145)

## 2018-07-10 LAB — URINE CULTURE
Culture: >=100000 — AB
Special Requests: NORMAL

## 2018-07-10 LAB — CBC
HCT: 27.2 % — ABNORMAL LOW (ref 36.0–46.0)
Hemoglobin: 8.7 g/dL — ABNORMAL LOW (ref 12.0–15.0)
MCH: 28.2 pg (ref 26.0–34.0)
MCHC: 32 g/dL (ref 30.0–36.0)
MCV: 88 fL (ref 80.0–100.0)
Platelets: 189 10*3/uL (ref 150–400)
RBC: 3.09 MIL/uL — ABNORMAL LOW (ref 3.87–5.11)
RDW: 13.2 % (ref 11.5–15.5)
WBC: 14.5 10*3/uL — ABNORMAL HIGH (ref 4.0–10.5)
nRBC: 0 % (ref 0.0–0.2)

## 2018-07-10 LAB — GLUCOSE, CAPILLARY
Glucose-Capillary: 137 mg/dL — ABNORMAL HIGH (ref 70–99)
Glucose-Capillary: 142 mg/dL — ABNORMAL HIGH (ref 70–99)
Glucose-Capillary: 124 mg/dL — ABNORMAL HIGH (ref 70–99)
Glucose-Capillary: 114 mg/dL — ABNORMAL HIGH (ref 70–99)

## 2018-07-10 MED ORDER — OXYCODONE HCL 5 MG PO TABS: 2.5000 mg | ORAL_TABLET | ORAL | 0 refills | Status: DC | PRN

## 2018-07-10 MED ORDER — TRAMADOL HCL 50 MG PO TABS: 50.0000 mg | ORAL_TABLET | Freq: Four times a day (QID) | ORAL | 1 refills | Status: DC | PRN

## 2018-07-10 MED ORDER — ENOXAPARIN SODIUM 40 MG/0.4ML ~~LOC~~ SOLN: 40.0000 mg | SUBCUTANEOUS | 0 refills | Status: DC

## 2018-07-10 MED ORDER — QUINAPRIL HCL 10 MG PO TABS
10.0000 mg | ORAL_TABLET | Freq: Every day | ORAL | Status: DC
  Administered 2018-07-11: 10 mg via ORAL
  Filled 2018-07-10: qty 1

## 2018-07-10 MED ORDER — AMLODIPINE BESYLATE 5 MG PO TABS
2.5000 mg | ORAL_TABLET | Freq: Every day | ORAL | Status: DC
  Administered 2018-07-11: 09:00:00 2.5 mg via ORAL
  Filled 2018-07-10: qty 1

## 2018-07-10 NOTE — Progress Notes (Signed)
  Subjective: 2 Days Post-Op Procedure(s) (LRB): INTRAMEDULLARY (IM) NAIL INTERTROCHANTRIC (Left) Patient reports pain as mild.   Patient seen in rounds with Dr. Roland Rack. Patient is well, and has had no acute complaints or problems Plan is to go Rehab after hospital stay. Negative for chest pain and shortness of breath Fever: no Gastrointestinal: Negative for nausea and vomiting  Objective: Vital signs in last 24 hours: Temp:  [99 F (37.2 C)] 99 F (37.2 C) (04/26 0258) Pulse Rate:  [77-93] 93 (04/26 0258) Resp:  [15-19] 17 (04/26 0258) BP: (106-144)/(45-60) 144/60 (04/26 0258) SpO2:  [91 %-92 %] 92 % (04/26 0258)  Intake/Output from previous day:  Intake/Output Summary (Last 24 hours) at 07/10/2018 0714 Last data filed at 07/10/2018 0546 Gross per 24 hour  Intake 360 ml  Output 350 ml  Net 10 ml    Intake/Output this shift: No intake/output data recorded.  Labs: Recent Labs    07/07/18 1922 07/08/18 0420 07/09/18 0603 07/10/18 0440  HGB 12.3 11.3* 9.1* 8.7*   Recent Labs    07/09/18 0603 07/10/18 0440  WBC 13.5* 14.5*  RBC 3.26* 3.09*  HCT 28.6* 27.2*  PLT 194 189   Recent Labs    07/09/18 0603 07/10/18 0440  NA 137 135  K 3.9 4.1  CL 104 104  CO2 24 23  BUN 27* 29*  CREATININE 1.26* 1.30*  GLUCOSE 199* 140*  CALCIUM 8.2* 7.8*   Recent Labs    07/07/18 2016  INR 1.0     EXAM General - Patient is Alert and Oriented Extremity - Sensation intact distally Dorsiflexion/Plantar flexion intact Compartment soft Dressing/Incision - clean, dry, no drainage Motor Function - intact, moving foot and toes well on exam.   Past Medical History:  Diagnosis Date  . Breast cancer ([place]) 2010   RT LUMPECTOMY  . Diabetes ([place])   . Diverticulosis   . Hypercholesterolemia   . Hypertension   . Intention tremor   . Personal history of chemotherapy 2010   BREAST CA  . Personal history of radiation therapy 2010   BREAST CA    Assessment/Plan: 2 Days  Post-Op Procedure(s) (LRB): INTRAMEDULLARY (IM) NAIL INTERTROCHANTRIC (Left) Active Problems:   Closed left hip fracture (HCC)  Estimated body mass index is 31.25 kg/m as calculated from the following:   Height as of this encounter: 5' (1.524 m).   Weight as of this encounter: 72.6 kg. Advance diet Up with therapy D/C IV fluids Discharge to SNF when cleared by medicine. Follow-up at [facility] clinic orthopedics in 2 weeks  DVT Prophylaxis - Lovenox, Foot Pumps and TED hose Weight-Bearing as tolerated to left leg  Reche Dixon, PA-C Orthopaedic Surgery 07/10/2018, 7:14 AM

## 2018-07-10 NOTE — Progress Notes (Signed)
Physical Therapy Treatment Patient Details Name: Nancy Zhang MRN: [id number] DOB: [birth year] Today's Date: 07/10/2018    History of Present Illness Nancy Zhang is a 85 y.o. female who sustained an intertrochanteric fracture after a fall walking to refrigerator. Intramedullary nail placement 07/08/18 Dr Rudene Christians. PMH of DM, HTN, diverticulosis.     PT Comments    Patient L sidelying in bed resting on PT arrival and reports "I've been looking for you." Patient performed bed mobility with Mod I and minimal cueing with appropriate and safe strategies. Patient amenable to perform bed exercises but reports too fatigued to get up at present. Patient performed BLE exercises listed below and continued to report increased fatigue. Patient's SpO2 was assessed to be 86% on room air. Patient was provided with rest time and encouraged to participate in pursed lip breathing. Patient's SpO2 was reassessed after 3 minutes and was at 87%. PT discontinued session due to patient's fatigue and SpO2 levels. PT communicated with RN regarding O2 needs and will f/u this afternoon as tolerated by patient. Patient will continue to benefit from skilled therapeutic intervention to address deficits in strength, mobility, balance, and safety for improved overall QOL.    Follow Up Recommendations  SNF     Equipment Recommendations  Standard walker;3in1 (PT)    Recommendations for Other Services Rehab consult     Precautions / Restrictions Precautions Precautions: Fall Restrictions Weight Bearing Restrictions: Yes LLE Weight Bearing: Weight bearing as tolerated    Mobility  Bed Mobility Overal bed mobility: Modified Independent Bed Mobility: Rolling Rolling: Modified independent (Device/Increase time)         General bed mobility comments: Patient able to transfers s/l to supine for bed exercises with min cueing and limited use of bed rails.   Transfers                 General transfer comment:  Unable to perform 2/2 to SpO2 drop to 86% with supine ther ex.   Ambulation/Gait             General Gait Details: Not able to ambulate to date   Stairs             Wheelchair Mobility    Modified Rankin (Stroke Patients Only)       Balance                                            Cognition Arousal/Alertness: Awake/alert Behavior During Therapy: WFL for tasks assessed/performed Overall Cognitive Status: Within Functional Limits for tasks assessed                                 General Comments: alert and oriented, follows all commands      Exercises General Exercises - Lower Extremity Ankle Circles/Pumps: AROM;10 reps Quad Sets: 5 reps Heel Slides: 5 reps;AROM Straight Leg Raises: AAROM;Supine;5 reps    General Comments        Pertinent Vitals/Pain Pain Assessment: Faces Faces Pain Scale: Hurts a little bit Pain Location: L hip during transfer, subsides following Pain Intervention(s): Limited activity within patient's tolerance;Repositioned;Monitored during session    Home Living                      Prior Function  PT Goals (current goals can now be found in the care plan section) Acute Rehab PT Goals Patient Stated Goal: Return home independently  PT Goal Formulation: With patient Time For Goal Achievement: 07/23/18 Potential to Achieve Goals: Fair    Frequency    BID      PT Plan Current plan remains appropriate    Co-evaluation              AM-PAC PT "6 Clicks" Mobility   Outcome Measure  Help needed turning from your back to your side while in a flat bed without using bedrails?: A Little Help needed moving from lying on your back to sitting on the side of a flat bed without using bedrails?: A Lot Help needed moving to and from a bed to a chair (including a wheelchair)?: A Lot Help needed standing up from a chair using your arms (e.g., wheelchair or bedside chair)?:  A Lot Help needed to walk in hospital room?: Total Help needed climbing 3-5 steps with a railing? : Total 6 Click Score: 11    End of Session   Activity Tolerance: Treatment limited secondary to medical complications (Comment)(SpO2 dropped to 86% and unable to recover above 87% on room air.) Patient left: with call bell/phone within reach;with SCD's reapplied;with bed alarm set;in bed Nurse Communication: Other (comment)(O2 needs) PT Visit Diagnosis: Unsteadiness on feet (R26.81);Other abnormalities of gait and mobility (R26.89);History of falling (Z91.81);Muscle weakness (generalized) (M62.81);Difficulty in walking, not elsewhere classified (R26.2);Pain Pain - Right/Left: Left Pain - part of body: Hip     Time: 6067-7034 PT Time Calculation (min) (ACUTE ONLY): 13 min  Charges:  $Therapeutic Exercise: 8-22 mins                    Myles Gip PT, DPT [phone] 07/10/2018, 11:14 AM

## 2018-07-10 NOTE — Progress Notes (Signed)
Pt alert but does have some periods of confusion. No complaints of pain during the night. Pt able to sleep in between care.

## 2018-07-10 NOTE — Progress Notes (Signed)
PT Cancellation Note  Patient Details Name: SHANETTA NICOLLS MRN: [id number] DOB: [birth year]   Cancelled Treatment:    Reason Eval/Treat Not Completed: Other (comment) Patient sleeping in bed on PT arrival. Patient requested to eat breakfast prior to PT. PT will follow-up at a later time. Chart reviewed.  Myles Gip PT, DPT [phone]  07/10/2018, 9:04 AM

## 2018-07-10 NOTE — Progress Notes (Signed)
Patient ID: Nancy Zhang, female   DOB: [birth year], 85 y.o.   MRN: [id number]  [facility] PROGRESS NOTE  DAILYNN NANCARROW RCB:[id number] DOB: 1925/11/02 DOA: 07/07/2018 PCP: [employer] [employer]  HPI/Subjective: Patient feels okay.  Does not really complain of any pain.  Nurse did give a dose of tramadol. Objective: Vitals:   07/09/18 1953 07/10/18 0258  BP: (!) 106/45 (!) 144/60  Pulse: 77 93  Resp: 19 17  Temp: 99 F (37.2 C) 99 F (37.2 C)  SpO2: 92% 92%    Filed Weights   07/07/18 1909  Weight: 72.6 kg    ROS: Review of Systems  Constitutional: Negative for chills and fever.  Eyes: Negative for blurred vision.  Respiratory: Negative for cough and shortness of breath.   Cardiovascular: Negative for chest pain.  Gastrointestinal: Negative for abdominal pain, constipation, diarrhea, nausea and vomiting.  Genitourinary: Negative for dysuria.  Musculoskeletal: Negative for joint pain.  Neurological: Negative for dizziness and headaches.   Exam: Physical Exam  HENT:  Nose: No mucosal edema.  Mouth/Throat: No oropharyngeal exudate or posterior oropharyngeal edema.  Eyes: Pupils are equal, round, and reactive to light. Conjunctivae, EOM and lids are normal.  Neck: No JVD present. Carotid bruit is not present. No edema present. No thyroid mass and no thyromegaly present.  Cardiovascular: S1 normal and S2 normal. Exam reveals no gallop.  No murmur heard. Pulses:      Dorsalis pedis pulses are 2+ on the right side and 2+ on the left side.  Respiratory: No respiratory distress. She has no wheezes. She has no rhonchi. She has no rales.  GI: Soft. Bowel sounds are normal. There is no abdominal tenderness.  Musculoskeletal:     Right ankle: She exhibits no swelling.     Left ankle: She exhibits no swelling.  Lymphadenopathy:    She has no cervical adenopathy.  Neurological: She is alert. No cranial nerve deficit.  Skin: Skin is warm. No rash noted. Nails show  no clubbing.  Psychiatric: She has a normal mood and affect.      Data Reviewed: Basic Metabolic Panel: Recent Labs  Lab 07/07/18 1922 07/08/18 0420 07/09/18 0603 07/10/18 0440  NA 140 142 137 135  K 3.8 4.5 3.9 4.1  CL 101 105 104 104  CO2 26 26 24 23   GLUCOSE 231* 244* 199* 140*  BUN 27* 30* 27* 29*  CREATININE 1.08* 1.29* 1.26* 1.30*  CALCIUM 9.5 9.1 8.2* 7.8*   Liver Function Tests: Recent Labs  Lab 07/07/18 1922  AST 17  ALT 12  ALKPHOS 75  BILITOT 0.4  PROT 7.4  ALBUMIN 4.0   CBC: Recent Labs  Lab 07/07/18 1922 07/08/18 0420 07/09/18 0603 07/10/18 0440  WBC 9.2 15.9* 13.5* 14.5*  NEUTROABS 6.3  --   --   --   HGB 12.3 11.3* 9.1* 8.7*  HCT 38.4 35.1* 28.6* 27.2*  MCV 88.7 87.1 87.7 88.0  PLT 262 256 194 189    CBG: Recent Labs  Lab 07/09/18 1204 07/09/18 1652 07/09/18 2104 07/10/18 0731 07/10/18 1130  GLUCAP 160* 130* 125* 137* 142*    Recent Results (from the past 240 hour(s))  Surgical PCR screen     Status: Abnormal   Collection Time: 07/08/18  5:15 AM  Result Value Ref Range Status   MRSA, PCR NEGATIVE NEGATIVE Final   Staphylococcus aureus POSITIVE (A) NEGATIVE Final    Comment: (NOTE) The Xpert SA Assay (FDA approved for NASAL specimens in  patients 20 years of age and older), is one component of a comprehensive surveillance program. It is not intended to diagnose infection nor to guide or monitor treatment. Performed at [facility], [address] [place], [place] [address]   Urine Culture     Status: Abnormal   Collection Time: 07/08/18 12:33 PM  Result Value Ref Range Status   Specimen Description   Final    URINE, CLEAN CATCH Performed at [facility], [address] [place], [place] [address]    Special Requests   Final    Normal Performed at [facility], [place], [place] [address]    Culture >=100,000 COLONIES/mL ESCHERICHIA COLI (A)  Final   Report Status 07/10/2018  FINAL  Final   Organism ID, Bacteria ESCHERICHIA COLI (A)  Final      Susceptibility   Escherichia coli - MIC*    AMPICILLIN <=2 SENSITIVE Sensitive     CEFAZOLIN <=4 SENSITIVE Sensitive     CEFTRIAXONE <=1 SENSITIVE Sensitive     CIPROFLOXACIN <=0.25 SENSITIVE Sensitive     GENTAMICIN <=1 SENSITIVE Sensitive     IMIPENEM <=0.25 SENSITIVE Sensitive     NITROFURANTOIN <=16 SENSITIVE Sensitive     TRIMETH/SULFA <=20 SENSITIVE Sensitive     AMPICILLIN/SULBACTAM <=2 SENSITIVE Sensitive     PIP/TAZO <=4 SENSITIVE Sensitive     Extended ESBL NEGATIVE Sensitive     * >=100,000 COLONIES/mL ESCHERICHIA COLI  SARS Coronavirus 2 [facility] order, Performed in [place] hospital lab)     Status: None   Collection Time: 07/09/18  3:36 PM  Result Value Ref Range Status   SARS Coronavirus 2 NEGATIVE NEGATIVE Final    Comment: Performed at [facility] [place], [address] [place], [place] [address]     Scheduled Meds: . amLODipine  5 mg Oral Daily  . atorvastatin  10 mg Oral q1800  . cephALEXin  250 mg Oral TID  . docusate sodium  100 mg Oral BID  . enoxaparin (LOVENOX) injection  30 mg Subcutaneous Q24H  . insulin aspart  0-5 Units Subcutaneous QHS  . insulin aspart  0-9 Units Subcutaneous TID WC  . metFORMIN  500 mg Oral BID WC  . quinapril  40 mg Oral Daily   Continuous Infusions: . methocarbamol (ROBAXIN) IV      Assessment/Plan:  1. Left hip intertrochanteric fracture requiring operative repair.  Pain control and DVT prophylaxis.  Potential disposition to rehab tomorrow.  COVID-19 testing is negative. 2. Essential hypertension on Norvasc and quinapril.  We will drop dosing down since blood pressure on the lower side 3. Hyperlipidemia unspecified on atorvastatin 4. Type 2 diabetes mellitus on metformin and sliding scale 5. Acute cystitis.  Patient received preop antibiotics will give p.o. Keflex. 6. Low-grade temperature.  Unsure if this is from postoperative or acute  cystitis.  Chest x-ray ordered to rule out pneumonia.  Looks okay to me but radiologist has not read it yet.  Code Status:     Code Status Orders  (From admission, onward)         Start     Ordered   07/07/18 2354  Full code  Continuous     07/07/18 2353        Code Status History    This patient has a current code status but no historical code status.     Family Communication: Spoke with daughter Chong Sicilian on the phone at [phone].  Patient's other daughter Legrand Rams called  into the room while I was there and I spoke with her also. Disposition Plan: Likely out to rehab tomorrow if afebrile  Consultants:  Orthopedic surgery  Procedures:  Hip nailing of left femur  Antibiotics:  P.o. Keflex  Time spent: 28 minutes  [place]

## 2018-07-10 NOTE — Progress Notes (Signed)
Physical Therapy Treatment Patient Details Name: Nancy Zhang MRN: [id number] DOB: May 09, 1925 Today's Date: 07/10/2018    History of Present Illness Nancy Zhang is a 85 y.o. female who sustained an intertrochanteric fracture after a fall walking to refrigerator. Intramedullary nail placement 07/08/18 Dr Rudene Christians. PMH of DM, HTN, diverticulosis.     PT Comments    Patient supine in bed on PT arrival and finishing lunch. Patient requested to work on bed mobility and positioning; continues to require Mod A for bed mobility and movement of surgical limb. Patient has some noted fasciculations in LLE during exercise likely due to fatigue and lack of motor control s/p trauma. Patient continues to tolerate bed exercises well, but is limited in bed mobility and transfers due to pain and fatigue. Patient will continue to benefit from skilled therapeutic intervention to address deficits in strength, mobility, balance, and safety.   Patient in bed, SCDs applied, call bell in reach, and bed alarm set. All needs met.    Follow Up Recommendations  SNF     Equipment Recommendations  Standard walker;3in1 (PT)    Recommendations for Other Services Rehab consult     Precautions / Restrictions Precautions Precautions: Fall Restrictions Weight Bearing Restrictions: Yes LLE Weight Bearing: Weight bearing as tolerated    Mobility  Bed Mobility Overal bed mobility: Needs Assistance Bed Mobility: Supine to Sit;Sit to Supine Rolling: Modified independent (Device/Increase time)   Supine to sit: Mod assist;HOB elevated Sit to supine: Mod assist;HOB elevated   General bed mobility comments: Patient required Mod A for movement of LLE and ultimately d/c transfers to EOB 2/2 to increased pain and fatigue.  Transfers                 General transfer comment: Unable to perform 2/2 to SpO2 drop to 86% with supine ther ex.   Ambulation/Gait             General Gait Details: Not able to  ambulate to date   Stairs             Wheelchair Mobility    Modified Rankin (Stroke Patients Only)       Balance                                            Cognition Arousal/Alertness: Awake/alert Behavior During Therapy: WFL for tasks assessed/performed Overall Cognitive Status: Within Functional Limits for tasks assessed                                 General Comments: alert and oriented, follows all commands      Exercises General Exercises - Lower Extremity Ankle Circles/Pumps: AROM;10 reps Quad Sets: 10 reps Heel Slides: 5 reps;AROM Hip ABduction/ADduction: AROM;5 reps Straight Leg Raises: AAROM;Supine;5 reps    General Comments        Pertinent Vitals/Pain Pain Assessment: Faces Faces Pain Scale: Hurts little more Pain Location: L hip during transfer, subsides following Pain Intervention(s): Limited activity within patient's tolerance;Repositioned;Monitored during session    Home Living                      Prior Function            PT Goals (current goals can now be found in the care plan section)  Acute Rehab PT Goals Patient Stated Goal: Return home independently  PT Goal Formulation: With patient Time For Goal Achievement: 07/23/18 Potential to Achieve Goals: Fair    Frequency    BID      PT Plan Current plan remains appropriate    Co-evaluation              AM-PAC PT "6 Clicks" Mobility   Outcome Measure  Help needed turning from your back to your side while in a flat bed without using bedrails?: A Little Help needed moving from lying on your back to sitting on the side of a flat bed without using bedrails?: A Lot Help needed moving to and from a bed to a chair (including a wheelchair)?: A Lot Help needed standing up from a chair using your arms (e.g., wheelchair or bedside chair)?: A Lot Help needed to walk in hospital room?: Total Help needed climbing 3-5 steps with a  railing? : Total 6 Click Score: 11    End of Session   Activity Tolerance: Patient limited by pain;Patient limited by fatigue(SpO2 dropped to 86% and unable to recover above 87% on room air.) Patient left: with call bell/phone within reach;with SCD's reapplied;with bed alarm set;in bed Nurse Communication: (O2 needs) PT Visit Diagnosis: Unsteadiness on feet (R26.81);Other abnormalities of gait and mobility (R26.89);History of falling (Z91.81);Muscle weakness (generalized) (M62.81);Difficulty in walking, not elsewhere classified (R26.2);Pain Pain - Right/Left: Left Pain - part of body: Hip     Time: 3817-7116 PT Time Calculation (min) (ACUTE ONLY): 21 min  Charges:  $Therapeutic Exercise: 8-22 mins           Myles Gip PT, DPT [phone]          07/10/2018, 3:04 PM

## 2018-07-10 NOTE — Plan of Care (Signed)

## 2018-07-10 NOTE — Progress Notes (Signed)
OT Cancellation Note  Patient Details Name: Nancy Zhang MRN: [id number] DOB: 1925-09-27   Cancelled Treatment:    Reason Eval/Treat Not Completed: Fatigue/lethargy limiting ability to participate. Pt sleeping soundly, does not wake up verbal cues. Pt noted to have recently worked with PT and indicating fatigue during that session. Will re-attempt at later date/time as pt is able to participate.   Jeni Salles, MPH, MS, OTR/L ascom [phone] 07/10/18, 11:25 AM

## 2018-07-11 ENCOUNTER — Encounter: Payer: Self-pay | Admitting: Orthopedic Surgery

## 2018-07-11 LAB — BASIC METABOLIC PANEL
Anion gap: 9 (ref 5–15)
BUN: 32 mg/dL — ABNORMAL HIGH (ref 8–23)
CO2: 25 mmol/L (ref 22–32)
Calcium: 7.7 mg/dL — ABNORMAL LOW (ref 8.9–10.3)
Chloride: 101 mmol/L (ref 98–111)
Creatinine, Ser: 1.03 mg/dL — ABNORMAL HIGH (ref 0.44–1.00)
GFR calc Af Amer: 55 mL/min — ABNORMAL LOW (ref >60)
GFR calc non Af Amer: 48 mL/min — ABNORMAL LOW (ref >60)
Glucose, Bld: 124 mg/dL — ABNORMAL HIGH (ref 70–99)
Potassium: 4 mmol/L (ref 3.5–5.1)
Sodium: 135 mmol/L (ref 135–145)

## 2018-07-11 LAB — CBC
HCT: 25.5 % — ABNORMAL LOW (ref 36.0–46.0)
Hemoglobin: 8.1 g/dL — ABNORMAL LOW (ref 12.0–15.0)
MCH: 28 pg (ref 26.0–34.0)
MCHC: 31.8 g/dL (ref 30.0–36.0)
MCV: 88.2 fL (ref 80.0–100.0)
Platelets: 223 10*3/uL (ref 150–400)
RBC: 2.89 MIL/uL — ABNORMAL LOW (ref 3.87–5.11)
RDW: 13.2 % (ref 11.5–15.5)
WBC: 11.7 10*3/uL — ABNORMAL HIGH (ref 4.0–10.5)
nRBC: 0 % (ref 0.0–0.2)

## 2018-07-11 LAB — GLUCOSE, CAPILLARY: Glucose-Capillary: 125 mg/dL — ABNORMAL HIGH (ref 70–99)

## 2018-07-11 MED ORDER — QUINAPRIL HCL 10 MG PO TABS: 10.0000 mg | ORAL_TABLET | Freq: Every day | ORAL | 0 refills | Status: DC

## 2018-07-11 MED ORDER — DOCUSATE SODIUM 100 MG PO CAPS: 100.0000 mg | ORAL_CAPSULE | Freq: Two times a day (BID) | ORAL | 0 refills | Status: DC

## 2018-07-11 MED ORDER — ACETAMINOPHEN 325 MG PO TABS: 650.0000 mg | ORAL_TABLET | Freq: Four times a day (QID) | ORAL | 0 refills | Status: DC | PRN

## 2018-07-11 MED ORDER — AMLODIPINE BESYLATE 2.5 MG PO TABS: 2.5000 mg | ORAL_TABLET | Freq: Every day | ORAL | 0 refills | Status: DC

## 2018-07-11 MED ORDER — ACETAMINOPHEN 325 MG PO TABS: 650.0000 mg | ORAL_TABLET | Freq: Four times a day (QID) | ORAL | Status: DC | PRN

## 2018-07-11 MED ORDER — CEPHALEXIN 250 MG PO CAPS: 250.0000 mg | ORAL_CAPSULE | Freq: Three times a day (TID) | ORAL | 0 refills | Status: AC

## 2018-07-11 NOTE — Progress Notes (Signed)
Called report to [facility], answered all [place] questions. EMS called

## 2018-07-11 NOTE — TOC Transition Note (Signed)
Transition of Care [facility]) - CM/SW Discharge Note   Patient Details  Name: Nancy Zhang MRN: [id number] Date of Birth: [birth year]  Transition of Care [facility]) CM/SW Contact:  Su Hilt, RN Phone Number: 07/11/2018, 9:50 AM   Clinical Narrative:    Patient will DC to [facility] room 31 A via EMS The bed side nurse to call report to [phone] EMS to be called by the bedside nurse for transport DC packet on the chart [name] was notified of the DC At [phone]   Final next level of care: Skilled Nursing Facility([facility]) Barriers to Discharge: Barriers Resolved   Patient Goals and CMS Choice Patient states their goals for this hospitalization and ongoing recovery are:: get well CMS Medicare.gov Compare Post Acute Care list provided to:: Patient Represenative (must comment) Choice offered to / list presented to : Adult Children  Discharge Placement              Patient chooses bed at: [facility] Patient to be transferred to facility by: EMS Name of family member notified: [name] Patient and family notified of of transfer: 07/11/18  Discharge Plan and Services                                     Social Determinants of Health (SDOH) Interventions     Readmission Risk Interventions No flowsheet data found.

## 2018-07-11 NOTE — Progress Notes (Signed)
  Subjective: 3 Days Post-Op Procedure(s) (LRB): INTRAMEDULLARY (IM) NAIL INTERTROCHANTRIC (Left) Patient reports pain as mild.   Patient seen in rounds with Dr. Posey Pronto. Patient is well, and has had no acute complaints or problems Plan is to go Rehab after hospital stay. Negative for chest pain and shortness of breath Fever: no Gastrointestinal: Negative for nausea and vomiting  Objective: Vital signs in last 24 hours: Temp:  [98 F (36.7 C)-98.6 F (37 C)] 98.4 F (36.9 C) (04/27 0441) Pulse Rate:  [80-81] 81 (04/27 0441) Resp:  [17-20] 19 (04/27 0441) BP: (104-133)/(38-51) 133/48 (04/27 0441) SpO2:  [91 %-98 %] 98 % (04/27 0441)  Intake/Output from previous day:  Intake/Output Summary (Last 24 hours) at 07/11/2018 0705 Last data filed at 07/10/2018 2030 Gross per 24 hour  Intake -  Output 250 ml  Net -250 ml    Intake/Output this shift: No intake/output data recorded.  Labs: Recent Labs    07/09/18 0603 07/10/18 0440 07/11/18 0458  HGB 9.1* 8.7* 8.1*   Recent Labs    07/10/18 0440 07/11/18 0458  WBC 14.5* 11.7*  RBC 3.09* 2.89*  HCT 27.2* 25.5*  PLT 189 223   Recent Labs    07/10/18 0440 07/11/18 0458  NA 135 135  K 4.1 4.0  CL 104 101  CO2 23 25  BUN 29* 32*  CREATININE 1.30* 1.03*  GLUCOSE 140* 124*  CALCIUM 7.8* 7.7*   No results for input(s): LABPT, INR in the last 72 hours.   EXAM General - Patient is Alert and Oriented Extremity - Sensation intact distally Dorsiflexion/Plantar flexion intact Compartment soft Dressing/Incision - clean, dry, minimal drainage Motor Function - intact, moving foot and toes well on exam.   Past Medical History:  Diagnosis Date  . Breast cancer ([place]) 2010   RT LUMPECTOMY  . Diabetes ([place])   . Diverticulosis   . Hypercholesterolemia   . Hypertension   . Intention tremor   . Personal history of chemotherapy 2010   BREAST CA  . Personal history of radiation therapy 2010   BREAST CA     Assessment/Plan: 3 Days Post-Op Procedure(s) (LRB): INTRAMEDULLARY (IM) NAIL INTERTROCHANTRIC (Left) Active Problems:   Closed left hip fracture (HCC)  Estimated body mass index is 31.25 kg/m as calculated from the following:   Height as of this encounter: 5' (1.524 m).   Weight as of this encounter: 72.6 kg. Advance diet Up with therapy D/C IV fluids Discharge to SNF when cleared by medicine. Follow-up at [facility] clinic orthopedics in 2 weeks  DVT Prophylaxis - Lovenox, Foot Pumps and TED hose Weight-Bearing as tolerated to left leg  Reche Dixon, PA-C Orthopaedic Surgery 07/11/2018, 7:05 AM

## 2018-07-11 NOTE — Care Management Important Message (Signed)
Important Message  Patient Details  Name: Nancy Zhang MRN: [id number] Date of Birth: June 25, 1925   Medicare Important Message Given:  Yes    [name] 07/11/2018, 11:38 AM

## 2018-07-11 NOTE — Discharge Summary (Signed)
[place] at [facility] Discharge summary  PATIENT NAME: Nancy Zhang    MR#:  [id number]  DATE OF BIRTH:  1925/05/12  DATE OF ADMISSION:  07/07/2018 ADMITTING PHYSICIAN: Sela Hua, MD  DATE OF DISCHARGE: 07/11/2018  PRIMARY CARE PHYSICIAN: [facility], [place]    ADMISSION DIAGNOSIS:  Fall [W19.XXXA] Closed displaced intertrochanteric fracture of left femur, initial encounter ([place]) [S72.142A]  DISCHARGE DIAGNOSIS:  Active Problems:   Closed left hip fracture ([place])   SECONDARY DIAGNOSIS:   Past Medical History:  Diagnosis Date  . Breast cancer ([place]) 2010   RT LUMPECTOMY  . Diabetes ([place])   . Diverticulosis   . Hypercholesterolemia   . Hypertension   . Intention tremor   . Personal history of chemotherapy 2010   BREAST CA  . Personal history of radiation therapy 2010   BREAST CA    HOSPITAL COURSE:   1.  Left hip intertrochanteric fracture requiring operative repair.  Patient had surgery on 07/08/2018.  Please see operative report.  The quicker we can move the patient over to Tylenol for pain control the better.  The orthopedic surgeons did prescribe tramadol and low-dose oxycodone.  I would try Tylenol first.  Follow-up with orthopedic surgery.  Physical therapy as tolerated.  Orthopedic surgeons did prescribe Lovenox for DVT prophylaxis as outpatient while on Lovenox suggest checking a CBC weekly. 2.  Essential hypertension on Norvasc and quinapril.  I lowered the dose down of both these medications if blood pressure starts trending higher can go up on the Norvasc to 5 mg and up on the quinapril to 40 mg. 3.  Acute cystitis with E. coli.  I will give Keflex for 2 more days upon discharge.  Low-grade temperature postoperatively could be secondary to postop or acute cystitis.  Chest x-ray negative for pneumonia 4.  Hyperlipidemia unspecified on atorvastatin 5.  Type 2 diabetes mellitus.  Glucophage stopped secondary to creatinine  clearance.  All sugars have remained good.  Will check fingersticks on a daily basis   DISCHARGE CONDITIONS:   Satisfactory  CONSULTS OBTAINED:  Treatment Team:  Leim Fabry, MD  DRUG ALLERGIES:   Allergies  Allergen Reactions  . Shellfish Allergy Swelling    DISCHARGE MEDICATIONS:   Allergies as of 07/11/2018      Reactions   Shellfish Allergy Swelling      Medication List    STOP taking these medications   hydrochlorothiazide 25 MG tablet Commonly known as:  HYDRODIURIL   metFORMIN 500 MG 24 hr tablet Commonly known as:  GLUCOPHAGE-XR     TAKE these medications   acetaminophen 325 MG tablet Commonly known as:  TYLENOL Take 2 tablets (650 mg total) by mouth every 6 (six) hours as needed for mild pain, fever or headache.   amLODipine 2.5 MG tablet Commonly known as:  NORVASC Take 1 tablet (2.5 mg total) by mouth daily. Start taking on:  July 12, 2018 What changed:    medication strength  how much to take   atorvastatin 10 MG tablet Commonly known as:  LIPITOR Take 10 mg by mouth daily at 6 PM.   cephALEXin 250 MG capsule Commonly known as:  KEFLEX Take 1 capsule (250 mg total) by mouth 3 (three) times daily for 2 days.   docusate sodium 100 MG capsule Commonly known as:  COLACE Take 1 capsule (100 mg total) by mouth 2 (two) times daily.   enoxaparin 40 MG/0.4ML injection Commonly known as:  LOVENOX Inject  0.4 mLs (40 mg total) into the skin daily.   oxyCODONE 5 MG immediate release tablet Commonly known as:  Oxy IR/ROXICODONE Take 0.5-1 tablets (2.5-5 mg total) by mouth every 4 (four) hours as needed for moderate pain or severe pain (pain score 4-6).   quinapril 10 MG tablet Commonly known as:  ACCUPRIL Take 1 tablet (10 mg total) by mouth daily. Start taking on:  July 12, 2018 What changed:    medication strength  how much to take   traMADol 50 MG tablet Commonly known as:  ULTRAM Take 1 tablet (50 mg total) by mouth every 6 (six)  hours as needed for moderate pain.        DISCHARGE INSTRUCTIONS:   Follow-up Dr. at [place] 1 day Follow-up orthopedic surgery  If you experience worsening of your admission symptoms, develop shortness of breath, life threatening emergency, suicidal or homicidal thoughts you must seek medical attention immediately by calling 911 or calling your MD immediately  if symptoms less severe.  You Must read complete instructions/literature along with all the possible adverse reactions/side effects for all the Medicines you take and that have been prescribed to you. Take any new Medicines after you have completely understood and accept all the possible adverse reactions/side effects.   Please note  You were cared for by a hospitalist during your hospital stay. If you have any questions about your discharge medications or the care you received while you were in the hospital after you are discharged, you can call the unit and asked to speak with the hospitalist on call if the hospitalist that took care of you is not available. Once you are discharged, your primary care physician will handle any further medical issues. Please note that NO REFILLS for any discharge medications will be authorized once you are discharged, as it is imperative that you return to your primary care physician (or establish a relationship with a primary care physician if you do not have one) for your aftercare needs so that they can reassess your need for medications and monitor your lab values.    Today   CHIEF COMPLAINT:   Chief Complaint  Patient presents with  . Hip Pain  . Fall    HISTORY OF PRESENT ILLNESS:  Nancy Zhang  is a 85 y.o. female presented after a fall and found to have a hip fracture   VITAL SIGNS:  Blood pressure (!) 144/53, pulse 91, temperature 98 F (36.7 C), temperature source Oral, resp. rate 18, height 5' (1.524 m), weight 72.6 kg, SpO2 91 %.    PHYSICAL EXAMINATION:   GENERAL:  85 y.o.-year-old patient lying in the bed with no acute distress.  EYES: Pupils equal, round, reactive to light and accommodation. No scleral icterus. Extraocular muscles intact.  HEENT: Head atraumatic, normocephalic. Oropharynx and nasopharynx clear.  NECK:  Supple, no jugular venous distention. No thyroid enlargement, no tenderness.  LUNGS: Normal breath sounds bilaterally, no wheezing, rales,rhonchi or crepitation. No use of accessory muscles of respiration.  CARDIOVASCULAR: S1, S2 normal. No murmurs, rubs, or gallops.  ABDOMEN: Soft, non-tender, non-distended. Bowel sounds present. No organomegaly or mass.  EXTREMITIES: No pedal edema, cyanosis, or clubbing.  NEUROLOGIC: Cranial nerves II through XII are intact. Muscle strength 5/5 in all extremities. Sensation intact. Gait not checked.  PSYCHIATRIC: The patient is alert and oriented x 3.  SKIN: No obvious rash, lesion, or ulcer.   DATA REVIEW:   CBC Recent Labs  Lab 07/11/18 0458  WBC 11.7*  HGB 8.1*  HCT 25.5*  PLT 223    Chemistries  Recent Labs  Lab 07/07/18 1922  07/11/18 0458  NA 140   < > 135  K 3.8   < > 4.0  CL 101   < > 101  CO2 26   < > 25  GLUCOSE 231*   < > 124*  BUN 27*   < > 32*  CREATININE 1.08*   < > 1.03*  CALCIUM 9.5   < > 7.7*  AST 17  --   --   ALT 12  --   --   ALKPHOS 75  --   --   BILITOT 0.4  --   --    < > = values in this interval not displayed.     Microbiology Results  Results for orders placed or performed during the hospital encounter of 07/07/18  Surgical PCR screen     Status: Abnormal   Collection Time: 07/08/18  5:15 AM  Result Value Ref Range Status   MRSA, PCR NEGATIVE NEGATIVE Final   Staphylococcus aureus POSITIVE (A) NEGATIVE Final    Comment: (NOTE) The Xpert SA Assay (FDA approved for NASAL specimens in patients 39 years of age and older), is one component of a comprehensive surveillance program. It is not intended to diagnose infection nor to guide or  monitor treatment. Performed at [facility], [address] [place], [place] [address]   Urine Culture     Status: Abnormal   Collection Time: 07/08/18 12:33 PM  Result Value Ref Range Status   Specimen Description   Final    URINE, CLEAN CATCH Performed at [facility], [address] [place], [place] [address]    Special Requests   Final    Normal Performed at [facility], [place], [place] [address]    Culture >=100,000 COLONIES/mL ESCHERICHIA COLI (A)  Final   Report Status 07/10/2018 FINAL  Final   Organism ID, Bacteria ESCHERICHIA COLI (A)  Final      Susceptibility   Escherichia coli - MIC*    AMPICILLIN <=2 SENSITIVE Sensitive     CEFAZOLIN <=4 SENSITIVE Sensitive     CEFTRIAXONE <=1 SENSITIVE Sensitive     CIPROFLOXACIN <=0.25 SENSITIVE Sensitive     GENTAMICIN <=1 SENSITIVE Sensitive     IMIPENEM <=0.25 SENSITIVE Sensitive     NITROFURANTOIN <=16 SENSITIVE Sensitive     TRIMETH/SULFA <=20 SENSITIVE Sensitive     AMPICILLIN/SULBACTAM <=2 SENSITIVE Sensitive     PIP/TAZO <=4 SENSITIVE Sensitive     Extended ESBL NEGATIVE Sensitive     * >=100,000 COLONIES/mL ESCHERICHIA COLI  SARS Coronavirus 2 [facility] order, Performed in [place] hospital lab)     Status: None   Collection Time: 07/09/18  3:36 PM  Result Value Ref Range Status   SARS Coronavirus 2 NEGATIVE NEGATIVE Final    Comment: Performed at [facility], [address] [place], [place] [address]    RADIOLOGY:  Dg Chest Port 1 View  Result Date: 07/10/2018 CLINICAL DATA:  85 year old female with history of hypoxia. EXAM: PORTABLE CHEST 1 VIEW COMPARISON:  Chest x-ray 07/07/2018. FINDINGS: Lung volumes are normal. No consolidative airspace disease. No pleural effusions. No pneumothorax. No pulmonary nodule or mass noted. Pulmonary vasculature and the cardiomediastinal silhouette are within normal limits. Atherosclerosis in the thoracic aorta.  IMPRESSION: 1.  No radiographic evidence of acute cardiopulmonary disease. 2. Aortic atherosclerosis. Electronically Signed   By: Quillian Quince  Entrikin M.D.   On: 07/10/2018 15:15     Management plans discussed with the patient, family and they are in agreement.  CODE STATUS:     Code Status Orders  (From admission, onward)         Start     Ordered   07/07/18 2354  Full code  Continuous     07/07/18 2353        Code Status History    This patient has a current code status but no historical code status.      TOTAL TIME TAKING CARE OF THIS PATIENT: 35 minutes.    Loletha Grayer M.D on 07/11/2018 at 9:54 AM  Between 7am to 6pm - Pager - [phone]  After 6pm go to [email or web address] - password [email or web address]  [employer] Office  [phone]  CC: Primary care physician; [employer], [employer]

## 2018-07-11 NOTE — TOC Progression Note (Signed)
Transition of Care [facility]) - Progression Note    Patient Details  Name: Nancy Zhang MRN: [id number] Date of Birth: December 16, 1925  Transition of Care [facility]) CM/SW Contact  Su Hilt, RN Phone Number: 07/11/2018, 9:47 AM  Clinical Narrative:     Spoke to [name] at [phone] and reviewed bed offers, she chose [facility], she stated that her brother had a great experience there.   Spoke to [name] at [place] and accepted the bed offer, sent the therapy notes thru the Hub  The patient will go to room 31 A and be transported via EMS   Expected Discharge Plan: [place]    Expected Discharge Plan and Services Expected Discharge Plan: [place] arrangements for the past 2 months: Apartment Expected Discharge Date: 07/11/18                                     Social Determinants of Health (SDOH) Interventions    Readmission Risk Interventions No flowsheet data found.

## 2018-07-11 NOTE — Progress Notes (Signed)
PT Cancellation Note  Patient Details Name: Nancy Zhang MRN: [id number] DOB: [birth year]   Cancelled Treatment:    Reason Eval/Treat Not Completed: Other (comment) Chart reviewed. Patient drowsy and refusing food due to nausea. Patient requested PT return later for treatment. PT will follow up at later time/date.  Myles Gip PT, DPT [phone] 07/11/2018, 9:17 AM

## 2018-07-11 NOTE — Discharge Instructions (Signed)
Hip Fracture  A hip fracture is a break in the upper part of the thigh bone (femur). This is usually the result of an injury, commonly a fall. What are the causes? This condition may be caused by:  A direct hit or injury (trauma) to the side of the hip, such as from a fall or a car accident. What increases the risk? You are more likely to develop this condition if:  You have poor balance or an unsteady walking pattern (gait). Certain conditions contribute to poor balance, including Parkinson disease and dementia.  You have thinning or weakening of your bones, such as from osteopenia or osteoporosis.  You have cancer that spreads to the leg bones.  You have certain conditions that can weaken your bones, such as thyroid disorders, intestine disorders, or a lack (deficiency) of certain nutrients.  You smoke.  You take certain medicines, such as steroids.  You have a history of broken bones. What are the signs or symptoms? Symptoms of this condition include:  Pain over the injured hip. This is commonly felt on the side of the hip or in the front groin area.  Stiffness, bruising, and swelling over the hip.  Pain with movement of the leg, especially lifting it up. Pain often gets better with rest.  Difficulty or inability to stand, walk, or use the leg to support body weight (put weight on the leg).  The leg rolling outward when lying down.  The affected leg being shorter than the other leg. How is this diagnosed? This condition may be diagnosed based on:  Your symptoms.  A physical exam.  X-rays. These may be done: ? To confirm the diagnosis. ? To determine the type and location of the fracture. ? To check for other injuries.  MRI or CT scans. These may be done if the fracture is not visible on an X-ray. How is this treated? Treatment for this condition depends on the severity and location of your fracture. In most cases, surgery is necessary. Surgery may  involve:  Repairing the fracture with a screw, nail, or rod to hold the bone in place (open reduction and internal fixation, ORIF).  Replacing the damaged parts of the femur with metal implants (hemiarthroplasty or arthroplasty). If your fracture is less severe, or if you are not eligible for surgery, you may have non-surgical treatment. Non-surgical treatment may involve:  Using crutches, a walker, or a wheelchair until your health care provider says that you can support (bear) weight on your hip.  Medicines to help reduce pain and swelling.  Having regular X-rays to monitor your fracture and make sure that it is healing.  Physical therapy. You may need physical therapy after surgery, too. Follow these instructions at home: Activity  Do not use your injured leg to support your body weight until your health care provider says that you can. ? Follow standing and walking restrictions as told by your health care provider. ? Use crutches, a walker, or a wheelchair as directed.  Avoid any activities that cause pain or irritation in your hip. Ask your health care provider what activities are safe for you.  Do not drive or use heavy machinery until your health care provider approves.  If physical therapy was prescribed, do exercises as told by your health care provider. General instructions  Take over-the-counter and prescription medicines only as told by your health care provider.  If directed, put ice on the injured area: ? Put ice in a plastic bag. ?  Place a towel between your skin and the bag. ? Leave the ice on for 20 minutes, 2-3 times a day.  Do not use any products that contain nicotine or tobacco, such as cigarettes and e-cigarettes. These can delay bone healing. If you need help quitting, ask your health care provider.  Keep all follow-up visits as told by your health care provider. This is important. How is this prevented?  To prevent falls at home: ? Use a cane, walker,  or wheelchair as directed. ? Make sure your rooms and hallways are free of clutter, obstacles, and cords. ? Install grab bars in your bedroom and bathrooms. ? Always use handrails when going up and down stairs. ? Use nightlights around the house.  Exercise regularly. Ask what forms of exercise are safe for you, such as walking and strength and balance exercises.  Visit an eye doctor regularly to have your eyesight checked. This can help prevent falls.  Make sure you get enough calcium and vitamin D.  Do not use any products that contain nicotine or tobacco, such as cigarettes and e-cigarettes. If you need help quitting, ask your health care provider.  Limit alcohol use.  If you have an underlying condition that caused your hip fracture, work with your health care provider to manage your condition. Contact a health care provider if:  Your pain gets worse or it does not get better with rest or medicine.  You develop any of the following in your leg or foot: ? Numbness. ? Tingling. ? A change in skin color (discoloration). ? Skin feeling cold to the touch. Get help right away if:  Your pain suddenly gets worse.  You cannot move your hip. Summary  A hip fracture is a break in the upper part of the thigh bone (femur).  Treatment typically require surgical management to restore stability and function to the hip.  Pain medicine and icing of the affected leg can help manage pain and swelling. Follow directions as told by your health care provider. This information is not intended to replace advice given to you by your health care provider. Make sure you discuss any questions you have with your health care provider. Document Released: 03/02/2005 Document Revised: 11/20/2017 Document Reviewed: 04/04/2016 Elsevier Interactive Patient Education  2019 [facility]  o Remove items at home which could result in a fall. This includes throw rugs or furniture in  walking pathways o ICE to the affected joint every three hours while awake for 30 minutes at a time, for at least the first 3-5 days, and then as needed for pain and swelling.  Continue to use ice for pain and swelling. You may notice swelling that will progress down to the foot and ankle.  This is normal after surgery.  Elevate your leg when you are not up walking on it.   o Continue to use the breathing machine you got in the hospital (incentive spirometer) which will help keep your temperature down.  It is common for your temperature to cycle up and down following surgery, especially at night when you are not up moving around and exerting yourself.  The breathing machine keeps your lungs expanded and your temperature down.   DIET:  As you were doing prior to hospitalization, we recommend a well-balanced diet.  DRESSING / WOUND CARE / SHOWERING  Dressing change as needed.  No showering.  Staples will be removed at [facility] clinic in 2 weeks  ACTIVITY  o Increase activity  slowly as tolerated, but follow the weight bearing instructions below.   o No driving for 6 weeks or until further direction given by your physician.  You cannot drive while taking narcotics.  o No lifting or carrying greater than 10 lbs. until further directed by your surgeon. o Avoid periods of inactivity such as sitting longer than an hour when not asleep. This helps prevent blood clots.  o You may return to work once you are authorized by your doctor.     WEIGHT BEARING  Weightbearing as tolerated on the left   EXERCISES Gait training and ambulation with a walker.  Strengthening.  CONSTIPATION  Constipation is defined medically as fewer than three stools per week and severe constipation as less than one stool per week.  Even if you have a regular bowel pattern at home, your normal regimen is likely to be disrupted due to multiple reasons following surgery.  Combination of anesthesia, postoperative narcotics,  change in appetite and fluid intake all can affect your bowels.   YOU MUST use at least one of the following options; they are listed in order of increasing strength to get the job done.  They are all available over the counter, and you may need to use some, POSSIBLY even all of these options:    Drink plenty of fluids (prune juice may be helpful) and high fiber foods Colace 100 mg by mouth twice a day  Senokot for constipation as directed and as needed Dulcolax (bisacodyl), take with full glass of water  Miralax (polyethylene glycol) once or twice a day as needed.  If you have tried all these things and are unable to have a bowel movement in the first 3-4 days after surgery call either your surgeon or your primary doctor.    If you experience loose stools or diarrhea, hold the medications until you stool forms back up.  If your symptoms do not get better within 1 week or if they get worse, check with your doctor.  If you experience "the worst abdominal pain ever" or develop nausea or vomiting, please contact the office immediately for further recommendations for treatment.   ITCHING:  If you experience itching with your medications, try taking only a single pain pill, or even half a pain pill at a time.  You can also use Benadryl over the counter for itching or also to help with sleep.   TED HOSE STOCKINGS:  Use stockings on both legs until for at least 2 weeks or as directed by physician office. They may be removed at night for sleeping.  MEDICATIONS:  See your medication summary on the After Visit Summary that nursing will review with you.  You may have some home medications which will be placed on hold until you complete the course of blood thinner medication.  It is important for you to complete the blood thinner medication as prescribed.  PRECAUTIONS:  If you experience chest pain or shortness of breath - call 911 immediately for transfer to the hospital emergency department.   If you  develop a fever greater that 101 F, purulent drainage from wound, increased redness or drainage from wound, foul odor from the wound/dressing, or calf pain - CONTACT YOUR SURGEON.  FOLLOW-UP APPOINTMENTS:  If you do not already have a post-op appointment, please call the office for an appointment to be seen by your surgeon.  Guidelines for how soon to be seen are listed in your After Visit Summary, but are typically between 1-4 weeks after surgery.  OTHER INSTRUCTIONS:     MAKE SURE YOU:   Understand these instructions.   Get help right away if you are not doing well or get worse.    Thank you for letting us be a part of your medical care team.  It is a privilege we respect greatly.  We hope these instructions will help you stay on track for a fast and full recovery!

## 2018-08-16 DIAGNOSIS — Z8781 Personal history of (healed) traumatic fracture: Secondary | ICD-10-CM | POA: Insufficient documentation

## 2018-08-20 ENCOUNTER — Emergency Department
Admission: EM | Admit: 2018-08-20 | Discharge: 2018-08-20 | Disposition: A | Discharge status: Home / Self Care | Payer: Medicare Other | Attending: Student in an Organized Health Care Education/Training Program | Admitting: Student in an Organized Health Care Education/Training Program

## 2018-08-20 ENCOUNTER — Other Ambulatory Visit: Payer: Self-pay

## 2018-08-20 DIAGNOSIS — R110 Nausea: Secondary | ICD-10-CM | POA: Diagnosis not present

## 2018-08-20 DIAGNOSIS — N3090 Cystitis, unspecified without hematuria: Secondary | ICD-10-CM | POA: Diagnosis not present

## 2018-08-20 LAB — CBC WITH DIFFERENTIAL/PLATELET
Abs Immature Granulocytes: 0.03 10*3/uL (ref 0.00–0.07)
Basophils Absolute: 0 10*3/uL (ref 0.0–0.1)
Basophils Relative: 0 %
Eosinophils Absolute: 0.4 10*3/uL (ref 0.0–0.5)
Eosinophils Relative: 5 %
HCT: 36.6 % (ref 36.0–46.0)
Hemoglobin: 11.6 g/dL — ABNORMAL LOW (ref 12.0–15.0)
Immature Granulocytes: 0 %
Lymphocytes Relative: 20 %
Lymphs Abs: 1.4 10*3/uL (ref 0.7–4.0)
MCH: 27.7 pg (ref 26.0–34.0)
MCHC: 31.7 g/dL (ref 30.0–36.0)
MCV: 87.4 fL (ref 80.0–100.0)
Monocytes Absolute: 0.7 10*3/uL (ref 0.1–1.0)
Monocytes Relative: 10 %
Neutro Abs: 4.5 10*3/uL (ref 1.7–7.7)
Neutrophils Relative %: 65 %
Platelets: 323 10*3/uL (ref 150–400)
RBC: 4.19 MIL/uL (ref 3.87–5.11)
RDW: 13.5 % (ref 11.5–15.5)
WBC: 7.1 10*3/uL (ref 4.0–10.5)
nRBC: 0 % (ref 0.0–0.2)

## 2018-08-20 LAB — URINALYSIS, COMPLETE (UACMP) WITH MICROSCOPIC
Bilirubin Urine: NEGATIVE
Glucose, UA: NEGATIVE mg/dL
Ketones, ur: NEGATIVE mg/dL
Leukocytes,Ua: NEGATIVE
Nitrite: POSITIVE — AB
Protein, ur: 30 mg/dL — AB
Specific Gravity, Urine: 1.014 (ref 1.005–1.030)
pH: 5 (ref 5.0–8.0)

## 2018-08-20 LAB — COMPREHENSIVE METABOLIC PANEL
ALT: 11 U/L (ref 0–44)
AST: 17 U/L (ref 15–41)
Albumin: 3.4 g/dL — ABNORMAL LOW (ref 3.5–5.0)
Alkaline Phosphatase: 169 U/L — ABNORMAL HIGH (ref 38–126)
Anion gap: 10 (ref 5–15)
BUN: 25 mg/dL — ABNORMAL HIGH (ref 8–23)
CO2: 26 mmol/L (ref 22–32)
Calcium: 8.8 mg/dL — ABNORMAL LOW (ref 8.9–10.3)
Chloride: 104 mmol/L (ref 98–111)
Creatinine, Ser: 0.85 mg/dL (ref 0.44–1.00)
GFR calc Af Amer: >60 mL/min (ref >60)
GFR calc non Af Amer: >60 mL/min (ref >60)
Glucose, Bld: 152 mg/dL — ABNORMAL HIGH (ref 70–99)
Potassium: 3.7 mmol/L (ref 3.5–5.1)
Sodium: 140 mmol/L (ref 135–145)
Total Bilirubin: 0.6 mg/dL (ref 0.3–1.2)
Total Protein: 6.8 g/dL (ref 6.5–8.1)

## 2018-08-20 LAB — TROPONIN I
Troponin I: 0.03 ng/mL (ref <0.03)
Troponin I: 0.03 ng/mL (ref <0.03)

## 2018-08-20 LAB — LIPASE, BLOOD: Lipase: 72 U/L — ABNORMAL HIGH (ref 11–51)

## 2018-08-20 MED ORDER — AMLODIPINE BESYLATE 5 MG PO TABS
2.5000 mg | ORAL_TABLET | Freq: Once | ORAL | Status: AC
  Administered 2018-08-20: 2.5 mg via ORAL
  Filled 2018-08-20: qty 1

## 2018-08-20 MED ORDER — ONDANSETRON HCL 4 MG/2ML IJ SOLN
4.0000 mg | Freq: Once | INTRAMUSCULAR | Status: AC
  Administered 2018-08-20: 07:00:00 4 mg via INTRAVENOUS
  Filled 2018-08-20: qty 2

## 2018-08-20 MED ORDER — SODIUM CHLORIDE 0.9 % IV SOLN
1.0000 g | Freq: Once | INTRAVENOUS | Status: AC
  Administered 2018-08-20: 1 g via INTRAVENOUS
  Filled 2018-08-20: qty 10

## 2018-08-20 MED ORDER — CEPHALEXIN 500 MG PO CAPS: 500.0000 mg | ORAL_CAPSULE | Freq: Two times a day (BID) | ORAL | 0 refills | Status: DC

## 2018-08-20 MED ORDER — SODIUM CHLORIDE 0.9 % IV BOLUS
1000.0000 mL | Freq: Once | INTRAVENOUS | Status: AC
  Administered 2018-08-20: 1000 mL via INTRAVENOUS

## 2018-08-20 MED ORDER — ONDANSETRON HCL 4 MG PO TABS: 4.0000 mg | ORAL_TABLET | Freq: Every day | ORAL | 0 refills | Status: AC | PRN

## 2018-08-20 NOTE — ED Triage Notes (Signed)
Daughter reports discharged from [facility] after receiving Rehab after hip surgery.  Reports started vomiting last night.
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
## 2018-08-20 NOTE — ED Notes (Signed)
Pt able to eat a meal tray without trouble or nausea.

## 2018-08-21 NOTE — Progress Notes (Signed)
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  INTERVAL HISTORY: Patient returns to clinic today for further evaluation and continuation of Prolia.  She has chronic weakness and fatigue, but otherwise feels well.  She has no neurologic complaints. She denies any recent fevers or illnesses. She has a good appetite and denies weight loss. She denies any pain.  She has no chest pain, shortness of breath, cough, or hemoptysis.  She denies any nausea, vomiting, constipation, or diarrhea.  She has no urinary complaints.  Patient offers no further specific complaints today.  REVIEW OF SYSTEMS:   Review of Systems  Constitutional: Positive for malaise/fatigue. Negative for fever and weight loss.  Respiratory: Negative.  Negative for cough and shortness of breath.   Cardiovascular: Negative.  Negative for chest pain and leg swelling.  Gastrointestinal: Negative.  Negative for abdominal pain.  Genitourinary: Negative.  Negative for dysuria.  Musculoskeletal: Negative.  Negative for back pain.  Skin: Negative.  Negative for rash.  Neurological: Positive for weakness. Negative for dizziness, sensory change, focal weakness and headaches.  Psychiatric/Behavioral: Negative.  The patient is not nervous/anxious.     As per HPI. Otherwise, a complete review of systems is negative.  PAST MEDICAL HISTORY: Past Medical History:  Diagnosis Date  . Breast cancer ([place]) 2010   RT LUMPECTOMY  . Diabetes ([place])   . Diverticulosis   . Hypercholesterolemia   . Hypertension   . Intention tremor   . Personal history of chemotherapy 2010   BREAST CA  . Personal history of radiation therapy 2010   BREAST CA    PAST  SURGICAL HISTORY: Past Surgical History:  Procedure Laterality Date  . ABDOMINAL HYSTERECTOMY    . APPENDECTOMY    . BREAST EXCISIONAL BIOPSY Right 2010   positive  . INTRAMEDULLARY (IM) NAIL INTERTROCHANTERIC Left 07/08/2018   Procedure: INTRAMEDULLARY (IM) NAIL INTERTROCHANTRIC;  Surgeon: Leim Fabry, MD;  Location: [facility] ORS;  Service: Orthopedics;  Laterality: Left;    FAMILY HISTORY Family History  Problem Relation Age of Onset  . Breast cancer Daughter 8       ADVANCED DIRECTIVES:    HEALTH MAINTENANCE: Social History   Tobacco Use  . Smoking status: Never Smoker  . Smokeless tobacco: Never Used  Substance Use Topics  . Alcohol use: No  . Drug use: No     Colonoscopy:  PAP:  Bone density:  Lipid panel:  Allergies  Allergen Reactions  . Shellfish Allergy Swelling    Current Outpatient Medications  Medication Sig Dispense Refill  . acetaminophen (TYLENOL) 325 MG tablet Take 2 tablets (650 mg total) by mouth every 6 (six) hours as needed for mild pain, fever or headache. 60 tablet 0  . amLODipine (NORVASC) 2.5 MG tablet Take 1 tablet (2.5 mg total) by mouth daily. 30 tablet 0  . atorvastatin (LIPITOR) 10 MG tablet Take 10 mg by mouth daily at 6 PM.     . docusate sodium (COLACE) 100 MG capsule Take 1 capsule (100 mg total) by mouth 2 (two) times daily. 60 capsule 0  . enoxaparin (LOVENOX) 40 MG/0.4ML injection Inject 0.4 mLs (40 mg total) into the skin daily. 14 Syringe 0  . metFORMIN (GLUCOPHAGE-XR) 500 MG 24 hr tablet Take 1  tablet by mouth 2 (two) times a day.    . ondansetron (ZOFRAN) 4 MG tablet Take 1 tablet (4 mg total) by mouth daily as needed. 14 tablet 0  . oxyCODONE (OXY IR/ROXICODONE) 5 MG immediate release tablet Take 0.5-1 tablets (2.5-5 mg total) by mouth every 4 (four) hours as needed for moderate pain or severe pain (pain score 4-6). 30 tablet 0  . quinapril (ACCUPRIL) 10 MG tablet Take 1 tablet (10 mg total) by mouth daily. 30 tablet 0  .  traMADol (ULTRAM) 50 MG tablet Take 1 tablet (50 mg total) by mouth every 6 (six) hours as needed for moderate pain. 30 tablet 1   No current facility-administered medications for this visit.     OBJECTIVE: Vitals:   08/24/18 1022  BP: (!) 155/77  Pulse: 94  Temp: 99.1 F (37.3 C)     Body mass index is 25.24 kg/m.    ECOG FS:0 - Asymptomatic  General: Well-developed, well-nourished, no acute distress. Eyes: Pink conjunctiva, anicteric sclera. HEENT: Normocephalic, moist mucous membranes. Lungs: Clear to auscultation bilaterally. Heart: Regular rate and rhythm. No rubs, murmurs, or gallops. Abdomen: Soft, nontender, nondistended. No organomegaly noted, normoactive bowel sounds. Musculoskeletal: No edema, cyanosis, or clubbing. Neuro: Alert, answering all questions appropriately. Cranial nerves grossly intact. Skin: No rashes or petechiae noted. Psych: Normal affect.  LAB RESULTS:  Lab Results  Component Value Date   NA 136 08/24/2018   K 3.5 08/24/2018   CL 96 (L) 08/24/2018   CO2 30 08/24/2018   GLUCOSE 157 (H) 08/24/2018   BUN 21 08/24/2018   CREATININE 0.99 08/24/2018   CALCIUM 9.7 08/24/2018   PROT 6.8 08/20/2018   ALBUMIN 3.4 (L) 08/20/2018   AST 17 08/20/2018   ALT 11 08/20/2018   ALKPHOS 169 (H) 08/20/2018   BILITOT 0.6 08/20/2018   GFRNONAA 50 (L) 08/24/2018   GFRAA 58 (L) 08/24/2018    Lab Results  Component Value Date   WBC 7.9 08/24/2018   NEUTROABS 5.9 08/24/2018   HGB 11.2 (L) 08/24/2018   HCT 35.4 (L) 08/24/2018   MCV 85.9 08/24/2018   PLT 251 08/24/2018     STUDIES: No results found.  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  Patient completed 5 years of letrozole in July 2016.  Her most recent mammogram on  May 04, 2018 was reported as BI-RADS 1.  Repeat in February 2021.   2.  Osteoporosis: Patient's most recent bone mineral density on May 04, 2018 reported T score of -2.9 which is improved from -3.2 over the past year.  Proceed with Prolia today.  Return to clinic in 6 months for routine evaluation and continuation of treatment.    I spent a total of 20 minutes face-to-face with the patient of which greater than 50% of the visit was spent in counseling and coordination of care as detailed above.   Patient expressed understanding and was in agreement with this plan. She also understands that She can call clinic at any time with any questions, concerns, or complaints.    Lloyd Huger, MD   08/25/2018 6:19 AM

## 2018-08-23 ENCOUNTER — Other Ambulatory Visit: Payer: Self-pay

## 2018-08-24 ENCOUNTER — Inpatient Hospital Stay (HOSPITAL_BASED_OUTPATIENT_CLINIC_OR_DEPARTMENT_OTHER): Payer: Medicare Other | Admitting: Oncology

## 2018-08-24 ENCOUNTER — Other Ambulatory Visit: Payer: Self-pay

## 2018-08-24 ENCOUNTER — Encounter: Payer: Self-pay | Admitting: Oncology

## 2018-08-24 ENCOUNTER — Inpatient Hospital Stay: Payer: Medicare Other | Attending: Oncology

## 2018-08-24 ENCOUNTER — Inpatient Hospital Stay: Payer: Medicare Other

## 2018-08-24 VITALS — BP 155/77 | HR 94 | Temp 99.1°F | Ht 62.0 in | Wt 138.0 lb

## 2018-08-24 DIAGNOSIS — M810 Age-related osteoporosis without current pathological fracture: Secondary | ICD-10-CM

## 2018-08-24 DIAGNOSIS — Z803 Family history of malignant neoplasm of breast: Secondary | ICD-10-CM | POA: Insufficient documentation

## 2018-08-24 DIAGNOSIS — Z853 Personal history of malignant neoplasm of breast: Secondary | ICD-10-CM

## 2018-08-24 DIAGNOSIS — Z9221 Personal history of antineoplastic chemotherapy: Secondary | ICD-10-CM | POA: Diagnosis not present

## 2018-08-24 DIAGNOSIS — I10 Essential (primary) hypertension: Secondary | ICD-10-CM | POA: Insufficient documentation

## 2018-08-24 DIAGNOSIS — E7800 Pure hypercholesterolemia, unspecified: Secondary | ICD-10-CM

## 2018-08-24 DIAGNOSIS — Z7984 Long term (current) use of oral hypoglycemic drugs: Secondary | ICD-10-CM | POA: Insufficient documentation

## 2018-08-24 DIAGNOSIS — R5381 Other malaise: Secondary | ICD-10-CM | POA: Insufficient documentation

## 2018-08-24 DIAGNOSIS — Z79899 Other long term (current) drug therapy: Secondary | ICD-10-CM

## 2018-08-24 DIAGNOSIS — Z9223 Personal history of estrogen therapy: Secondary | ICD-10-CM | POA: Insufficient documentation

## 2018-08-24 DIAGNOSIS — C50411 Malignant neoplasm of upper-outer quadrant of right female breast: Secondary | ICD-10-CM

## 2018-08-24 DIAGNOSIS — Z170 Estrogen receptor positive status [ER+]: Secondary | ICD-10-CM

## 2018-08-24 DIAGNOSIS — R531 Weakness: Secondary | ICD-10-CM | POA: Insufficient documentation

## 2018-08-24 DIAGNOSIS — Z923 Personal history of irradiation: Secondary | ICD-10-CM | POA: Insufficient documentation

## 2018-08-24 DIAGNOSIS — R5383 Other fatigue: Secondary | ICD-10-CM | POA: Insufficient documentation

## 2018-08-24 DIAGNOSIS — E119 Type 2 diabetes mellitus without complications: Secondary | ICD-10-CM

## 2018-08-24 DIAGNOSIS — Z7901 Long term (current) use of anticoagulants: Secondary | ICD-10-CM | POA: Diagnosis not present

## 2018-08-24 LAB — BASIC METABOLIC PANEL
Anion gap: 10 (ref 5–15)
BUN: 21 mg/dL (ref 8–23)
CO2: 30 mmol/L (ref 22–32)
Calcium: 9.7 mg/dL (ref 8.9–10.3)
Chloride: 96 mmol/L — ABNORMAL LOW (ref 98–111)
Creatinine, Ser: 0.99 mg/dL (ref 0.44–1.00)
GFR calc Af Amer: 58 mL/min — ABNORMAL LOW (ref >60)
GFR calc non Af Amer: 50 mL/min — ABNORMAL LOW (ref >60)
Glucose, Bld: 157 mg/dL — ABNORMAL HIGH (ref 70–99)
Potassium: 3.5 mmol/L (ref 3.5–5.1)
Sodium: 136 mmol/L (ref 135–145)

## 2018-08-24 LAB — CBC WITH DIFFERENTIAL/PLATELET
Abs Immature Granulocytes: 0.03 10*3/uL (ref 0.00–0.07)
Basophils Absolute: 0 10*3/uL (ref 0.0–0.1)
Basophils Relative: 1 %
Eosinophils Absolute: 0.3 10*3/uL (ref 0.0–0.5)
Eosinophils Relative: 3 %
HCT: 35.4 % — ABNORMAL LOW (ref 36.0–46.0)
Hemoglobin: 11.2 g/dL — ABNORMAL LOW (ref 12.0–15.0)
Immature Granulocytes: 0 %
Lymphocytes Relative: 12 %
Lymphs Abs: 0.9 10*3/uL (ref 0.7–4.0)
MCH: 27.2 pg (ref 26.0–34.0)
MCHC: 31.6 g/dL (ref 30.0–36.0)
MCV: 85.9 fL (ref 80.0–100.0)
Monocytes Absolute: 0.8 10*3/uL (ref 0.1–1.0)
Monocytes Relative: 10 %
Neutro Abs: 5.9 10*3/uL (ref 1.7–7.7)
Neutrophils Relative %: 74 %
Platelets: 251 10*3/uL (ref 150–400)
RBC: 4.12 MIL/uL (ref 3.87–5.11)
RDW: 13.1 % (ref 11.5–15.5)
WBC: 7.9 10*3/uL (ref 4.0–10.5)
nRBC: 0 % (ref 0.0–0.2)

## 2018-08-24 MED ORDER — DENOSUMAB 60 MG/ML ~~LOC~~ SOSY
60.0000 mg | PREFILLED_SYRINGE | Freq: Once | SUBCUTANEOUS | Status: AC
  Administered 2018-08-24: 60 mg via SUBCUTANEOUS
  Filled 2018-08-24: qty 1

## 2018-08-24 NOTE — Progress Notes (Signed)
Patient stated that she had been doing well today. Patient's last mammogram and Bone density was done on 05/04/2018.

## 2018-08-25 LAB — CANCER ANTIGEN 27.29: CA 27.29: 31.7 U/mL (ref 0.0–38.6)

## 2018-11-11 ENCOUNTER — Encounter: Payer: Self-pay | Admitting: Emergency Medicine

## 2018-11-11 ENCOUNTER — Other Ambulatory Visit: Payer: Self-pay

## 2018-11-11 DIAGNOSIS — Z853 Personal history of malignant neoplasm of breast: Secondary | ICD-10-CM | POA: Insufficient documentation

## 2018-11-11 DIAGNOSIS — Z79899 Other long term (current) drug therapy: Secondary | ICD-10-CM | POA: Diagnosis not present

## 2018-11-11 DIAGNOSIS — I10 Essential (primary) hypertension: Secondary | ICD-10-CM | POA: Insufficient documentation

## 2018-11-11 DIAGNOSIS — Z9221 Personal history of antineoplastic chemotherapy: Secondary | ICD-10-CM | POA: Insufficient documentation

## 2018-11-11 DIAGNOSIS — E119 Type 2 diabetes mellitus without complications: Secondary | ICD-10-CM | POA: Diagnosis not present

## 2018-11-11 DIAGNOSIS — Z7984 Long term (current) use of oral hypoglycemic drugs: Secondary | ICD-10-CM | POA: Insufficient documentation

## 2018-11-11 DIAGNOSIS — Z923 Personal history of irradiation: Secondary | ICD-10-CM | POA: Diagnosis not present

## 2018-11-11 DIAGNOSIS — N3000 Acute cystitis without hematuria: Secondary | ICD-10-CM | POA: Insufficient documentation

## 2018-11-11 DIAGNOSIS — R300 Dysuria: Secondary | ICD-10-CM | POA: Diagnosis present

## 2018-11-11 LAB — URINALYSIS, COMPLETE (UACMP) WITH MICROSCOPIC
Bilirubin Urine: NEGATIVE
Glucose, UA: NEGATIVE mg/dL
Ketones, ur: NEGATIVE mg/dL
Nitrite: NEGATIVE
Protein, ur: 30 mg/dL — AB
Specific Gravity, Urine: 1.012 (ref 1.005–1.030)
pH: 6 (ref 5.0–8.0)

## 2018-11-11 NOTE — ED Triage Notes (Signed)
Patient with complaint of pain with urination that started yesterday.

## 2018-11-12 ENCOUNTER — Emergency Department
Admission: EM | Admit: 2018-11-12 | Discharge: 2018-11-12 | Disposition: A | Discharge status: Home / Self Care | Payer: Medicare Other | Attending: Emergency Medicine | Admitting: Emergency Medicine

## 2018-11-12 DIAGNOSIS — N3000 Acute cystitis without hematuria: Secondary | ICD-10-CM

## 2018-11-12 LAB — CBC WITH DIFFERENTIAL/PLATELET
Abs Immature Granulocytes: 0.03 10*3/uL (ref 0.00–0.07)
Basophils Absolute: 0 10*3/uL (ref 0.0–0.1)
Basophils Relative: 0 %
Eosinophils Absolute: 0.1 10*3/uL (ref 0.0–0.5)
Eosinophils Relative: 1 %
HCT: 40.3 % (ref 36.0–46.0)
Hemoglobin: 12.6 g/dL (ref 12.0–15.0)
Immature Granulocytes: 0 %
Lymphocytes Relative: 16 %
Lymphs Abs: 1.6 10*3/uL (ref 0.7–4.0)
MCH: 26.1 pg (ref 26.0–34.0)
MCHC: 31.3 g/dL (ref 30.0–36.0)
MCV: 83.4 fL (ref 80.0–100.0)
Monocytes Absolute: 0.9 10*3/uL (ref 0.1–1.0)
Monocytes Relative: 9 %
Neutro Abs: 7.2 10*3/uL (ref 1.7–7.7)
Neutrophils Relative %: 74 %
Platelets: 236 10*3/uL (ref 150–400)
RBC: 4.83 MIL/uL (ref 3.87–5.11)
RDW: 15.2 % (ref 11.5–15.5)
WBC: 9.9 10*3/uL (ref 4.0–10.5)
nRBC: 0 % (ref 0.0–0.2)

## 2018-11-12 LAB — BASIC METABOLIC PANEL
Anion gap: 12 (ref 5–15)
BUN: 21 mg/dL (ref 8–23)
CO2: 25 mmol/L (ref 22–32)
Calcium: 9.1 mg/dL (ref 8.9–10.3)
Chloride: 100 mmol/L (ref 98–111)
Creatinine, Ser: 0.87 mg/dL (ref 0.44–1.00)
GFR calc Af Amer: >60 mL/min (ref >60)
GFR calc non Af Amer: 58 mL/min — ABNORMAL LOW (ref >60)
Glucose, Bld: 178 mg/dL — ABNORMAL HIGH (ref 70–99)
Potassium: 4.1 mmol/L (ref 3.5–5.1)
Sodium: 137 mmol/L (ref 135–145)

## 2018-11-12 MED ORDER — SODIUM CHLORIDE 0.9 % IV SOLN
1.0000 g | Freq: Once | INTRAVENOUS | Status: AC
  Administered 2018-11-12: 1 g via INTRAVENOUS
  Filled 2018-11-12: qty 10

## 2018-11-12 MED ORDER — CEPHALEXIN 500 MG PO CAPS: 500.0000 mg | ORAL_CAPSULE | Freq: Three times a day (TID) | ORAL | 0 refills | Status: AC

## 2018-11-12 NOTE — ED Provider Notes (Signed)
[facility] Emergency Department Provider Note  ____________________________________________  Time seen: Approximately 2:59 AM  I have reviewed the triage vital signs and the nursing notes.   HISTORY  Chief Complaint Dysuria   HPI Nancy Zhang is a 85 y.o. female with history of breast cancer, diabetes, hypertension, hyperlipidemia who presents for evaluation of dysuria.  Patient is complaining of 7 days of dysuria.  No abdominal pain, no nausea or vomiting, no fever or chills, no flank pain.  She has had several prior UTIs in the past.   Past Medical History:  Diagnosis Date  . Breast cancer ([place]) 2010   RT LUMPECTOMY  . Diabetes ([place])   . Diverticulosis   . Hypercholesterolemia   . Hypertension   . Intention tremor   . Personal history of chemotherapy 2010   BREAST CA  . Personal history of radiation therapy 2010   BREAST CA    Patient Active Problem List   Diagnosis Date Noted  . Status post closed fracture of left femur 08/16/2018  . Closed left hip fracture ([place]) 07/07/2018  . Osteoporosis 12/21/2017  . Primary cancer of upper outer quadrant of right female breast ([place]) 12/03/2015  . Chronic contact dermatitis 04/24/2013  . Diabetes ([place]) 04/24/2013  . Diverticulosis 04/24/2013  . HTN (hypertension) 04/24/2013  . Hypercholesterolemia 04/24/2013  . Intention tremor 04/24/2013    Past Surgical History:  Procedure Laterality Date  . ABDOMINAL HYSTERECTOMY    . APPENDECTOMY    . BREAST EXCISIONAL BIOPSY Right 2010   positive  . INTRAMEDULLARY (IM) NAIL INTERTROCHANTERIC Left 07/08/2018   Procedure: INTRAMEDULLARY (IM) NAIL INTERTROCHANTRIC;  Surgeon: Leim Fabry, MD;  Location: [facility] ORS;  Service: Orthopedics;  Laterality: Left;    Prior to Admission medications   Medication Sig Start Date End Date Taking? Authorizing Provider  acetaminophen (TYLENOL) 325 MG tablet Take 2 tablets (650 mg total) by mouth every 6 (six) hours as  needed for mild pain, fever or headache. 07/11/18   Loletha Grayer, MD  amLODipine (NORVASC) 2.5 MG tablet Take 1 tablet (2.5 mg total) by mouth daily. 07/12/18   Loletha Grayer, MD  atorvastatin (LIPITOR) 10 MG tablet Take 10 mg by mouth daily at 6 PM.     [provider]  cephALEXin (KEFLEX) 500 MG capsule Take 1 capsule (500 mg total) by mouth 3 (three) times daily for 7 days. 11/12/18 11/19/18  Rudene Re, MD  docusate sodium (COLACE) 100 MG capsule Take 1 capsule (100 mg total) by mouth 2 (two) times daily. 07/11/18   Loletha Grayer, MD  enoxaparin (LOVENOX) 40 MG/0.4ML injection Inject 0.4 mLs (40 mg total) into the skin daily. 07/10/18   Reche Dixon, PA-C  metFORMIN (GLUCOPHAGE-XR) 500 MG 24 hr tablet Take 1 tablet by mouth 2 (two) times a day. 07/26/18   [provider]  ondansetron (ZOFRAN) 4 MG tablet Take 1 tablet (4 mg total) by mouth daily as needed. 08/20/18 08/20/19  Merlyn Lot, MD  oxyCODONE (OXY IR/ROXICODONE) 5 MG immediate release tablet Take 0.5-1 tablets (2.5-5 mg total) by mouth every 4 (four) hours as needed for moderate pain or severe pain (pain score 4-6). 07/10/18   Reche Dixon, PA-C  quinapril (ACCUPRIL) 10 MG tablet Take 1 tablet (10 mg total) by mouth daily. 07/12/18   Loletha Grayer, MD  traMADol (ULTRAM) 50 MG tablet Take 1 tablet (50 mg total) by mouth every 6 (six) hours as needed for moderate pain. 07/10/18   Reche Dixon, PA-C  Allergies Shellfish allergy  Family History  Problem Relation Age of Onset  . Breast cancer Daughter 76    Social History Social History   Tobacco Use  . Smoking status: Never Smoker  . Smokeless tobacco: Never Used  Substance Use Topics  . Alcohol use: No  . Drug use: No    Review of Systems  Constitutional: Negative for fever. Eyes: Negative for visual changes. ENT: Negative for sore throat. Neck: No neck pain  Cardiovascular: Negative for chest pain. Respiratory: Negative for shortness of  breath. Gastrointestinal: Negative for abdominal pain, vomiting or diarrhea. Genitourinary: + dysuria. Musculoskeletal: Negative for back pain. Skin: Negative for rash. Neurological: Negative for headaches, weakness or numbness. Psych: No SI or HI  ____________________________________________   PHYSICAL EXAM:  VITAL SIGNS: ED Triage Vitals  Enc Vitals Group     BP 11/11/18 2034 (!) 147/103     Pulse Rate 11/11/18 2034 83     Resp 11/11/18 2118 18     Temp 11/11/18 2034 99.2 F (37.3 C)     Temp Source 11/11/18 2034 Oral     SpO2 11/11/18 2034 97 %     Weight 11/11/18 2118 122 lb (55.3 kg)     Height 11/11/18 2118 5\' 3"  (1.6 m)     Head Circumference --      Peak Flow --      Pain Score 11/11/18 2118 0     Pain Loc --      Pain Edu? --      Excl. in [place]? --     Constitutional: Alert and oriented. Well appearing and in no apparent distress. HEENT:      Head: Normocephalic and atraumatic.         Eyes: Conjunctivae are normal. Sclera is non-icteric.       Mouth/Throat: Mucous membranes are moist.       Neck: Supple with no signs of meningismus. Cardiovascular: Regular rate and rhythm. No murmurs, gallops, or rubs. 2+ symmetrical distal pulses are present in all extremities. No JVD. Respiratory: Normal respiratory effort. Lungs are clear to auscultation bilaterally. No wheezes, crackles, or rhonchi.  Gastrointestinal: Soft, non tender, and non distended with positive bowel sounds. No rebound or guarding. Genitourinary: No CVA tenderness. Musculoskeletal: Nontender with normal range of motion in all extremities. No edema, cyanosis, or erythema of extremities. Neurologic: Normal speech and language. Face is symmetric. Moving all extremities. No gross focal neurologic deficits are appreciated. Skin: Skin is warm, dry and intact. No rash noted. Psychiatric: Mood and affect are normal. Speech and behavior are normal.  ____________________________________________   LABS (all  labs ordered are listed, but only abnormal results are displayed)  Labs Reviewed  URINALYSIS, COMPLETE (UACMP) WITH MICROSCOPIC - Abnormal; Notable for the following components:      Result Value   Color, Urine YELLOW (*)    APPearance CLEAR (*)    Hgb urine dipstick MODERATE (*)    Protein, ur 30 (*)    Leukocytes,Ua TRACE (*)    Bacteria, UA MANY (*)    All other components within normal limits  URINE CULTURE  CBC WITH DIFFERENTIAL/PLATELET  BASIC METABOLIC PANEL   ____________________________________________  EKG  none  ____________________________________________  RADIOLOGY  none  ____________________________________________   PROCEDURES  Procedure(s) performed: None Procedures Critical Care performed:  None ____________________________________________   INITIAL IMPRESSION / ASSESSMENT AND PLAN / ED COURSE  85 y.o. female with history of breast cancer, diabetes, hypertension, hyperlipidemia who presents for evaluation of  dysuria UA positive for UTI. Vitals with no fever or tachycardia. Labs with no leukocytosis, kidney dysfunction, DKA.No signs of sepsis.  Culture sent.  Prior urine cultures grew pansensitive E. coli.  Patient given a dose of Rocephin.  Will discharge home on Keflex.  Discussed return precautions and follow-up with PCP with patient and her daughter       As part of my medical decision making, I reviewed the following data within the [place] History obtained from family, Nursing notes reviewed and incorporated, Labs reviewed , Old chart reviewed, Notes from prior ED visits and [place] Controlled Substance Database   Patient was evaluated in Emergency Department today for the symptoms described in the history of present illness. Patient was evaluated in the context of the global COVID-19 pandemic, which necessitated consideration that the patient might be at risk for infection with the SARS-CoV-2 virus that causes COVID-19.  Institutional protocols and algorithms that pertain to the evaluation of patients at risk for COVID-19 are in a state of rapid change based on information released by regulatory bodies including the CDC and federal and state organizations. These policies and algorithms were followed during the patient's care in the ED.   ____________________________________________   FINAL CLINICAL IMPRESSION(S) / ED DIAGNOSES   Final diagnoses:  Acute cystitis without hematuria      NEW MEDICATIONS STARTED DURING THIS VISIT:  ED Discharge Orders         Ordered    cephALEXin (KEFLEX) 500 MG capsule  3 times daily     11/12/18 0302           Note:  This document was prepared using Dragon voice recognition software and may include unintentional dictation errors.    Alfred Levins, [place], MD 11/12/18 [phone]

## 2018-11-12 NOTE — ED Notes (Signed)
Patient given a warm blanket at this time.  

## 2018-11-12 NOTE — Discharge Instructions (Signed)

## 2018-11-12 NOTE — ED Notes (Signed)
Daughter called to come back to room 16 with pt. PT states she is not sure why she is here

## 2018-11-12 NOTE — ED Notes (Signed)
Patient assisted to bathroom 

## 2018-11-14 LAB — URINE CULTURE: Culture: >=100000 — AB

## 2019-02-23 ENCOUNTER — Ambulatory Visit: Payer: Medicare Other | Admitting: Oncology

## 2019-02-23 ENCOUNTER — Other Ambulatory Visit: Payer: Medicare Other

## 2019-02-23 ENCOUNTER — Ambulatory Visit: Payer: Medicare Other

## 2019-11-28 ENCOUNTER — Other Ambulatory Visit: Payer: Self-pay | Admitting: Adult Health

## 2019-11-28 DIAGNOSIS — Z1231 Encounter for screening mammogram for malignant neoplasm of breast: Secondary | ICD-10-CM

## 2020-04-30 ENCOUNTER — Other Ambulatory Visit: Payer: Self-pay

## 2020-04-30 ENCOUNTER — Ambulatory Visit
Admission: RE | Admit: 2020-04-30 | Discharge: 2020-04-30 | Disposition: A | Discharge status: Home / Self Care | Payer: Medicare (Managed Care) | Source: Ambulatory Visit | Attending: Adult Health | Admitting: Adult Health

## 2020-04-30 DIAGNOSIS — Z1231 Encounter for screening mammogram for malignant neoplasm of breast: Secondary | ICD-10-CM | POA: Diagnosis not present

## 2020-06-12 IMAGING — CR DG HIP (WITH OR WITHOUT PELVIS) 2-3V LEFT
3 series · 3 of 3 positions shown · non-contrast
Comparison: None.

CLINICAL DATA: Fell. Pain.

EXAM:
DG HIP (WITH OR WITHOUT PELVIS) 2-3V LEFT

[pelvis ap]
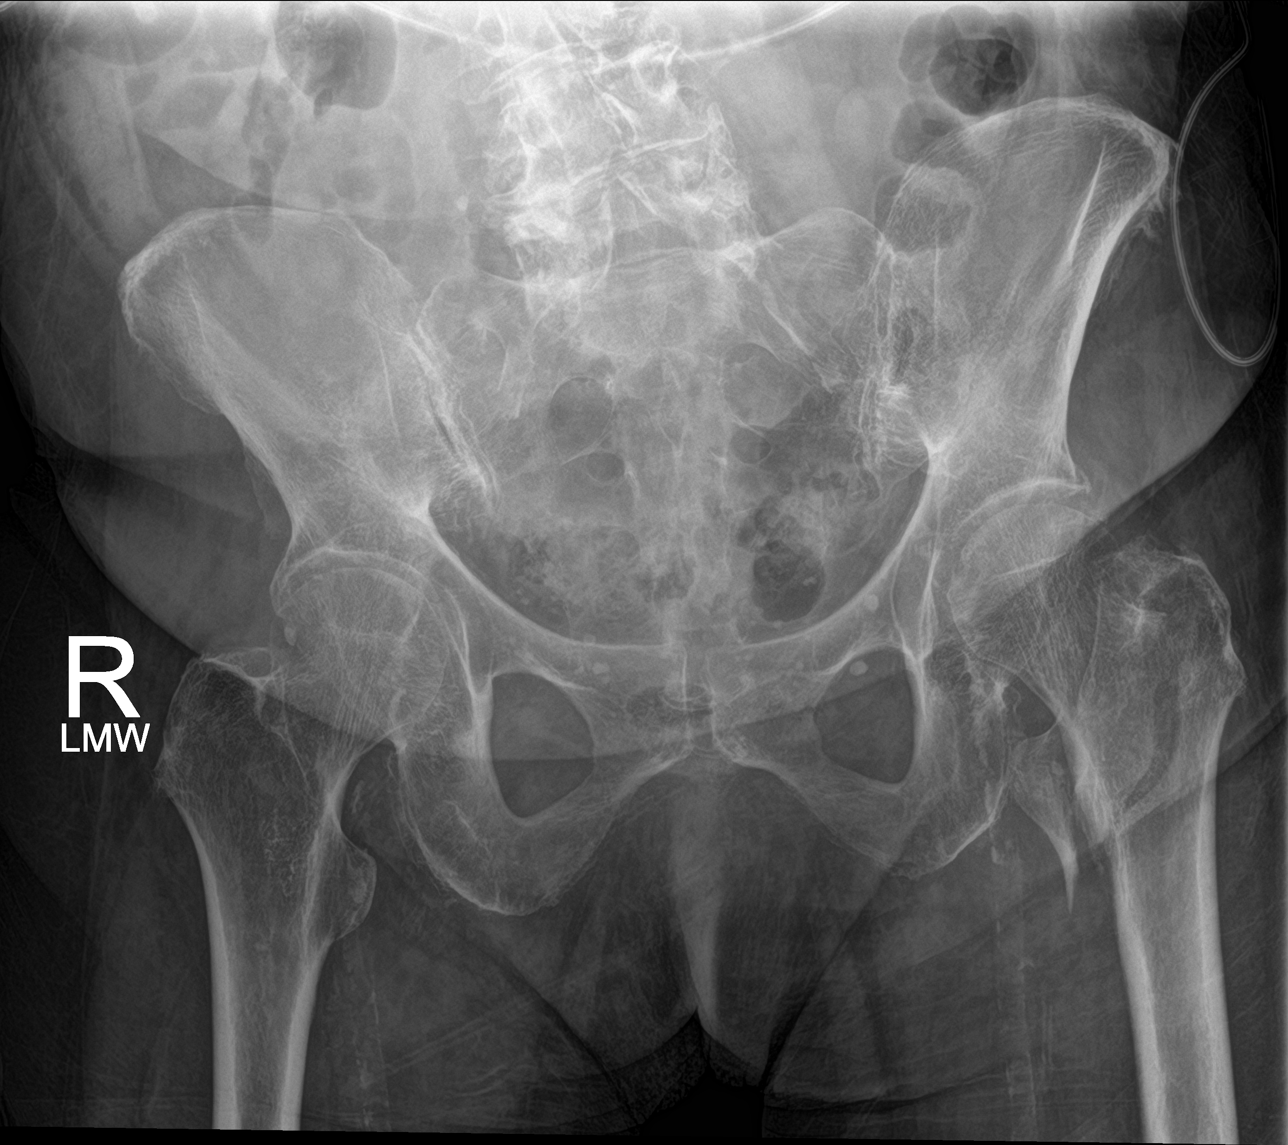

[hip ap]
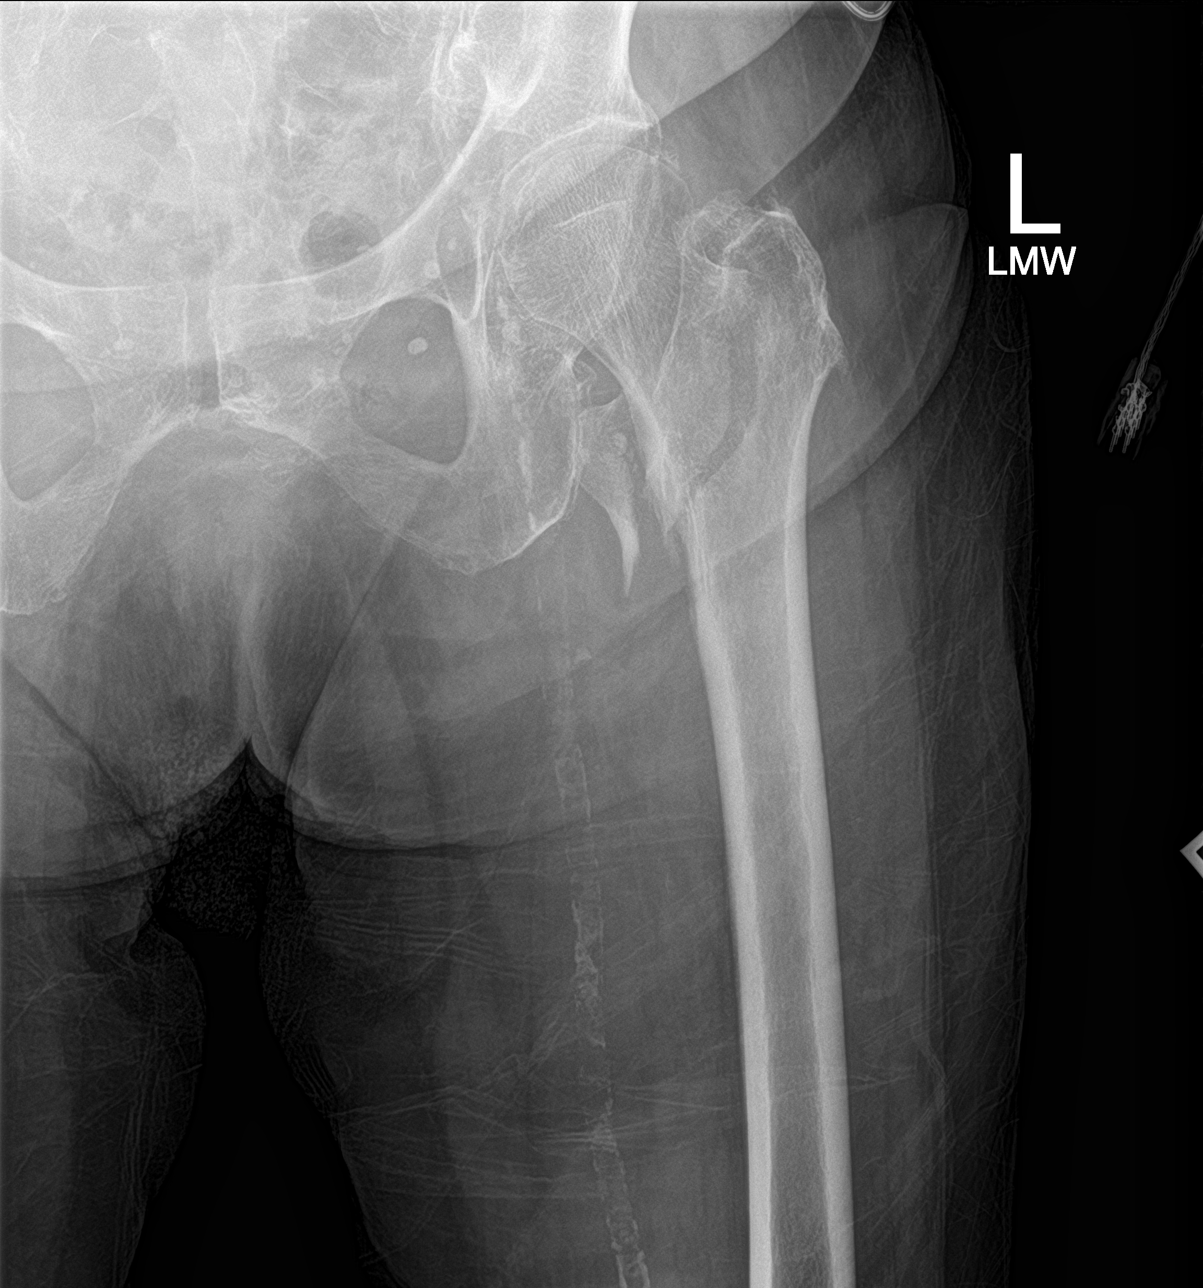

[hip lat]
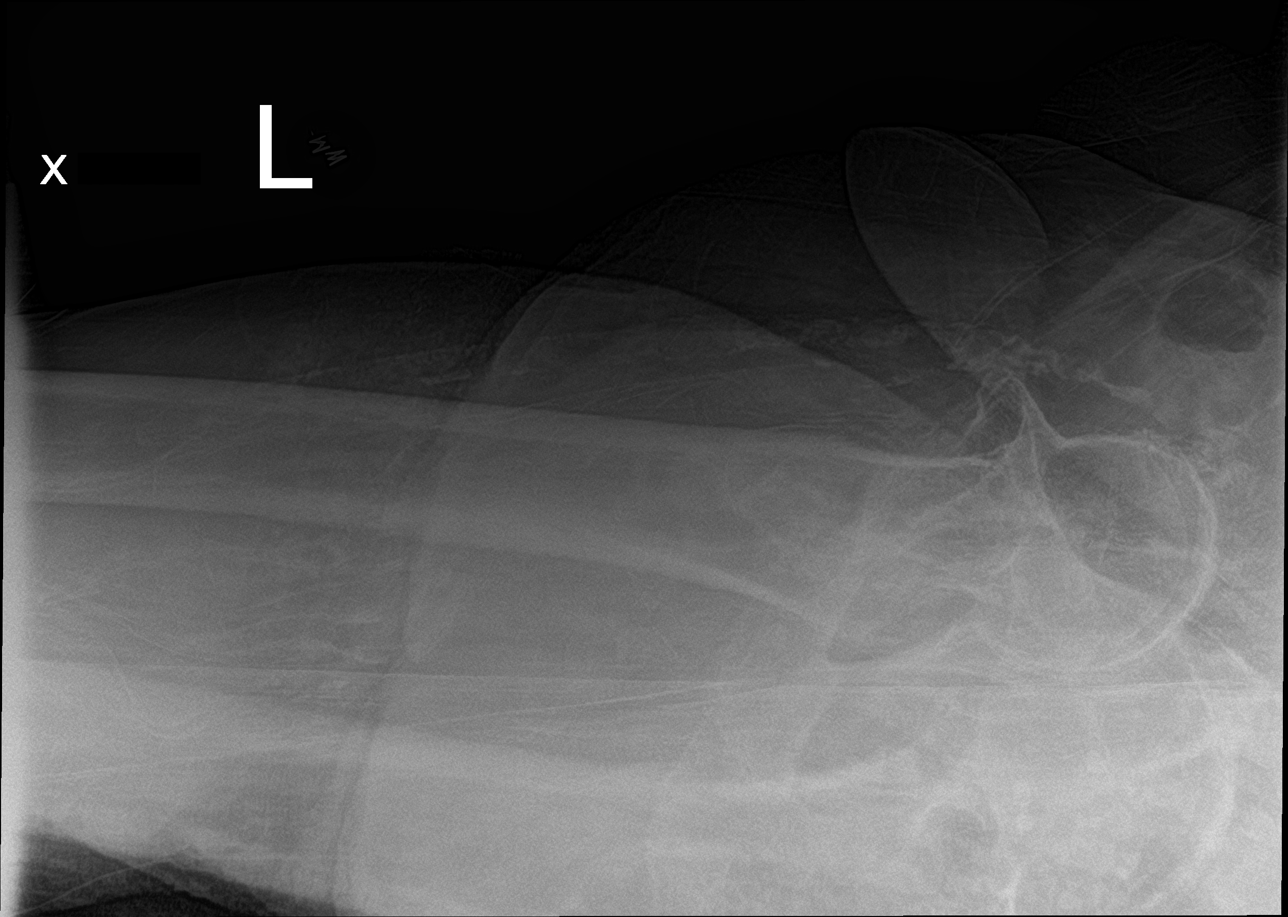

[3 of 3 positions shown; findings below may reference images not displayed]

FINDINGS: Intertrochanteric fracture LEFT hip, avulsion lesser trochanter.
Roughly 1 cm distraction of the fracture fragments. Soft tissue
swelling. Vascular calcification. No pelvic fracture.
IMPRESSION: Intertrochanteric fracture LEFT hip.

## 2020-06-12 IMAGING — CR CHEST  1 VIEW
1 series · 1 of 1 positions shown · non-contrast
Comparison: None.

CLINICAL DATA: LEFT hip fracture.  Preoperative assessment.

EXAM:
CHEST  1 VIEW

[chest pa]
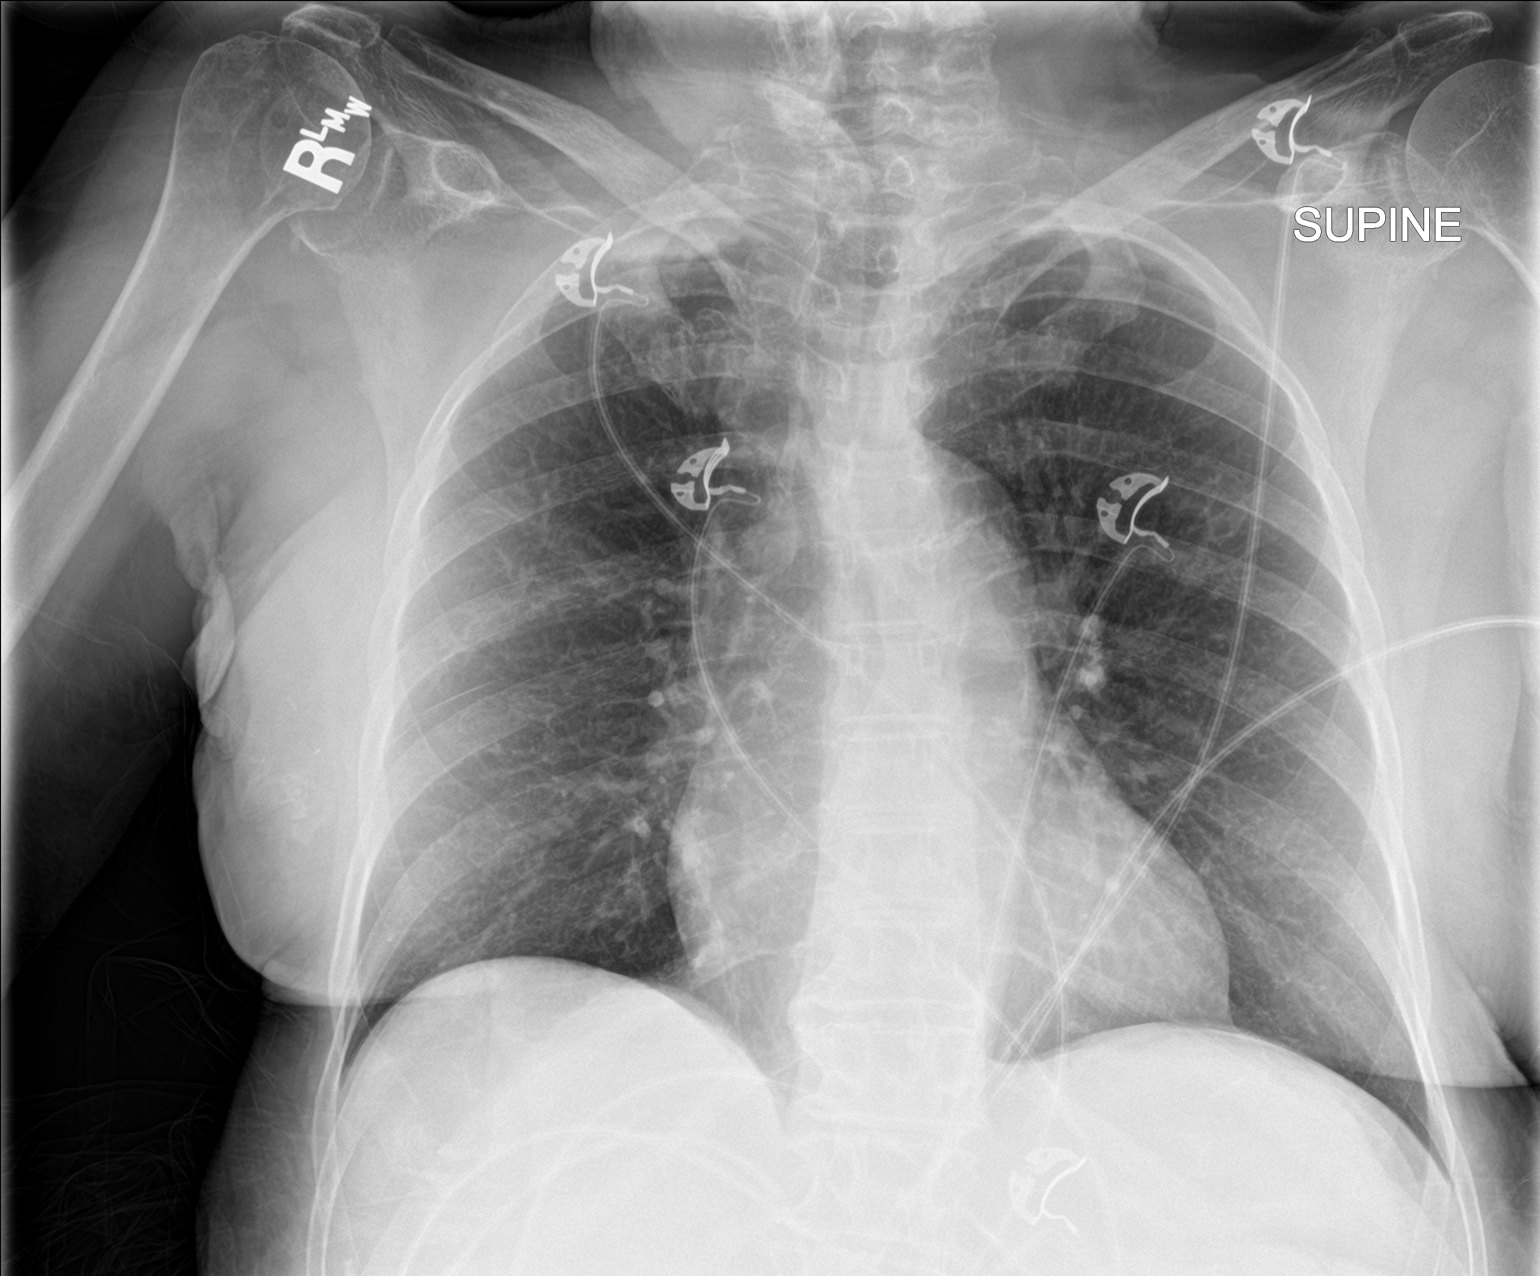

[1 of 1 positions shown; findings below may reference images not displayed]

FINDINGS: The heart size and mediastinal contours are within normal limits.
Both lungs are clear. The visualized skeletal structures are
unremarkable.
IMPRESSION: No active disease.

## 2020-06-15 IMAGING — DX PORTABLE CHEST - 1 VIEW
1 series · 1 of 1 positions shown · non-contrast
Comparison: Chest x-ray 07/07/2018.

CLINICAL DATA: [AGE] female with history of hypoxia.

EXAM:
PORTABLE CHEST 1 VIEW

[chest ap]
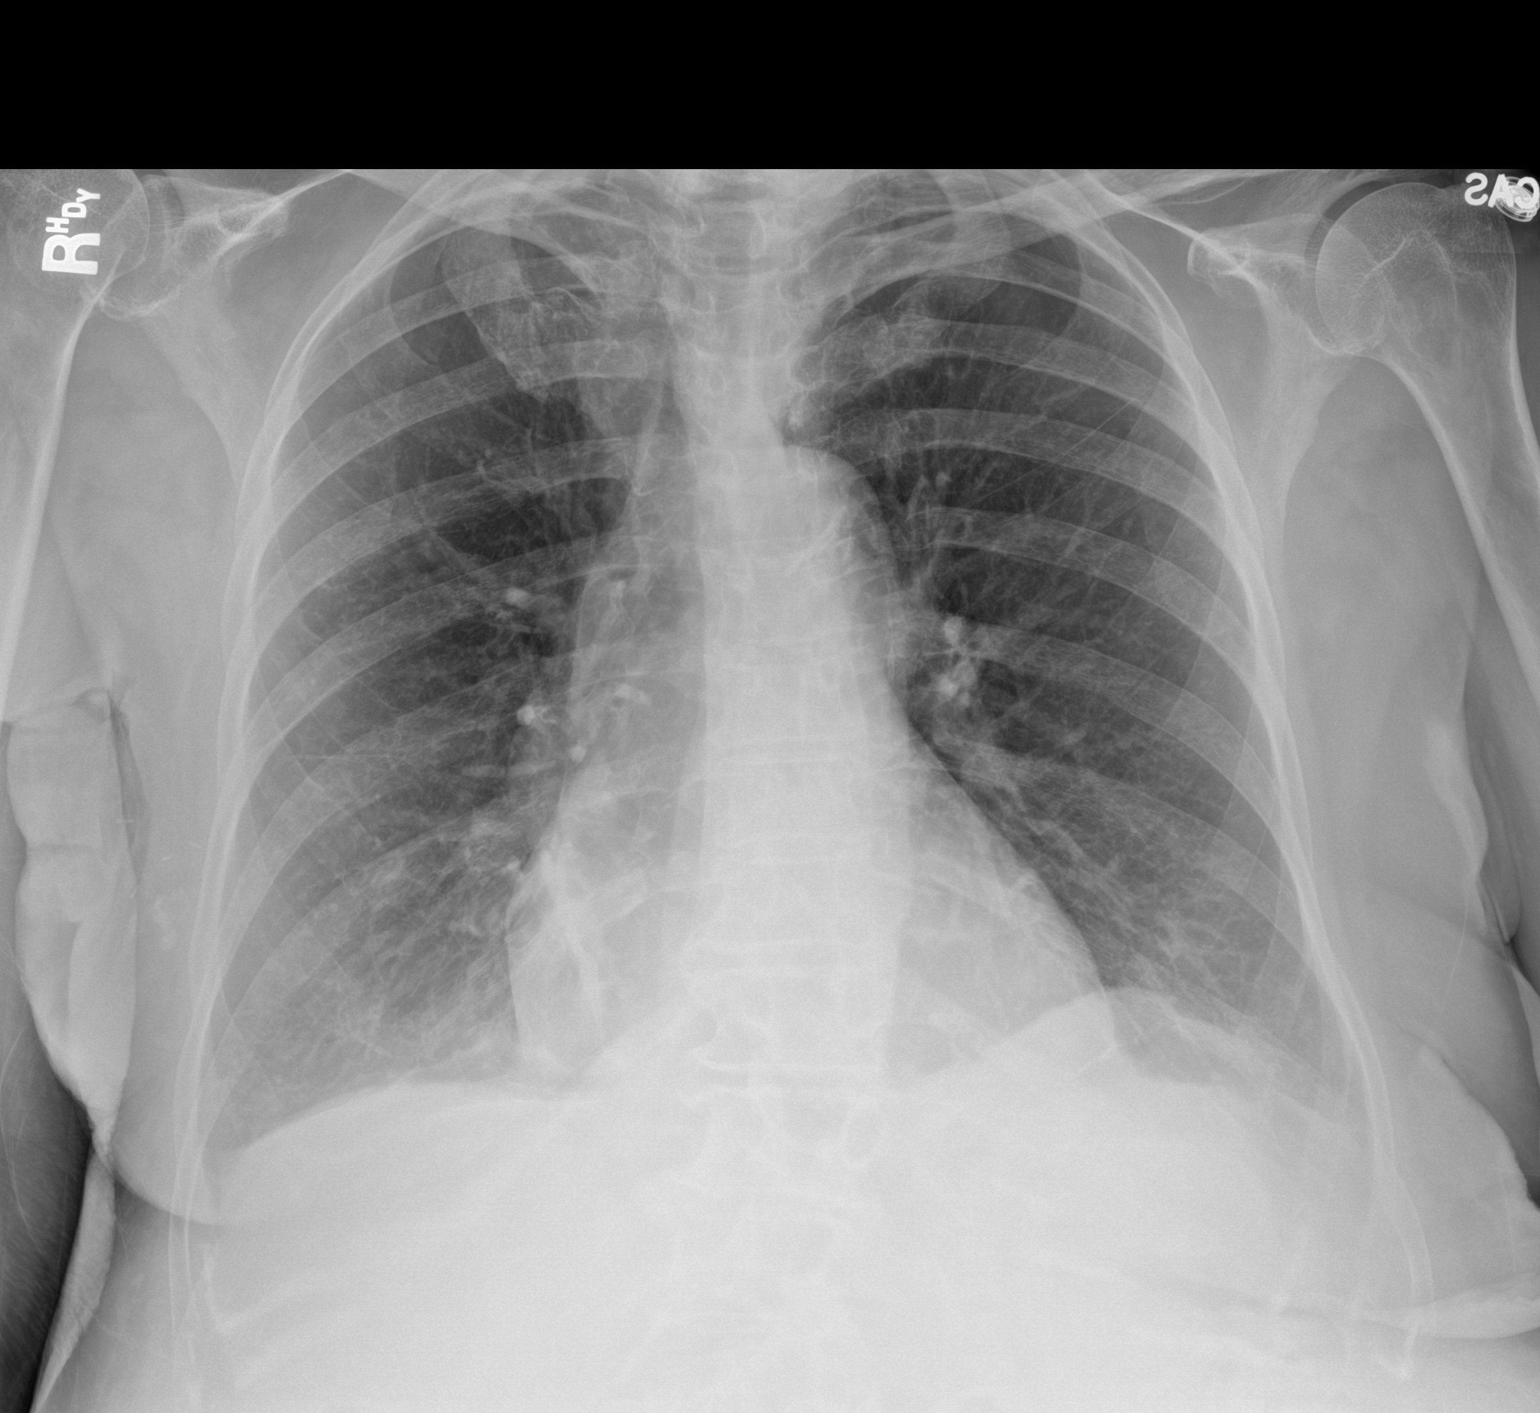

[1 of 1 positions shown; findings below may reference images not displayed]

FINDINGS: Lung volumes are normal. No consolidative airspace disease. No
pleural effusions. No pneumothorax. No pulmonary nodule or mass
noted. Pulmonary vasculature and the cardiomediastinal silhouette
are within normal limits. Atherosclerosis in the thoracic aorta.
IMPRESSION: 1.  No radiographic evidence of acute cardiopulmonary disease.
2. Aortic atherosclerosis.

## 2020-10-16 ENCOUNTER — Encounter: Payer: Self-pay | Admitting: Radiology

## 2020-10-16 ENCOUNTER — Emergency Department
Admission: EM | Admit: 2020-10-16 | Discharge: 2020-10-16 | Disposition: A | Discharge status: Home / Self Care | Payer: Medicare (Managed Care) | Attending: Emergency Medicine | Admitting: Emergency Medicine

## 2020-10-16 ENCOUNTER — Emergency Department: Payer: Medicare (Managed Care)

## 2020-10-16 ENCOUNTER — Other Ambulatory Visit: Payer: Self-pay

## 2020-10-16 DIAGNOSIS — Z79899 Other long term (current) drug therapy: Secondary | ICD-10-CM | POA: Insufficient documentation

## 2020-10-16 DIAGNOSIS — Z7984 Long term (current) use of oral hypoglycemic drugs: Secondary | ICD-10-CM | POA: Insufficient documentation

## 2020-10-16 DIAGNOSIS — N3000 Acute cystitis without hematuria: Secondary | ICD-10-CM | POA: Diagnosis not present

## 2020-10-16 DIAGNOSIS — I10 Essential (primary) hypertension: Secondary | ICD-10-CM | POA: Insufficient documentation

## 2020-10-16 DIAGNOSIS — Z853 Personal history of malignant neoplasm of breast: Secondary | ICD-10-CM | POA: Insufficient documentation

## 2020-10-16 DIAGNOSIS — E119 Type 2 diabetes mellitus without complications: Secondary | ICD-10-CM | POA: Diagnosis not present

## 2020-10-16 DIAGNOSIS — R1030 Lower abdominal pain, unspecified: Secondary | ICD-10-CM | POA: Diagnosis present

## 2020-10-16 DIAGNOSIS — R102 Pelvic and perineal pain: Secondary | ICD-10-CM

## 2020-10-16 LAB — URINALYSIS, COMPLETE (UACMP) WITH MICROSCOPIC
Bilirubin Urine: NEGATIVE
Glucose, UA: NEGATIVE mg/dL
Ketones, ur: NEGATIVE mg/dL
Nitrite: NEGATIVE
Protein, ur: 30 mg/dL — AB
Specific Gravity, Urine: 1.006 (ref 1.005–1.030)
pH: 8 (ref 5.0–8.0)

## 2020-10-16 LAB — CBC WITH DIFFERENTIAL/PLATELET
Abs Immature Granulocytes: 0.02 10*3/uL (ref 0.00–0.07)
Basophils Absolute: 0 10*3/uL (ref 0.0–0.1)
Basophils Relative: 0 %
Eosinophils Absolute: 0.1 10*3/uL (ref 0.0–0.5)
Eosinophils Relative: 2 %
HCT: 38.1 % (ref 36.0–46.0)
Hemoglobin: 12.7 g/dL (ref 12.0–15.0)
Immature Granulocytes: 0 %
Lymphocytes Relative: 28 %
Lymphs Abs: 1.9 10*3/uL (ref 0.7–4.0)
MCH: 28.2 pg (ref 26.0–34.0)
MCHC: 33.3 g/dL (ref 30.0–36.0)
MCV: 84.5 fL (ref 80.0–100.0)
Monocytes Absolute: 0.6 10*3/uL (ref 0.1–1.0)
Monocytes Relative: 9 %
Neutro Abs: 4 10*3/uL (ref 1.7–7.7)
Neutrophils Relative %: 61 %
Platelets: 240 10*3/uL (ref 150–400)
RBC: 4.51 MIL/uL (ref 3.87–5.11)
RDW: 13.2 % (ref 11.5–15.5)
WBC: 6.7 10*3/uL (ref 4.0–10.5)
nRBC: 0 % (ref 0.0–0.2)

## 2020-10-16 LAB — COMPREHENSIVE METABOLIC PANEL
ALT: 10 U/L (ref 0–44)
AST: 19 U/L (ref 15–41)
Albumin: 3.3 g/dL — ABNORMAL LOW (ref 3.5–5.0)
Alkaline Phosphatase: 70 U/L (ref 38–126)
Anion gap: 9 (ref 5–15)
BUN: 20 mg/dL (ref 8–23)
CO2: 30 mmol/L (ref 22–32)
Calcium: 8.8 mg/dL — ABNORMAL LOW (ref 8.9–10.3)
Chloride: 99 mmol/L (ref 98–111)
Creatinine, Ser: 1.23 mg/dL — ABNORMAL HIGH (ref 0.44–1.00)
GFR, Estimated: 41 mL/min — ABNORMAL LOW (ref >60)
Glucose, Bld: 173 mg/dL — ABNORMAL HIGH (ref 70–99)
Potassium: 3.4 mmol/L — ABNORMAL LOW (ref 3.5–5.1)
Sodium: 138 mmol/L (ref 135–145)
Total Bilirubin: 0.7 mg/dL (ref 0.3–1.2)
Total Protein: 6.4 g/dL — ABNORMAL LOW (ref 6.5–8.1)

## 2020-10-16 LAB — LIPASE, BLOOD: Lipase: 73 U/L — ABNORMAL HIGH (ref 11–51)

## 2020-10-16 MED ORDER — CEPHALEXIN 500 MG PO CAPS: 500.0000 mg | ORAL_CAPSULE | Freq: Two times a day (BID) | ORAL | 0 refills | Status: AC

## 2020-10-16 MED ORDER — LACTATED RINGERS IV BOLUS
1000.0000 mL | Freq: Once | INTRAVENOUS | Status: AC
  Administered 2020-10-16: 1000 mL via INTRAVENOUS

## 2020-10-16 MED ORDER — IOHEXOL 350 MG/ML SOLN
50.0000 mL | Freq: Once | INTRAVENOUS | Status: AC | PRN
  Administered 2020-10-16: 50 mL via INTRAVENOUS

## 2020-10-16 MED ORDER — SODIUM CHLORIDE 0.9 % IV SOLN
1.0000 g | Freq: Once | INTRAVENOUS | Status: AC
  Administered 2020-10-16: 1 g via INTRAVENOUS
  Filled 2020-10-16: qty 10

## 2020-10-16 NOTE — ED Notes (Signed)
Patient taken to CT.

## 2020-10-16 NOTE — ED Notes (Signed)
Assisted patient to toilet.  Urine specimen collected and sent to lab.

## 2020-10-16 NOTE — ED Provider Notes (Signed)
[facility] Emergency Department Provider Note   ____________________________________________   Event Date/Time   First MD Initiated Contact with Patient 10/16/20 [id number]     (approximate)  I have reviewed the triage vital signs and the nursing notes.   HISTORY  Chief Complaint Abdominal Pain    HPI Nancy Zhang is a 87 y.o. female with past medical history of hypertension, hyperlipidemia, and diabetes who presents to the ED complaining of abdominal pain.  Patient reports that she has had approximately 1 week of intermittent pain in her lower abdomen.  She states that it affects her bilateral lower quadrants and has been associated with some dysuria.  She denies any fevers, chills, flank pain, or hematuria.  She states she has been eating and drinking normally with no nausea, vomiting, diarrhea, or constipation.  Pain can come on at any time and is not exacerbated or alleviated by anything in particular.  She denies any history of similar symptoms in the past.  She denies any cough, chest pain, or shortness of breath.        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 female breast ([place]) 12/03/2015   Chronic contact dermatitis 04/24/2013   Diabetes ([place]) 04/24/2013   Diverticulosis 04/24/2013   HTN (hypertension) 04/24/2013   Hypercholesterolemia 04/24/2013   Intention tremor 04/24/2013    Past Surgical History:  Procedure Laterality Date   ABDOMINAL HYSTERECTOMY      APPENDECTOMY     BREAST EXCISIONAL BIOPSY Right 2010   positive   INTRAMEDULLARY (IM) NAIL INTERTROCHANTERIC Left 07/08/2018   Procedure: INTRAMEDULLARY (IM) NAIL INTERTROCHANTRIC;  Surgeon: Leim Fabry, MD;  Location: [facility] ORS;  Service: Orthopedics;  Laterality: Left;    Prior to Admission medications   Medication Sig Start Date End Date Taking? Authorizing Provider  amLODipine (NORVASC) 5 MG tablet Take 5 mg by mouth daily. 10/14/20  Yes [provider]  Calcium Carbonate-Vitamin D3 600-400 MG-UNIT TABS Take 1 tablet by mouth daily as needed. 10/14/20  Yes [provider]  cephALEXin (KEFLEX) 500 MG capsule Take 1 capsule (500 mg total) by mouth 2 (two) times daily for 7 days. 10/16/20 10/23/20 Yes Blake Divine, MD  hydrochlorothiazide (HYDRODIURIL) 12.5 MG tablet Take 12.5 mg by mouth daily. 10/14/20  Yes [provider]  quinapril (ACCUPRIL) 40 MG tablet Take 40 mg by mouth daily. 10/14/20  Yes [provider]  acetaminophen (TYLENOL) 325 MG tablet Take 2 tablets (650 mg total) by mouth every 6 (six) hours as needed for mild pain, fever or headache. 07/11/18   Loletha Grayer, MD  amLODipine (NORVASC) 2.5 MG tablet Take 1 tablet (2.5 mg total) by mouth daily. Patient not taking: Reported on 10/16/2020 07/12/18   Loletha Grayer, MD  atorvastatin (LIPITOR) 10 MG tablet Take 10 mg by mouth daily at 6 PM.  Patient not taking: Reported on 10/16/2020    [provider]  docusate sodium (COLACE)  100 MG capsule Take 1 capsule (100 mg total) by mouth 2 (two) times daily. 07/11/18   Loletha Grayer, MD  enoxaparin (LOVENOX) 40 MG/0.4ML injection Inject 0.4 mLs (40 mg total) into the skin daily. Patient not taking: Reported on 10/16/2020 07/10/18   Reche Dixon, PA-C  metFORMIN (GLUCOPHAGE-XR) 500 MG 24 hr tablet Take 1 tablet by mouth 2 (two) times a day. Patient not taking: Reported on 10/16/2020 07/26/18   [provider]  oxyCODONE (OXY IR/ROXICODONE) 5 MG  immediate release tablet Take 0.5-1 tablets (2.5-5 mg total) by mouth every 4 (four) hours as needed for moderate pain or severe pain (pain score 4-6). Patient not taking: Reported on 10/16/2020 07/10/18   Reche Dixon, PA-C  quinapril (ACCUPRIL) 10 MG tablet Take 1 tablet (10 mg total) by mouth daily. Patient not taking: Reported on 10/16/2020 07/12/18   Loletha Grayer, MD  traMADol (ULTRAM) 50 MG tablet Take 1 tablet (50 mg total) by mouth every 6 (six) hours as needed for moderate pain. Patient not taking: Reported on 10/16/2020 07/10/18   Reche Dixon, PA-C    Allergies Shellfish allergy  Family History  Problem Relation Age of Onset   Breast cancer Daughter 70    Social History Social History   Tobacco Use   Smoking status: Never   Smokeless tobacco: Never  Substance Use Topics   Alcohol use: No   Drug use: No    Review of Systems  Constitutional: No fever/chills Eyes: No visual changes. ENT: No sore throat. Cardiovascular: Denies chest pain. Respiratory: Denies shortness of breath. Gastrointestinal: Positive for abdominal pain.  No nausea, no vomiting.  No diarrhea.  No constipation. Genitourinary: Positive for dysuria. Musculoskeletal: Negative for back pain. Skin: Negative for rash. Neurological: Negative for headaches, focal weakness or numbness.  ____________________________________________   PHYSICAL EXAM:  VITAL SIGNS: ED Triage Vitals  Enc Vitals Group     BP      Pulse      Resp      Temp      Temp src      SpO2      Weight      Height      Head Circumference      Peak Flow      Pain Score      Pain Loc      Pain Edu?      Excl. in [place]?     Constitutional: Alert and oriented. Eyes: Conjunctivae are normal. Head: Atraumatic. Nose: No congestion/rhinnorhea. Mouth/Throat: Mucous membranes are moist. Neck: Normal ROM Cardiovascular: Normal rate, regular rhythm. Grossly normal heart sounds.  2+ radial pulses bilaterally. Respiratory: Normal  respiratory effort.  No retractions. Lungs CTAB. Gastrointestinal: Soft and tender to palpation in the bilateral lower quadrants with no rebound or guarding. No distention. Genitourinary: deferred Musculoskeletal: No lower extremity tenderness nor edema. Neurologic:  Normal speech and language. No gross focal neurologic deficits are appreciated. Skin:  Skin is warm, dry and intact. No rash noted. Psychiatric: Mood and affect are normal. Speech and behavior are normal.  ____________________________________________   LABS (all labs ordered are listed, but only abnormal results are displayed)  Labs Reviewed  COMPREHENSIVE METABOLIC PANEL - Abnormal; Notable for the following components:      Result Value   Potassium 3.4 (*)    Glucose, Bld 173 (*)    Creatinine, Ser 1.23 (*)    Calcium 8.8 (*)    Total Protein 6.4 (*)    Albumin 3.3 (*)  GFR, Estimated 41 (*)    All other components within normal limits  LIPASE, BLOOD - Abnormal; Notable for the following components:   Lipase 73 (*)    All other components within normal limits  URINALYSIS, COMPLETE (UACMP) WITH MICROSCOPIC - Abnormal; Notable for the following components:   Color, Urine YELLOW (*)    APPearance HAZY (*)    Hgb urine dipstick SMALL (*)    Protein, ur 30 (*)    Leukocytes,Ua TRACE (*)    Bacteria, UA RARE (*)    All other components within normal limits  URINE CULTURE  CBC WITH DIFFERENTIAL/PLATELET   ____________________________________________  EKG  ED ECG REPORT I, Blake Divine, the attending physician, personally viewed and interpreted this ECG.   Date: 10/16/2020  EKG Time: 8:49  Rate: 67  Rhythm: normal sinus rhythm  Axis: LAD  Intervals:none  ST&T Change: None   PROCEDURES  Procedure(s) performed (including Critical Care):  Procedures   ____________________________________________   INITIAL IMPRESSION / ASSESSMENT AND PLAN / ED COURSE      87 year old female with past medical  history of hypertension, hyperlipidemia, and diabetes who presents to the ED for intermittent bilateral lower quadrant abdominal pain for about the past week.  Patient is overall well-appearing but does have tenderness to bilateral lower quadrants on exam.  Symptoms could be related to UTI given her dysuria, however we will further assess for alternative pathology with CT scan given her advanced age.  We will screen EKG and labs.  Patient states pain is improving at this time and declines any pain medication.  Labs are reassuring, UA does appear consistent with UTI.  CT scan is negative for acute process and etiology of patient suprapubic pain does seem to be a UTI.  Urine was sent for culture and patient was given an IV dose of Rocephin.  Patient states she is feeling well at this time and is requesting to continue treatment at home.  Daughter at bedside agrees with plan for outpatient management and this is reasonable given her stable vitals and otherwise reassuring work-up.  [facility] and [facility] will [facility] prescription for Keflex to their pharmacy, previous cultures have grown pansensitive E. coli.  Patient and daughter counseled to return to the ED for new worsening symptoms, patient agrees with plan.      ____________________________________________   FINAL CLINICAL IMPRESSION(S) / ED DIAGNOSES  Final diagnoses:  Acute cystitis without hematuria  Suprapubic pain     ED Discharge Orders          Ordered    cephALEXin (KEFLEX) 500 MG capsule  2 times daily        10/16/20 1334             Note:  This document was prepared using Dragon voice recognition software and may include unintentional dictation errors.    Blake Divine, MD 10/16/20 [phone]

## 2020-10-16 NOTE — ED Notes (Signed)
Food and fluids provided for patient.  

## 2020-10-18 LAB — URINE CULTURE: Culture: >=100000 — AB

## 2021-08-30 ENCOUNTER — Encounter: Payer: Self-pay | Admitting: Emergency Medicine

## 2021-08-30 ENCOUNTER — Other Ambulatory Visit: Payer: Self-pay

## 2021-08-30 DIAGNOSIS — Z9011 Acquired absence of right breast and nipple: Secondary | ICD-10-CM | POA: Insufficient documentation

## 2021-08-30 DIAGNOSIS — I493 Ventricular premature depolarization: Secondary | ICD-10-CM | POA: Diagnosis not present

## 2021-08-30 DIAGNOSIS — Z79899 Other long term (current) drug therapy: Secondary | ICD-10-CM | POA: Insufficient documentation

## 2021-08-30 DIAGNOSIS — R0789 Other chest pain: Secondary | ICD-10-CM | POA: Diagnosis present

## 2021-08-30 DIAGNOSIS — R0602 Shortness of breath: Secondary | ICD-10-CM | POA: Diagnosis not present

## 2021-08-30 DIAGNOSIS — E119 Type 2 diabetes mellitus without complications: Secondary | ICD-10-CM | POA: Insufficient documentation

## 2021-08-30 DIAGNOSIS — Z7984 Long term (current) use of oral hypoglycemic drugs: Secondary | ICD-10-CM | POA: Insufficient documentation

## 2021-08-30 DIAGNOSIS — R296 Repeated falls: Secondary | ICD-10-CM | POA: Diagnosis present

## 2021-08-30 DIAGNOSIS — W19XXXA Unspecified fall, initial encounter: Secondary | ICD-10-CM | POA: Diagnosis not present

## 2021-08-30 DIAGNOSIS — E876 Hypokalemia: Secondary | ICD-10-CM | POA: Diagnosis not present

## 2021-08-30 DIAGNOSIS — Z853 Personal history of malignant neoplasm of breast: Secondary | ICD-10-CM | POA: Diagnosis not present

## 2021-08-30 DIAGNOSIS — I10 Essential (primary) hypertension: Secondary | ICD-10-CM | POA: Diagnosis not present

## 2021-08-30 NOTE — ED Triage Notes (Signed)
Pt c/o generalized chest pain. Pt states started earlier today. Pt c/o R hand swelling. Pt Alert, NAD noted in triage.

## 2021-08-30 NOTE — ED Triage Notes (Signed)
FIRST NURSE NOTE:  Pt arrived via [facility] from home with reports of SOB and stinging in the chest, pt has hx of DM,  Ekg NSR with PVCs, c/o feeling weak  141/71 94% RA p-61  CBG 125  R hand swelling also noted by EMS for several weeks.

## 2021-08-31 ENCOUNTER — Emergency Department
Admission: EM | Admit: 2021-08-31 | Discharge: 2021-08-31 | Disposition: A | Discharge status: Home / Self Care | Payer: Medicare (Managed Care) | Attending: Emergency Medicine | Admitting: Emergency Medicine

## 2021-08-31 ENCOUNTER — Emergency Department: Payer: Medicare (Managed Care)

## 2021-08-31 ENCOUNTER — Other Ambulatory Visit: Payer: Medicare (Managed Care)

## 2021-08-31 DIAGNOSIS — I493 Ventricular premature depolarization: Secondary | ICD-10-CM

## 2021-08-31 DIAGNOSIS — R296 Repeated falls: Secondary | ICD-10-CM

## 2021-08-31 DIAGNOSIS — E876 Hypokalemia: Secondary | ICD-10-CM

## 2021-08-31 DIAGNOSIS — R0789 Other chest pain: Secondary | ICD-10-CM

## 2021-08-31 LAB — URINALYSIS, ROUTINE W REFLEX MICROSCOPIC
Bilirubin Urine: NEGATIVE
Glucose, UA: 50 mg/dL — AB
Ketones, ur: NEGATIVE mg/dL
Leukocytes,Ua: NEGATIVE
Nitrite: NEGATIVE
Protein, ur: 30 mg/dL — AB
Specific Gravity, Urine: 1.005 (ref 1.005–1.030)
pH: 6 (ref 5.0–8.0)

## 2021-08-31 LAB — CBC
HCT: 41.4 % (ref 36.0–46.0)
Hemoglobin: 13 g/dL (ref 12.0–15.0)
MCH: 27.7 pg (ref 26.0–34.0)
MCHC: 31.4 g/dL (ref 30.0–36.0)
MCV: 88.3 fL (ref 80.0–100.0)
Platelets: 193 10*3/uL (ref 150–400)
RBC: 4.69 MIL/uL (ref 3.87–5.11)
RDW: 13.9 % (ref 11.5–15.5)
WBC: 7.5 10*3/uL (ref 4.0–10.5)
nRBC: 0 % (ref 0.0–0.2)

## 2021-08-31 LAB — BASIC METABOLIC PANEL
Anion gap: 12 (ref 5–15)
BUN: 23 mg/dL (ref 8–23)
CO2: 26 mmol/L (ref 22–32)
Calcium: 8.9 mg/dL (ref 8.9–10.3)
Chloride: 98 mmol/L (ref 98–111)
Creatinine, Ser: 0.97 mg/dL (ref 0.44–1.00)
GFR, Estimated: 54 mL/min — ABNORMAL LOW (ref >60)
Glucose, Bld: 125 mg/dL — ABNORMAL HIGH (ref 70–99)
Potassium: 3.3 mmol/L — ABNORMAL LOW (ref 3.5–5.1)
Sodium: 136 mmol/L (ref 135–145)

## 2021-08-31 LAB — TROPONIN I (HIGH SENSITIVITY)
Troponin I (High Sensitivity): 22 ng/L — ABNORMAL HIGH (ref <18)
Troponin I (High Sensitivity): 23 ng/L — ABNORMAL HIGH (ref <18)

## 2021-08-31 LAB — MAGNESIUM: Magnesium: 1.4 mg/dL — ABNORMAL LOW (ref 1.7–2.4)

## 2021-08-31 MED ORDER — POTASSIUM CHLORIDE CRYS ER 20 MEQ PO TBCR
40.0000 meq | EXTENDED_RELEASE_TABLET | Freq: Once | ORAL | Status: AC
  Administered 2021-08-31: 40 meq via ORAL
  Filled 2021-08-31: qty 2

## 2021-08-31 MED ORDER — MAGNESIUM SULFATE 2 GM/50ML IV SOLN
2.0000 g | Freq: Once | INTRAVENOUS | Status: AC
  Administered 2021-08-31: 2 g via INTRAVENOUS
  Filled 2021-08-31: qty 50

## 2021-08-31 NOTE — ED Notes (Signed)
Pt is complaining of foot cramps and tried to climb out of bed. Pt was told she had to remain in the bed. Pt's family was told not to let her out of the bed. Pt had to moved up in bed three times. Hat placed in toilet to attempt to get urine. Pt is able to urinate as she did so in triage.

## 2021-08-31 NOTE — ED Notes (Signed)
Pt ambulated to toilet with x1 assistance

## 2021-08-31 NOTE — ED Provider Notes (Signed)
[facility] Provider Note    Event Date/Time   First MD Initiated Contact with Patient 08/31/21 [phone]     (approximate)   History   Chest Pain   HPI  Nancy Zhang is a 88 y.o. female with history of hypertension, diabetes, hyperlipidemia, breast cancer status postlumpectomy who presents to the emergency department EMS after her life alert went off.  Patient told family that she fell but cannot tell us why she fell.  She told family that she had to crawl to the door.  Told EMS that she was having "stinging chest pain" and shortness of breath.  She denies this currently.  No known fevers, cough, vomiting or diarrhea.  She denies any pain.  She is unsure if she hit her head.  She is not on blood thinners.  Family reports that she lives alone.   History provided by patient, EMS and family.    Past Medical History:  Diagnosis Date   Breast cancer ([place]) 2010   RT LUMPECTOMY   Diabetes ([place])    Diverticulosis    Hypercholesterolemia    Hypertension    Intention tremor    Personal history of chemotherapy 2010   BREAST CA   Personal history of radiation therapy 2010   BREAST CA    Past Surgical History:  Procedure Laterality Date   ABDOMINAL HYSTERECTOMY     APPENDECTOMY     BREAST EXCISIONAL BIOPSY Right 2010   positive   INTRAMEDULLARY (IM) NAIL INTERTROCHANTERIC Left 07/08/2018   Procedure: INTRAMEDULLARY (IM) NAIL INTERTROCHANTRIC;  Surgeon: Leim Fabry, MD;  Location: [facility] ORS;  Service: Orthopedics;  Laterality: Left;    MEDICATIONS:  Prior to Admission medications   Medication Sig Start Date End Date Taking? Authorizing Provider  acetaminophen (TYLENOL) 325 MG tablet Take 2 tablets (650 mg total) by mouth every 6 (six) hours as needed for mild pain, fever or headache. 07/11/18   Loletha Grayer, MD  amLODipine (NORVASC) 2.5 MG tablet Take 1 tablet (2.5 mg total) by mouth daily. Patient not taking: Reported on 10/16/2020 07/12/18   Loletha Grayer, MD  amLODipine (NORVASC) 5 MG tablet Take 5 mg by mouth daily. 10/14/20   [provider]  atorvastatin (LIPITOR) 10 MG tablet Take 10 mg by mouth daily at 6 PM.  Patient not taking: Reported on 10/16/2020    [provider]  Calcium Carbonate-Vitamin D3 600-400 MG-UNIT TABS Take 1 tablet by mouth daily as needed. 10/14/20   [provider]  docusate sodium (COLACE) 100 MG capsule Take 1 capsule (100 mg total) by mouth 2 (two) times daily. 07/11/18   Loletha Grayer, MD  enoxaparin (LOVENOX) 40 MG/0.4ML injection Inject 0.4 mLs (40 mg total) into the skin daily. Patient not taking: Reported on 10/16/2020 07/10/18   Reche Dixon, PA-C  hydrochlorothiazide (HYDRODIURIL) 12.5 MG tablet Take 12.5 mg by mouth daily. 10/14/20   [provider]  metFORMIN (GLUCOPHAGE-XR) 500 MG 24 hr tablet Take 1 tablet by mouth 2 (two) times a day. Patient not taking: Reported on 10/16/2020 07/26/18   [provider]  oxyCODONE (OXY IR/ROXICODONE) 5 MG immediate release tablet Take 0.5-1 tablets (2.5-5 mg total) by mouth every 4 (four) hours as needed for moderate pain or severe pain (pain score 4-6). Patient not taking: Reported on 10/16/2020 07/10/18   Reche Dixon, PA-C  quinapril (ACCUPRIL) 10 MG tablet Take 1 tablet (10 mg total) by mouth daily. Patient not taking: Reported on 10/16/2020 07/12/18  Loletha Grayer, MD  quinapril (ACCUPRIL) 40 MG tablet Take 40 mg by mouth daily. 10/14/20   [provider]  traMADol (ULTRAM) 50 MG tablet Take 1 tablet (50 mg total) by mouth every 6 (six) hours as needed for moderate pain. Patient not taking: Reported on 10/16/2020 07/10/18   Reche Dixon, PA-C    Physical Exam   Triage Vital Signs: ED Triage Vitals  Enc Vitals Group     BP 08/30/21 2347 (!) 141/56     Pulse Rate 08/30/21 2347 64     Resp 08/30/21 2347 16     Temp 08/30/21 2347 98.4 F (36.9 C)     Temp Source 08/30/21 2347 Oral     SpO2 08/30/21 2347 94 %     Weight  08/30/21 2353 134 lb 14.7 oz (61.2 kg)     Height 08/30/21 2353 '5\' 3"'$  (1.6 m)     Head Circumference --      Peak Flow --      Pain Score 08/30/21 2353 5     Pain Loc --      Pain Edu? --      Excl. in [place]? --     Most recent vital signs: Vitals:   08/31/21 0354 08/31/21 0600  BP: (!) 124/56 (!) 109/59  Pulse: (!) 55 68  Resp: 18 20  Temp:    SpO2: 91%      CONSTITUTIONAL: Alert and oriented to person and place but not year or situation.  Elderly.  In no distress. HEAD: Normocephalic; atraumatic EYES: Conjunctivae clear, PERRL, EOMI ENT: normal nose; no rhinorrhea; moist mucous membranes; pharynx without lesions noted; no dental injury; no septal hematoma, no epistaxis; no facial deformity or bony tenderness NECK: Supple, no midline spinal tenderness, step-off or deformity; trachea midline CARD: RRR; S1 and S2 appreciated; no murmurs, no clicks, no rubs, no gallops RESP: Normal chest excursion without splinting or tachypnea; breath sounds clear and equal bilaterally; no wheezes, no rhonchi, no rales; no hypoxia or respiratory distress CHEST:  chest wall stable, no crepitus or ecchymosis or deformity, nontender to palpation; no flail chest ABD/GI: Normal bowel sounds; non-distended; soft, non-tender, no rebound, no guarding; no ecchymosis or other lesions noted PELVIS:  stable, nontender to palpation BACK:  The back appears normal; no midline spinal tenderness, step-off or deformity EXT: Normal ROM in all joints; non-tender to palpation; no edema; normal capillary refill; no cyanosis, no bony tenderness or bony deformity of patient's extremities, no joint effusion, compartments are soft, extremities are warm and well-perfused, no ecchymosis SKIN: Normal color for age and race; warm NEURO: No facial asymmetry, normal speech, moving all extremities equally  ED Results / Procedures / Treatments   LABS: (all labs ordered are listed, but only abnormal results are displayed) Labs  Reviewed  BASIC METABOLIC PANEL - Abnormal; Notable for the following components:      Result Value   Potassium 3.3 (*)    Glucose, Bld 125 (*)    GFR, Estimated 54 (*)    All other components within normal limits  MAGNESIUM - Abnormal; Notable for the following components:   Magnesium 1.4 (*)    All other components within normal limits  TROPONIN I (HIGH SENSITIVITY) - Abnormal; Notable for the following components:   Troponin I (High Sensitivity) 22 (*)    All other components within normal limits  TROPONIN I (HIGH SENSITIVITY) - Abnormal; Notable for the following components:   Troponin I (High Sensitivity) 23 (*)  All other components within normal limits  CBC  URINALYSIS, ROUTINE W REFLEX MICROSCOPIC     EKG:  EKG Interpretation  Date/Time:  Saturday August 30 2021 23:55:03 EDT Ventricular Rate:  64 PR Interval:    QRS Duration: 66 QT Interval:  402 QTC Calculation: 414 R Axis:   11 Text Interpretation: Normal sinus rhythm frequent PVCs Anterior infarct , age undetermined Abnormal ECG When compared with ECG of 20-Aug-2018 07:02, PREVIOUS ECG IS PRESENT Confirmed by Pryor Curia [id number] on 08/31/2021 6:27:23 AM          RADIOLOGY: My personal review and interpretation of imaging: CT head and cervical spine show no traumatic injury.  Chest x-ray clear.  I have personally reviewed all radiology reports. CT HEAD WO CONTRAST (5MM)  Result Date: 08/31/2021 CLINICAL DATA:  88 year old female with history of trauma to the head and neck. EXAM: CT HEAD WITHOUT CONTRAST CT CERVICAL SPINE WITHOUT CONTRAST TECHNIQUE: Multidetector CT imaging of the head and cervical spine was performed following the standard protocol without intravenous contrast. Multiplanar CT image reconstructions of the cervical spine were also generated. RADIATION DOSE REDUCTION: This exam was performed according to the departmental dose-optimization program which includes automated exposure control,  adjustment of the mA and/or kV according to patient size and/or use of iterative reconstruction technique. COMPARISON:  Head CT 01/14/2013. FINDINGS: CT HEAD FINDINGS Brain: Moderate to severe cerebral and mild cerebellar atrophy. Patchy and confluent areas of decreased attenuation are noted throughout the deep and periventricular white matter of the cerebral hemispheres bilaterally, compatible with chronic microvascular ischemic disease. No evidence of acute infarction, hemorrhage, hydrocephalus, extra-axial collection or mass lesion/mass effect. Vascular: No hyperdense vessel or unexpected calcification. Skull: Normal. Negative for fracture or focal lesion. Sinuses/Orbits: No acute finding. Other: None. CT CERVICAL SPINE FINDINGS Alignment: 2 mm of anterolisthesis of C4 upon C5. 2 mm of anterolisthesis of C7 upon T1. Both of these regions are favored to be chronic and degenerative in nature. Alignment is otherwise anatomic. Skull base and vertebrae: No acute fracture. No primary bone lesion or focal pathologic process. Soft tissues and spinal canal: No prevertebral fluid or swelling. No visible canal hematoma. Disc levels: Multilevel degenerative disc disease, most pronounced at C6-C7 and C7-T1. Severe multilevel bilateral facet arthropathy. Upper chest: Mild bilateral apical pleuroparenchymal thickening and architectural distortion, most compatible with areas of chronic post infectious or inflammatory scarring. Other: None. IMPRESSION: 1. No evidence of significant acute traumatic injury to the skull, brain or cervical spine. 2. Moderate to severe cerebral and mild cerebellar atrophy with extensive chronic microvascular ischemic changes in the cerebral white matter, as above. 3. Extensive multilevel degenerative disc disease and cervical spondylosis, as above. Electronically Signed   By: Vinnie Langton M.D.   On: 08/31/2021 06:55   CT Cervical Spine Wo Contrast  Result Date: 08/31/2021 CLINICAL DATA:   88 year old female with history of trauma to the head and neck. EXAM: CT HEAD WITHOUT CONTRAST CT CERVICAL SPINE WITHOUT CONTRAST TECHNIQUE: Multidetector CT imaging of the head and cervical spine was performed following the standard protocol without intravenous contrast. Multiplanar CT image reconstructions of the cervical spine were also generated. RADIATION DOSE REDUCTION: This exam was performed according to the departmental dose-optimization program which includes automated exposure control, adjustment of the mA and/or kV according to patient size and/or use of iterative reconstruction technique. COMPARISON:  Head CT 01/14/2013. FINDINGS: CT HEAD FINDINGS Brain: Moderate to severe cerebral and mild cerebellar atrophy. Patchy and confluent areas of decreased attenuation are  noted throughout the deep and periventricular white matter of the cerebral hemispheres bilaterally, compatible with chronic microvascular ischemic disease. No evidence of acute infarction, hemorrhage, hydrocephalus, extra-axial collection or mass lesion/mass effect. Vascular: No hyperdense vessel or unexpected calcification. Skull: Normal. Negative for fracture or focal lesion. Sinuses/Orbits: No acute finding. Other: None. CT CERVICAL SPINE FINDINGS Alignment: 2 mm of anterolisthesis of C4 upon C5. 2 mm of anterolisthesis of C7 upon T1. Both of these regions are favored to be chronic and degenerative in nature. Alignment is otherwise anatomic. Skull base and vertebrae: No acute fracture. No primary bone lesion or focal pathologic process. Soft tissues and spinal canal: No prevertebral fluid or swelling. No visible canal hematoma. Disc levels: Multilevel degenerative disc disease, most pronounced at C6-C7 and C7-T1. Severe multilevel bilateral facet arthropathy. Upper chest: Mild bilateral apical pleuroparenchymal thickening and architectural distortion, most compatible with areas of chronic post infectious or inflammatory scarring. Other:  None. IMPRESSION: 1. No evidence of significant acute traumatic injury to the skull, brain or cervical spine. 2. Moderate to severe cerebral and mild cerebellar atrophy with extensive chronic microvascular ischemic changes in the cerebral white matter, as above. 3. Extensive multilevel degenerative disc disease and cervical spondylosis, as above. Electronically Signed   By: Vinnie Langton M.D.   On: 08/31/2021 06:55   DG Chest 2 View  Result Date: 08/31/2021 CLINICAL DATA:  Chest pain EXAM: CHEST - 2 VIEW COMPARISON:  07/10/2018 FINDINGS: Bilateral diaphragmatic eventration noted, better seen on CT examination of 10/16/2020. Lungs are clear. No pneumothorax or pleural effusion. Cardiac size within normal limits. Pulmonary vascularity is normal. No acute bone abnormality. IMPRESSION: No evidence of acute cardiopulmonary disease. Electronically Signed   By: Fidela Salisbury M.D.   On: 08/31/2021 00:47     PROCEDURES:  Critical Care performed: NO     .1-3 Lead EKG Interpretation  Performed by: Monty Spicher, Delice Bison, DO Authorized by: Adelai Achey, Delice Bison, DO     Interpretation: normal     ECG rate:  55   ECG rate assessment: bradycardic     Rhythm: sinus bradycardia     Ectopy: PVCs     Conduction: normal       IMPRESSION / MDM / ASSESSMENT AND PLAN / ED COURSE  I reviewed the triage vital signs and the nursing notes.  Patient here with unwitnessed fall from home.  History of dementia so not able to provide much history.  Initially told EMS she was having chest pain and shortness of breath but denies this currently.  The patient is on the cardiac monitor to evaluate for evidence of arrhythmia and/or significant heart rate changes.   DIFFERENTIAL DIAGNOSIS (includes but not limited to):   Fall, concussion, intracranial hemorrhage, skull fracture, cervical spine fracture, anemia, electrolyte derangement, dehydration, UTI, ACS, PE, dissection, pneumonia, pneumothorax  Patient's presentation is  most consistent with acute presentation with potential threat to life or bodily function.  PLAN: We will obtain CBC, BMP, troponin x2.  Will check magnesium level as well given she has frequent PVCs.  EKG shows no new ischemic change.  Will check CT of the head and cervical spine, chest x-ray, urinalysis.   MEDICATIONS GIVEN IN ED: Medications  magnesium sulfate IVPB 2 g 50 mL (2 g Intravenous New Bag/Given 08/31/21 0611)  potassium chloride SA (KLOR-CON M) CR tablet 40 mEq (40 mEq Oral Given 08/31/21 1914)     ED COURSE: Patient's labs show potassium of 3.3 and magnesium of 1.4.  Will give replacement.  Troponin x2 is slightly elevated at 22 and 23 but stable.  No leukocytosis.  Normal hemoglobin.  Chest x-ray reviewed/interpreted by myself and radiologist and shows no infiltrate, edema or pneumothorax.  Urinalysis, CT is pending.   CTs reviewed/interpreted by myself and radiology and show no acute traumatic injury.  Urinalysis pending for further evaluation but anticipate discharge home.  Family currently at bedside.  Signed out the oncoming ED physician.   CONSULTS: Anticipate discharge home after urinalysis results.   OUTSIDE RECORDS REVIEWED: Reviewed patient's last office visit with Hampton Abbot with orthopedics on 09/13/2018.       FINAL CLINICAL IMPRESSION(S) / ED DIAGNOSES   Final diagnoses:  Unwitnessed fall  Atypical chest pain  Hypokalemia  Hypomagnesemia  PVC's (premature ventricular contractions)     Rx / DC Orders   ED Discharge Orders     None        Note:  This document was prepared using Dragon voice recognition software and may include unintentional dictation errors.   Marcella Charlson, Delice Bison, DO 08/31/21 [phone]

## 2021-08-31 NOTE — ED Provider Notes (Signed)
Patient received in signout from Dr. Leonides Schanz pending urinalysis to assess for any underlying infection that could have precipitated her mechanical fall today.  This UA does not show signs of infection and per original plan of care we will discharge the patient back home with close return precautions with the approval of family.   Vladimir Crofts, MD 08/31/21 1034

## 2021-09-05 ENCOUNTER — Other Ambulatory Visit: Payer: Self-pay | Admitting: Family Medicine

## 2021-09-05 DIAGNOSIS — Z1231 Encounter for screening mammogram for malignant neoplasm of breast: Secondary | ICD-10-CM

## 2021-09-11 ENCOUNTER — Other Ambulatory Visit: Payer: Self-pay

## 2021-09-11 ENCOUNTER — Emergency Department
Admission: EM | Admit: 2021-09-11 | Discharge: 2021-09-11 | Disposition: A | Discharge status: Home / Self Care | Payer: Medicare (Managed Care) | Attending: Emergency Medicine | Admitting: Emergency Medicine

## 2021-09-11 ENCOUNTER — Encounter: Payer: Self-pay | Admitting: Oncology

## 2021-09-11 ENCOUNTER — Encounter: Payer: Self-pay | Admitting: Emergency Medicine

## 2021-09-11 ENCOUNTER — Emergency Department: Payer: Medicare (Managed Care)

## 2021-09-11 DIAGNOSIS — I10 Essential (primary) hypertension: Secondary | ICD-10-CM | POA: Insufficient documentation

## 2021-09-11 DIAGNOSIS — R2241 Localized swelling, mass and lump, right lower limb: Secondary | ICD-10-CM | POA: Insufficient documentation

## 2021-09-11 DIAGNOSIS — E119 Type 2 diabetes mellitus without complications: Secondary | ICD-10-CM | POA: Diagnosis not present

## 2021-09-11 DIAGNOSIS — Z9011 Acquired absence of right breast and nipple: Secondary | ICD-10-CM | POA: Diagnosis not present

## 2021-09-11 DIAGNOSIS — Z853 Personal history of malignant neoplasm of breast: Secondary | ICD-10-CM | POA: Diagnosis not present

## 2021-09-11 DIAGNOSIS — L298 Other pruritus: Secondary | ICD-10-CM | POA: Insufficient documentation

## 2021-09-11 DIAGNOSIS — R600 Localized edema: Secondary | ICD-10-CM | POA: Diagnosis not present

## 2021-09-11 DIAGNOSIS — R790 Abnormal level of blood mineral: Secondary | ICD-10-CM | POA: Insufficient documentation

## 2021-09-11 DIAGNOSIS — Z79899 Other long term (current) drug therapy: Secondary | ICD-10-CM | POA: Insufficient documentation

## 2021-09-11 DIAGNOSIS — M7989 Other specified soft tissue disorders: Secondary | ICD-10-CM

## 2021-09-11 DIAGNOSIS — R609 Edema, unspecified: Secondary | ICD-10-CM | POA: Insufficient documentation

## 2021-09-11 DIAGNOSIS — L299 Pruritus, unspecified: Secondary | ICD-10-CM

## 2021-09-11 LAB — COMPREHENSIVE METABOLIC PANEL
ALT: 9 U/L (ref 0–44)
AST: 15 U/L (ref 15–41)
Albumin: 3.4 g/dL — ABNORMAL LOW (ref 3.5–5.0)
Alkaline Phosphatase: 65 U/L (ref 38–126)
Anion gap: 7 (ref 5–15)
BUN: 13 mg/dL (ref 8–23)
CO2: 25 mmol/L (ref 22–32)
Calcium: 8.8 mg/dL — ABNORMAL LOW (ref 8.9–10.3)
Chloride: 104 mmol/L (ref 98–111)
Creatinine, Ser: 1.06 mg/dL — ABNORMAL HIGH (ref 0.44–1.00)
GFR, Estimated: 49 mL/min — ABNORMAL LOW (ref >60)
Glucose, Bld: 106 mg/dL — ABNORMAL HIGH (ref 70–99)
Potassium: 4.1 mmol/L (ref 3.5–5.1)
Sodium: 136 mmol/L (ref 135–145)
Total Bilirubin: 0.6 mg/dL (ref 0.3–1.2)
Total Protein: 6.8 g/dL (ref 6.5–8.1)

## 2021-09-11 LAB — CBC WITH DIFFERENTIAL/PLATELET
Abs Immature Granulocytes: 0.03 10*3/uL (ref 0.00–0.07)
Basophils Absolute: 0.1 10*3/uL (ref 0.0–0.1)
Basophils Relative: 1 %
Eosinophils Absolute: 0.6 10*3/uL — ABNORMAL HIGH (ref 0.0–0.5)
Eosinophils Relative: 8 %
HCT: 35.2 % — ABNORMAL LOW (ref 36.0–46.0)
Hemoglobin: 11.3 g/dL — ABNORMAL LOW (ref 12.0–15.0)
Immature Granulocytes: 0 %
Lymphocytes Relative: 22 %
Lymphs Abs: 1.7 10*3/uL (ref 0.7–4.0)
MCH: 27.1 pg (ref 26.0–34.0)
MCHC: 32.1 g/dL (ref 30.0–36.0)
MCV: 84.4 fL (ref 80.0–100.0)
Monocytes Absolute: 0.7 10*3/uL (ref 0.1–1.0)
Monocytes Relative: 10 %
Neutro Abs: 4.6 10*3/uL (ref 1.7–7.7)
Neutrophils Relative %: 59 %
Platelets: 306 10*3/uL (ref 150–400)
RBC: 4.17 MIL/uL (ref 3.87–5.11)
RDW: 13.9 % (ref 11.5–15.5)
WBC: 7.7 10*3/uL (ref 4.0–10.5)
nRBC: 0 % (ref 0.0–0.2)

## 2021-09-11 LAB — TROPONIN I (HIGH SENSITIVITY): Troponin I (High Sensitivity): 12 ng/L (ref <18)

## 2021-09-11 LAB — BRAIN NATRIURETIC PEPTIDE: B Natriuretic Peptide: 394.4 pg/mL — ABNORMAL HIGH (ref 0.0–100.0)

## 2021-09-11 MED ORDER — FUROSEMIDE 10 MG/ML IJ SOLN
40.0000 mg | Freq: Once | INTRAMUSCULAR | Status: AC
  Administered 2021-09-11: 40 mg via INTRAVENOUS
  Filled 2021-09-11: qty 4

## 2021-09-11 MED ORDER — DIPHENHYDRAMINE HCL 25 MG PO CAPS
25.0000 mg | ORAL_CAPSULE | Freq: Once | ORAL | Status: AC
  Administered 2021-09-11: 25 mg via ORAL
  Filled 2021-09-11: qty 1

## 2021-09-11 NOTE — ED Notes (Signed)
Pt discharged with son. Pt and family verbalized understanding of discharge paperwork.

## 2021-09-11 NOTE — ED Notes (Addendum)
Patient assisted to restroom by this RN and [place] tech. Old brief removed and new, clean brief given to patient.

## 2021-09-11 NOTE — ED Provider Notes (Signed)
My  [facility] Provider Note    Event Date/Time   First MD Initiated Contact with Patient 09/11/21 [phone]     (approximate)   History   Pruritis   HPI  Nancy Zhang is a 88 y.o. female with history of hypertension, diabetes, hyperlipidemia who presents to the emergency department with complaints of diffuse itching.  Patient is an extremely poor historian.  Son provides some of the history but he is also a poor historian.  States that she has had itching ongoing for about 2 weeks now has been seen by her primary care doctor and given a "cream".  She denies any rash.  She lives at home alone.  No lip or tongue swelling, difficulty breathing.  She also reports she has increased swelling in her legs and now off her right upper extremity.  No history of PE or DVT.  Denies chest pain or shortness of breath.  No injury to this arm.  It is not painful.  Son reports she was recently taken off of furosemide.  She does have swelling in both legs as well.   History provided by patient and son.    Past Medical History:  Diagnosis Date   Breast cancer ([place]) 2010   RT LUMPECTOMY   Diabetes ([place])    Diverticulosis    Hypercholesterolemia    Hypertension    Intention tremor    Personal history of chemotherapy 2010   BREAST CA   Personal history of radiation therapy 2010   BREAST CA    Past Surgical History:  Procedure Laterality Date   ABDOMINAL HYSTERECTOMY     APPENDECTOMY     BREAST EXCISIONAL BIOPSY Right 2010   positive   INTRAMEDULLARY (IM) NAIL INTERTROCHANTERIC Left 07/08/2018   Procedure: INTRAMEDULLARY (IM) NAIL INTERTROCHANTRIC;  Surgeon: Leim Fabry, MD;  Location: [facility] ORS;  Service: Orthopedics;  Laterality: Left;    MEDICATIONS:  Prior to Admission medications   Medication Sig Start Date End Date Taking? Authorizing Provider  acetaminophen (TYLENOL) 325 MG tablet Take 2 tablets (650 mg total) by mouth every 6 (six) hours as needed for mild  pain, fever or headache. 07/11/18   Loletha Grayer, MD  amLODipine (NORVASC) 2.5 MG tablet Take 1 tablet (2.5 mg total) by mouth daily. Patient not taking: Reported on 10/16/2020 07/12/18   Loletha Grayer, MD  amLODipine (NORVASC) 5 MG tablet Take 5 mg by mouth daily. 10/14/20   [provider]  atorvastatin (LIPITOR) 10 MG tablet Take 10 mg by mouth daily at 6 PM.  Patient not taking: Reported on 10/16/2020    [provider]  Calcium Carbonate-Vitamin D3 600-400 MG-UNIT TABS Take 1 tablet by mouth daily as needed. 10/14/20   [provider]  docusate sodium (COLACE) 100 MG capsule Take 1 capsule (100 mg total) by mouth 2 (two) times daily. 07/11/18   Loletha Grayer, MD  enoxaparin (LOVENOX) 40 MG/0.4ML injection Inject 0.4 mLs (40 mg total) into the skin daily. Patient not taking: Reported on 10/16/2020 07/10/18   Reche Dixon, PA-C  hydrochlorothiazide (HYDRODIURIL) 12.5 MG tablet Take 12.5 mg by mouth daily. 10/14/20   [provider]  metFORMIN (GLUCOPHAGE-XR) 500 MG 24 hr tablet Take 1 tablet by mouth 2 (two) times a day. Patient not taking: Reported on 10/16/2020 07/26/18   [provider]  oxyCODONE (OXY IR/ROXICODONE) 5 MG immediate release tablet Take 0.5-1 tablets (2.5-5 mg total) by mouth every 4 (four) hours as needed for moderate pain or  severe pain (pain score 4-6). Patient not taking: Reported on 10/16/2020 07/10/18   Reche Dixon, PA-C  quinapril (ACCUPRIL) 10 MG tablet Take 1 tablet (10 mg total) by mouth daily. Patient not taking: Reported on 10/16/2020 07/12/18   Loletha Grayer, MD  quinapril (ACCUPRIL) 40 MG tablet Take 40 mg by mouth daily. 10/14/20   [provider]  traMADol (ULTRAM) 50 MG tablet Take 1 tablet (50 mg total) by mouth every 6 (six) hours as needed for moderate pain. Patient not taking: Reported on 10/16/2020 07/10/18   Reche Dixon, PA-C    Physical Exam   Triage Vital Signs: ED Triage Vitals  Enc Vitals Group     BP  09/11/21 0013 (!) 145/60     Pulse Rate 09/11/21 0013 68     Resp 09/11/21 0013 18     Temp 09/11/21 0013 97.9 F (36.6 C)     Temp Source 09/11/21 0013 Oral     SpO2 09/11/21 0013 97 %     Weight 09/11/21 0019 134 lb 14.7 oz (61.2 kg)     Height 09/11/21 0019 '5\' 3"'$  (1.6 m)     Head Circumference --      Peak Flow --      Pain Score 09/11/21 0019 0     Pain Loc --      Pain Edu? --      Excl. in [place]? --     Most recent vital signs: Vitals:   09/11/21 0430 09/11/21 0438  BP: (!) 127/116 (!) 126/47  Pulse: 63 63  Resp: (!) 24 18  Temp:  98.3 F (36.8 C)  SpO2: 99% 97%    CONSTITUTIONAL: Alert and oriented and responds appropriately to questions.  Ultimately, chronically ill-appearing, in no distress HEAD: Normocephalic, atraumatic EYES: Conjunctivae clear, pupils appear equal, sclera nonicteric ENT: normal nose; moist mucous membranes, normal phonation, no angioedema, no stridor, no trismus or drooling NECK: Supple, normal ROM CARD: RRR; S1 and S2 appreciated; no murmurs, no clicks, no rubs, no gallops RESP: Normal chest excursion without splinting or tachypnea; breath sounds clear and equal bilaterally; no wheezes, no rhonchi, no rales, no hypoxia or respiratory distress, speaking full sentences ABD/GI: Normal bowel sounds; non-distended; soft, non-tender, no rebound, no guarding, no peritoneal signs BACK: The back appears normal EXT: Normal ROM in all joints; no deformity noted, bilateral pitting edema in her lower distal extremities.  Also has swelling noted to the right arm diffusely compared to the left.  Extremities warm and well-perfused.  No ecchymosis, redness, induration, fluctuance, open wounds.  Compartments are soft.  No joint effusions.  No bony deformity. SKIN: Normal color for age and race; warm; she has some superficial lesions noted to bilateral upper extremities likely from scratching but no rash, urticaria.  No jaundice. NEURO: Moves all extremities equally,  normal speech PSYCH: The patient's mood and manner are appropriate.   ED Results / Procedures / Treatments   LABS: (all labs ordered are listed, but only abnormal results are displayed) Labs Reviewed  CBC WITH DIFFERENTIAL/PLATELET - Abnormal; Notable for the following components:      Result Value   Hemoglobin 11.3 (*)    HCT 35.2 (*)    Eosinophils Absolute 0.6 (*)    All other components within normal limits  COMPREHENSIVE METABOLIC PANEL - Abnormal; Notable for the following components:   Glucose, Bld 106 (*)    Creatinine, Ser 1.06 (*)    Calcium 8.8 (*)    Albumin 3.4 (*)  GFR, Estimated 49 (*)    All other components within normal limits  BRAIN NATRIURETIC PEPTIDE - Abnormal; Notable for the following components:   B Natriuretic Peptide 394.4 (*)    All other components within normal limits  TROPONIN I (HIGH SENSITIVITY)  TROPONIN I (HIGH SENSITIVITY)     EKG:  EKG Interpretation  Date/Time:  Thursday September 11 2021 03:13:29 EDT Ventricular Rate:  62 PR Interval:  203 QRS Duration: 86 QT Interval:  421 QTC Calculation: 428 R Axis:   -54 Text Interpretation: Sinus rhythm Left axis deviation Low voltage, extremity and precordial leads Consider anterior infarct Confirmed by Pryor Curia [phone]) on 09/11/2021 3:47:51 AM         RADIOLOGY: My personal review and interpretation of imaging: Ultrasound shows no DVT.  I have personally reviewed all radiology reports.   US Venous Img Upper Uni Right(DVT)  Result Date: 09/11/2021 CLINICAL DATA:  88 year old female with right arm swelling for 2 weeks. History of breast cancer. EXAM: RIGHT UPPER EXTREMITY VENOUS DOPPLER ULTRASOUND TECHNIQUE: Gray-scale sonography with graded compression, as well as color Doppler and duplex ultrasound were performed to evaluate the upper extremity deep venous system from the level of the subclavian vein and including the jugular, axillary, basilic, radial, ulnar and upper cephalic vein.  Spectral Doppler was utilized to evaluate flow at rest and with distal augmentation maneuvers. COMPARISON:  01/18/2013. FINDINGS: Contralateral Subclavian Vein: Respiratory phasicity is normal and symmetric with the symptomatic side. No evidence of thrombus. Normal compressibility. Internal Jugular Vein: No evidence of thrombus. Normal compressibility, respiratory phasicity and response to augmentation. Subclavian Vein: No evidence of thrombus. Normal compressibility, respiratory phasicity and response to augmentation. Axillary Vein: No evidence of thrombus. Normal compressibility, respiratory phasicity and response to augmentation. Cephalic Vein: No evidence of thrombus. Normal compressibility, respiratory phasicity and response to augmentation. Basilic Vein: No evidence of thrombus. Normal compressibility, respiratory phasicity and response to augmentation. Brachial Veins: No evidence of thrombus. Normal compressibility, respiratory phasicity and response to augmentation. Radial Veins: No evidence of thrombus. Normal compressibility, respiratory phasicity and response to augmentation. Ulnar Veins: No evidence of thrombus. Normal compressibility, respiratory phasicity and response to augmentation. Other Findings:  None visualized. IMPRESSION: No evidence of right upper extremity deep venous thrombosis. Electronically Signed   By: Genevie Ann M.D.   On: 09/11/2021 04:27     PROCEDURES:  Critical Care performed: No      .1-3 Lead EKG Interpretation  Performed by: Jeily Guthridge, Delice Bison, DO Authorized by: Makel Mcmann, Delice Bison, DO     Interpretation: normal     ECG rate:  63   ECG rate assessment: normal     Rhythm: sinus rhythm     Ectopy: none     Conduction: normal       IMPRESSION / MDM / ASSESSMENT AND PLAN / ED COURSE  I reviewed the triage vital signs and the nursing notes.    Patient here with pruritus ongoing for 2 weeks without rash.  Also having increased edema in bilateral lower extremities  and now also in the right upper extremity.  The patient is on the cardiac monitor to evaluate for evidence of arrhythmia and/or significant heart rate changes.   DIFFERENTIAL DIAGNOSIS (includes but not limited to):   CHF exacerbation, DVT, no sign of cellulitis, compartment syndrome, septic arthritis, cerulea dolens.  Pruritus may be secondary to medications, dry skin but no sign of urticaria, TEN, SJS, RMSF, RPR, cellulitis, abscess.   Patient's presentation is most consistent  with acute presentation with potential threat to life or bodily function.   PLAN: We will obtain CBC, CMP, troponin, BNP, EKG, ultrasound of the right upper extremity.  We will give Benadryl for symptomatic relief.   MEDICATIONS GIVEN IN ED: Medications  diphenhydrAMINE (BENADRYL) capsule 25 mg (25 mg Oral Given 09/11/21 0319)  furosemide (LASIX) injection 40 mg (40 mg Intravenous Given 09/11/21 0436)     ED COURSE: Patient's labs show anemia which has been present previously.  Normal electrolytes.  Creatinine of 1.06.  Normal LFTs including normal total bilirubin.  Troponin negative.  BNP mildly elevated at 394.  Ultrasound of the right upper extremity reviewed and interpreted by myself radiologist and shows no DVT.  Again no sign of infectious etiology for the swelling or arterial obstruction.  Compartments are soft.  We will give her one-time dose of IV Lasix here to help with diuresis for her peripheral edema.  Her family member states that she was taken off of her diuretic but patient denies this.  Family member states he thinks she was taken off of it due to acute kidney injury.  Before increasing or sending her home with another prescription of furosemide given patient and family are in disagreement, have recommended very close follow-up with her primary care doctor.  I feel she would benefit from further outpatient diuresis for her peripheral edema but given no chest pain, shortness of breath, hypoxia, I do not  feel she needs to come into the hospital for this.  As for her pruritus, she is feeling better after Benadryl.  Again no sign of any life-threatening rash today.  No bilirubinemia that would be causing her pruritus.  I have recommended close follow-up with her primary care doctor.  She is comfortable with this plan.  Patient requesting discharge home.   At this time, I do not feel there is any life-threatening condition present. I reviewed all nursing notes, vitals, pertinent previous records.  All lab and urine results, EKGs, imaging ordered have been independently reviewed and interpreted by myself.  I reviewed all available radiology reports from any imaging ordered this visit.  Based on my assessment, I feel the patient is safe to be discharged home without further emergent workup and can continue workup as an outpatient as needed. Discussed all findings, treatment plan as well as usual and customary return precautions with patient and family.  They verbalize understanding and are comfortable with this plan.  Outpatient follow-up has been provided as needed.  All questions have been answered.    CONSULTS: Admission considered for signs of volume overload the patient denies chest pain, shortness of breath and has no hypoxia and is requesting discharge home.   OUTSIDE RECORDS REVIEWED: Reviewed patient's last orthopedic note with Hampton Abbot on 09/13/2018.       FINAL CLINICAL IMPRESSION(S) / ED DIAGNOSES   Final diagnoses:  Pruritus  Swelling of right upper extremity  Peripheral edema     Rx / DC Orders   ED Discharge Orders     None        Note:  This document was prepared using Dragon voice recognition software and may include unintentional dictation errors.   Jany Buckwalter, Delice Bison, DO 09/11/21 [phone]

## 2021-09-11 NOTE — ED Triage Notes (Signed)
EMS brings pt to lobby via w/c with no distress noted; reports itching to arms bilat with no known cause; no rash; pt st relief of symptoms at present

## 2021-09-11 NOTE — Discharge Instructions (Signed)
I recommend close discussion with your primary care doctor to see if you should continue diuretics to help with the fluid in your arm and legs.  You may take over-the-counter Benadryl 25 mg every 6 hours as needed for itching.  Please follow-up with your primary care doctor for your itching.

## 2022-12-23 ENCOUNTER — Emergency Department: Payer: Medicare (Managed Care)

## 2022-12-23 ENCOUNTER — Inpatient Hospital Stay
Admission: EM | Admit: 2022-12-23 | Discharge: 2022-12-27 | DRG: 164 | Disposition: A | Discharge status: Home Health | Payer: Medicare (Managed Care) | Attending: Hospitalist | Admitting: Hospitalist

## 2022-12-23 ENCOUNTER — Other Ambulatory Visit: Payer: Self-pay

## 2022-12-23 DIAGNOSIS — Z79899 Other long term (current) drug therapy: Secondary | ICD-10-CM

## 2022-12-23 DIAGNOSIS — E1122 Type 2 diabetes mellitus with diabetic chronic kidney disease: Secondary | ICD-10-CM | POA: Diagnosis present

## 2022-12-23 DIAGNOSIS — Z8781 Personal history of (healed) traumatic fracture: Secondary | ICD-10-CM

## 2022-12-23 DIAGNOSIS — I10 Essential (primary) hypertension: Secondary | ICD-10-CM | POA: Diagnosis present

## 2022-12-23 DIAGNOSIS — I129 Hypertensive chronic kidney disease with stage 1 through stage 4 chronic kidney disease, or unspecified chronic kidney disease: Secondary | ICD-10-CM | POA: Diagnosis present

## 2022-12-23 DIAGNOSIS — I2602 Saddle embolus of pulmonary artery with acute cor pulmonale: Secondary | ICD-10-CM | POA: Diagnosis not present

## 2022-12-23 DIAGNOSIS — E871 Hypo-osmolality and hyponatremia: Secondary | ICD-10-CM | POA: Diagnosis present

## 2022-12-23 DIAGNOSIS — R079 Chest pain, unspecified: Secondary | ICD-10-CM | POA: Diagnosis not present

## 2022-12-23 DIAGNOSIS — I2699 Other pulmonary embolism without acute cor pulmonale: Secondary | ICD-10-CM | POA: Diagnosis present

## 2022-12-23 DIAGNOSIS — Z923 Personal history of irradiation: Secondary | ICD-10-CM

## 2022-12-23 DIAGNOSIS — Z803 Family history of malignant neoplasm of breast: Secondary | ICD-10-CM

## 2022-12-23 DIAGNOSIS — Z9221 Personal history of antineoplastic chemotherapy: Secondary | ICD-10-CM

## 2022-12-23 DIAGNOSIS — E876 Hypokalemia: Secondary | ICD-10-CM | POA: Diagnosis present

## 2022-12-23 DIAGNOSIS — F0390 Unspecified dementia without behavioral disturbance: Secondary | ICD-10-CM | POA: Diagnosis present

## 2022-12-23 DIAGNOSIS — E1165 Type 2 diabetes mellitus with hyperglycemia: Secondary | ICD-10-CM | POA: Diagnosis present

## 2022-12-23 DIAGNOSIS — E7800 Pure hypercholesterolemia, unspecified: Secondary | ICD-10-CM | POA: Diagnosis present

## 2022-12-23 DIAGNOSIS — Z7984 Long term (current) use of oral hypoglycemic drugs: Secondary | ICD-10-CM

## 2022-12-23 DIAGNOSIS — Z7901 Long term (current) use of anticoagulants: Secondary | ICD-10-CM

## 2022-12-23 DIAGNOSIS — E119 Type 2 diabetes mellitus without complications: Secondary | ICD-10-CM

## 2022-12-23 DIAGNOSIS — Z91013 Allergy to seafood: Secondary | ICD-10-CM

## 2022-12-23 DIAGNOSIS — J219 Acute bronchiolitis, unspecified: Secondary | ICD-10-CM | POA: Diagnosis present

## 2022-12-23 DIAGNOSIS — Z1152 Encounter for screening for COVID-19: Secondary | ICD-10-CM

## 2022-12-23 DIAGNOSIS — R0902 Hypoxemia: Secondary | ICD-10-CM | POA: Diagnosis present

## 2022-12-23 DIAGNOSIS — Z853 Personal history of malignant neoplasm of breast: Secondary | ICD-10-CM

## 2022-12-23 DIAGNOSIS — N1832 Chronic kidney disease, stage 3b: Secondary | ICD-10-CM | POA: Diagnosis present

## 2022-12-23 LAB — PROCALCITONIN: Procalcitonin: <0.1 ng/mL

## 2022-12-23 LAB — BASIC METABOLIC PANEL
Anion gap: 14 (ref 5–15)
BUN: 25 mg/dL — ABNORMAL HIGH (ref 8–23)
CO2: 24 mmol/L (ref 22–32)
Calcium: 9 mg/dL (ref 8.9–10.3)
Chloride: 92 mmol/L — ABNORMAL LOW (ref 98–111)
Creatinine, Ser: 1.3 mg/dL — ABNORMAL HIGH (ref 0.44–1.00)
GFR, Estimated: 38 mL/min — ABNORMAL LOW (ref >60)
Glucose, Bld: 228 mg/dL — ABNORMAL HIGH (ref 70–99)
Potassium: 2.9 mmol/L — ABNORMAL LOW (ref 3.5–5.1)
Sodium: 130 mmol/L — ABNORMAL LOW (ref 135–145)

## 2022-12-23 LAB — PROTIME-INR
INR: 1.1 (ref 0.8–1.2)
Prothrombin Time: 14.6 s (ref 11.4–15.2)

## 2022-12-23 LAB — CBC
HCT: 38.3 % (ref 36.0–46.0)
Hemoglobin: 13 g/dL (ref 12.0–15.0)
MCH: 28 pg (ref 26.0–34.0)
MCHC: 33.9 g/dL (ref 30.0–36.0)
MCV: 82.5 fL (ref 80.0–100.0)
Platelets: 238 10*3/uL (ref 150–400)
RBC: 4.64 MIL/uL (ref 3.87–5.11)
RDW: 13.6 % (ref 11.5–15.5)
WBC: 14 10*3/uL — ABNORMAL HIGH (ref 4.0–10.5)
nRBC: 0 % (ref 0.0–0.2)

## 2022-12-23 LAB — HEPATIC FUNCTION PANEL
ALT: 13 U/L (ref 0–44)
AST: 25 U/L (ref 15–41)
Albumin: 3.9 g/dL (ref 3.5–5.0)
Alkaline Phosphatase: 72 U/L (ref 38–126)
Bilirubin, Direct: 0.1 mg/dL (ref 0.0–0.2)
Indirect Bilirubin: 0.7 mg/dL (ref 0.3–0.9)
Total Bilirubin: 0.8 mg/dL (ref 0.3–1.2)
Total Protein: 7.7 g/dL (ref 6.5–8.1)

## 2022-12-23 LAB — SARS CORONAVIRUS 2 BY RT PCR: SARS Coronavirus 2 by RT PCR: NEGATIVE

## 2022-12-23 LAB — TROPONIN I (HIGH SENSITIVITY)
Troponin I (High Sensitivity): 15 ng/L (ref <18)
Troponin I (High Sensitivity): 20 ng/L — ABNORMAL HIGH (ref <18)

## 2022-12-23 LAB — APTT: aPTT: 27 s (ref 24–36)

## 2022-12-23 MED ORDER — SODIUM CHLORIDE 0.9 % IV BOLUS
500.0000 mL | Freq: Once | INTRAVENOUS | Status: AC
  Administered 2022-12-23: 500 mL via INTRAVENOUS

## 2022-12-23 MED ORDER — HEPARIN BOLUS VIA INFUSION
4000.0000 [IU] | Freq: Once | INTRAVENOUS | Status: AC
  Administered 2022-12-23: 4000 [IU] via INTRAVENOUS
  Filled 2022-12-23: qty 4000

## 2022-12-23 MED ORDER — HEPARIN (PORCINE) 25000 UT/250ML-% IV SOLN
900.0000 [IU]/h | INTRAVENOUS | Status: DC
  Administered 2022-12-23 – 2022-12-25 (×3): 1000 [IU]/h via INTRAVENOUS
  Filled 2022-12-23 (×3): qty 250

## 2022-12-23 MED ORDER — IOHEXOL 350 MG/ML SOLN
60.0000 mL | Freq: Once | INTRAVENOUS | Status: AC | PRN
  Administered 2022-12-23: 60 mL via INTRAVENOUS

## 2022-12-23 NOTE — ED Provider Notes (Signed)
[facility] Provider Note    Event Date/Time   First MD Initiated Contact with Patient 12/23/22 2048     (approximate)   History   Chief Complaint: Chest Pain   HPI  Nancy Zhang is a 89 y.o. female with a history of diabetes, hypertension, dementia who was brought to the ED due to chest pain that started today after lunch.  Nonradiating, no shortness of breath.  Currently patient denies any symptoms.  No fever or chills.  Has had a mild intermittent cough recently.     Physical Exam   Triage Vital Signs: ED Triage Vitals  Encounter Vitals Group     BP 12/23/22 1643 (!) 151/65     Systolic BP Percentile --      Diastolic BP Percentile --      Pulse Rate 12/23/22 1643 87     Resp 12/23/22 1643 19     Temp 12/23/22 1643 98.2 F (36.8 C)     Temp Source 12/23/22 1643 Oral     SpO2 12/23/22 1643 97 %     Weight 12/23/22 1642 134 lb 7.7 oz (61 kg)     Height 12/23/22 1642 5\' 3"  (1.6 m)     Head Circumference --      Peak Flow --      Pain Score 12/23/22 1642 3     Pain Loc --      Pain Education --      Exclude from Growth Chart --     Most recent vital signs: Vitals:   12/23/22 2155 12/23/22 2230  BP: (!) 124/56 (!) 139/55  Pulse: 90 88  Resp: (!) 23 (!) 22  Temp: (!) 97.4 F (36.3 C)   SpO2: 92% 94%    General: Awake, no distress.  CV:  Good peripheral perfusion.  Regular rate and rhythm Resp:  Normal effort.  Clear to auscultation bilaterally.  Oxygen saturation 91% on room air Abd:  No distention.  Soft nontender Other:  No lower extremity edema   ED Results / Procedures / Treatments   Labs (all labs ordered are listed, but only abnormal results are displayed) Labs Reviewed  BASIC METABOLIC PANEL - Abnormal; Notable for the following components:      Result Value   Sodium 130 (*)    Potassium 2.9 (*)    Chloride 92 (*)    Glucose, Bld 228 (*)    BUN 25 (*)    Creatinine, Ser 1.30 (*)    GFR, Estimated 38 (*)     All other components within normal limits  CBC - Abnormal; Notable for the following components:   WBC 14.0 (*)    All other components within normal limits  TROPONIN I (HIGH SENSITIVITY) - Abnormal; Notable for the following components:   Troponin I (High Sensitivity) 20 (*)    All other components within normal limits  SARS CORONAVIRUS 2 BY RT PCR  PROCALCITONIN  APTT  PROTIME-INR  HEPARIN LEVEL (UNFRACTIONATED)  HEPATIC FUNCTION PANEL  TROPONIN I (HIGH SENSITIVITY)     EKG Interpreted by me Normal sinus rhythm rate of 86.  Left axis, normal intervals.  Poor R wave progression.  Normal ST segments and T waves.   RADIOLOGY Chest x-ray interpreted by me, unremarkable.  Radiology report reviewed  CT chest interpreted by me, shows saddle pulmonary embolism with extensive thrombi bilaterally.  Discussed with radiologist noting RV to LV ratio of 1.25   PROCEDURES:  .Critical Care  Performed by: Sharman Cheek, MD Authorized by: Sharman Cheek, MD   Critical care provider statement:    Critical care time (minutes):  35   Critical care time was exclusive of:  Separately billable procedures and treating other patients   Critical care was necessary to treat or prevent imminent or life-threatening deterioration of the following conditions:  Respiratory failure and circulatory failure   Critical care was time spent personally by me on the following activities:  Development of treatment plan with patient or surrogate, discussions with consultants, evaluation of patient's response to treatment, examination of patient, obtaining history from patient or surrogate, ordering and performing treatments and interventions, ordering and review of laboratory studies, ordering and review of radiographic studies, pulse oximetry, re-evaluation of patient's condition and review of old charts   Care discussed with: admitting provider      MEDICATIONS ORDERED IN ED: Medications  heparin ADULT  infusion 100 units/mL (25000 units/251mL) (1,000 Units/hr Intravenous New Bag/Given 12/23/22 2232)  sodium chloride 0.9 % bolus 500 mL (500 mLs Intravenous New Bag/Given 12/23/22 2150)  iohexol (OMNIPAQUE) 350 MG/ML injection 60 mL (60 mLs Intravenous Contrast Given 12/23/22 2125)  heparin bolus via infusion 4,000 Units (4,000 Units Intravenous Bolus from Bag 12/23/22 2232)     IMPRESSION / MDM / ASSESSMENT AND PLAN / ED COURSE  I reviewed the triage vital signs and the nursing notes.  DDx: Pulmonary embolism, pneumonia, pleural effusion, pericardial effusion, GERD, non-STEMI  Patient's presentation is most consistent with acute presentation with potential threat to life or bodily function.  Patient presents with chest pain, limited ability to describe her symptoms in detail.  Exam is unremarkable, vitals are unremarkable except for low normal oxygen saturation.  Labs and chest x-ray unremarkable, will obtain CT angiogram of the chest.   Clinical Course as of 12/23/22 2320  Wed Dec 23, 2022  2151 CTA chest interpreted by me, shows central PE with right main bronchus occlusion and left segmental bronchi occlusion.  RV appears dilated.  Vitals are unremarkable except for oxygen saturation of 91%.  Will discuss with vascular surgery, plan to start heparin and admit. [PS]    Clinical Course User Index [PS] Sharman Cheek, MD    ----------------------------------------- 11:20 PM on 12/23/2022 ----------------------------------------- Case discussed with hospitalist.   FINAL CLINICAL IMPRESSION(S) / ED DIAGNOSES   Final diagnoses:  Acute saddle pulmonary embolism with acute cor pulmonale (HCC)  Chronic dementia (HCC)     Rx / DC Orders   ED Discharge Orders     None        Note:  This document was prepared using Dragon voice recognition software and may include unintentional dictation errors.   Sharman Cheek, MD 12/23/22 2320

## 2022-12-23 NOTE — Progress Notes (Signed)
PHARMACY - ANTICOAGULATION CONSULT NOTE  Pharmacy Consult for Heparin  Indication: pulmonary embolus  Allergies  Allergen Reactions   Shellfish Allergy Swelling    Patient Measurements: Height: 5\' 3"  (160 cm) Weight: 61 kg (134 lb 7.7 oz) IBW/kg (Calculated) : 52.4 Heparin Dosing Weight: 61 kg   Vital Signs: Temp: 97.4 F (36.3 C) (10/09 2155) Temp Source: Oral (10/09 2155) BP: 124/56 (10/09 2155) Pulse Rate: 90 (10/09 2155)  Labs: Recent Labs    12/23/22 1645 12/23/22 1946  HGB 13.0  --   HCT 38.3  --   PLT 238  --   CREATININE 1.30*  --   TROPONINIHS 15 20*    Estimated Creatinine Clearance: 21.4 mL/min (A) (by C-G formula based on SCr of 1.3 mg/dL (H)).   Medical History: Past Medical History:  Diagnosis Date   Breast cancer ([facility]) 2010   RT LUMPECTOMY   Diabetes ([facility])    Diverticulosis    Hypercholesterolemia    Hypertension    Intention tremor    Personal history of chemotherapy 2010   BREAST CA   Personal history of radiation therapy 2010   BREAST CA    Medications:  (Not in a hospital admission)   Assessment: Pharmacy consulted to dose heparin in this 89  year old female admitted with PE.   Pt had lovenox 40 mg listed on PTA med list but med rec states "not taking".  CrCl = 21.4 ml/min   Goal of Therapy:  Heparin level 0.3-0.7 units/ml Monitor platelets by anticoagulation protocol: Yes   Plan:  Give 4000 units bolus x 1 Start heparin infusion at 1000 units/hr Check anti-Xa level in 8 hours and daily while on heparin Continue to monitor H&H and platelets  Giovani Neumeister D 12/23/2022,10:12 PM

## 2022-12-23 NOTE — ED Notes (Addendum)
FIRST NURSE:  Pt to ER from home after being seen by [facility] nurse.  Pt was c/o mid sternal; sharp chest pain.  EMS noted diminished breath sounds, pt given 2gms Magnesium, 125mg  Solumedrol, 1 duoneb, 1 albuterol tx, 324 ASA and 500cc LR.  Pt states she is feeling better at this time.

## 2022-12-23 NOTE — ED Triage Notes (Signed)
Pt presents to ED with c/o of CP that started after lunch today. Pt states pain is located L chest, pt denies radiation. NAD noted.

## 2022-12-23 NOTE — H&P (Incomplete)
History and Physical    Patient: Nancy Zhang WUJ:[id number] DOB: 1925-03-25 DOA: 12/23/2022 DOS: the patient was seen and examined on 12/23/2022 PCP: [facility]  Patient coming from: {Point_of_Origin:[address]}   Chief Complaint:  Chief Complaint  Patient presents with  . Chest Pain    HPI: Nancy Zhang is a 89 y.o. female with medical history significant for ***   Chart review shows***  In ED***  Labs show***  Review of Systems: ROS  Past Medical History:  Diagnosis Date  . Breast cancer (HCC) 2010   RT LUMPECTOMY  . Diabetes (HCC)   . Diverticulosis   . Hypercholesterolemia   . Hypertension   . Intention tremor   . Personal history of chemotherapy 2010   BREAST CA  . Personal history of radiation therapy 2010   BREAST CA   Past Surgical History:  Procedure Laterality Date  . ABDOMINAL HYSTERECTOMY    . APPENDECTOMY    . BREAST EXCISIONAL BIOPSY Right 2010   positive  . INTRAMEDULLARY (IM) NAIL INTERTROCHANTERIC Left 07/08/2018   Procedure: INTRAMEDULLARY (IM) NAIL INTERTROCHANTRIC;  Surgeon: Signa Kell, MD;  Location: [facility] ORS;  Service: Orthopedics;  Laterality: Left;   Social History:   reports that she has never smoked. She has never used smokeless tobacco. She reports that she does not drink alcohol and does not use drugs.  Allergies  Allergen Reactions  . Shellfish Allergy Swelling    Family History  Problem Relation Age of Onset  . Breast cancer Daughter 6    Prior to Admission medications   Medication Sig Start Date End Date Taking? Authorizing Provider  acetaminophen (TYLENOL) 325 MG tablet Take 2 tablets (650 mg total) by mouth every 6 (six) hours as needed for mild pain, fever or headache. 07/11/18   Alford Highland, MD  amLODipine (NORVASC) 2.5 MG tablet Take 1 tablet (2.5 mg total) by mouth daily. Patient not taking: Reported on 10/16/2020 07/12/18   Alford Highland, MD  amLODipine (NORVASC) 5 MG tablet Take 5 mg  by mouth daily. 10/14/20   [provider]  atorvastatin (LIPITOR) 10 MG tablet Take 10 mg by mouth daily at 6 PM.  Patient not taking: Reported on 10/16/2020    [provider]  Calcium Carbonate-Vitamin D3 600-400 MG-UNIT TABS Take 1 tablet by mouth daily as needed. 10/14/20   [provider]  docusate sodium (COLACE) 100 MG capsule Take 1 capsule (100 mg total) by mouth 2 (two) times daily. 07/11/18   Alford Highland, MD  enoxaparin (LOVENOX) 40 MG/0.4ML injection Inject 0.4 mLs (40 mg total) into the skin daily. Patient not taking: Reported on 10/16/2020 07/10/18   Dedra Skeens, PA-C  hydrochlorothiazide (HYDRODIURIL) 12.5 MG tablet Take 12.5 mg by mouth daily. 10/14/20   [provider]  metFORMIN (GLUCOPHAGE-XR) 500 MG 24 hr tablet Take 1 tablet by mouth 2 (two) times a day. Patient not taking: Reported on 10/16/2020 07/26/18   [provider]  oxyCODONE (OXY IR/ROXICODONE) 5 MG immediate release tablet Take 0.5-1 tablets (2.5-5 mg total) by mouth every 4 (four) hours as needed for moderate pain or severe pain (pain score 4-6). Patient not taking: Reported on 10/16/2020 07/10/18   Dedra Skeens, PA-C  quinapril (ACCUPRIL) 10 MG tablet Take 1 tablet (10 mg total) by mouth daily. Patient not taking: Reported on 10/16/2020 07/12/18   Alford Highland, MD  quinapril (ACCUPRIL) 40 MG tablet Take 40 mg by mouth daily. 10/14/20   [provider]  traMADol (  ULTRAM) 50 MG tablet Take 1 tablet (50 mg total) by mouth every 6 (six) hours as needed for moderate pain. Patient not taking: Reported on 10/16/2020 07/10/18   Dedra Skeens, PA-C     Vitals:   12/23/22 1643 12/23/22 1941 12/23/22 2155 12/23/22 2230  BP: (!) 151/65 (!) 152/64 (!) 124/56 (!) 139/55  Pulse: 87 75 90 88  Resp: 19 18 (!) 23 (!) 22  Temp: 98.2 F (36.8 C) 98 F (36.7 C) (!) 97.4 F (36.3 C)   TempSrc: Oral Oral Oral   SpO2: 97% 95% 92% 94%  Weight:      Height:       Physical Exam   Labs on  Admission: I have personally reviewed following labs and imaging studies  CBC: Recent Labs  Lab 12/23/22 1645  WBC 14.0*  HGB 13.0  HCT 38.3  MCV 82.5  PLT 238   Basic Metabolic Panel: Recent Labs  Lab 12/23/22 1645  NA 130*  K 2.9*  CL 92*  CO2 24  GLUCOSE 228*  BUN 25*  CREATININE 1.30*  CALCIUM 9.0   GFR: Estimated Creatinine Clearance: 21.4 mL/min (A) (by C-G formula based on SCr of 1.3 mg/dL (H)). Liver Function Tests: No results for input(s): "AST", "ALT", "ALKPHOS", "BILITOT", "PROT", "ALBUMIN" in the last 168 hours. No results for input(s): "LIPASE", "AMYLASE" in the last 168 hours. No results for input(s): "AMMONIA" in the last 168 hours. Coagulation Profile: No results for input(s): "INR", "PROTIME" in the last 168 hours. Cardiac Enzymes: No results for input(s): "CKTOTAL", "CKMB", "CKMBINDEX", "TROPONINI" in the last 168 hours. BNP (last 3 results) No results for input(s): "PROBNP" in the last 8760 hours. HbA1C: No results for input(s): "HGBA1C" in the last 72 hours. CBG: No results for input(s): "GLUCAP" in the last 168 hours. Lipid Profile: No results for input(s): "CHOL", "HDL", "LDLCALC", "TRIG", "CHOLHDL", "LDLDIRECT" in the last 72 hours. Thyroid Function Tests: No results for input(s): "TSH", "T4TOTAL", "FREET4", "T3FREE", "THYROIDAB" in the last 72 hours. Anemia Panel: No results for input(s): "VITAMINB12", "FOLATE", "FERRITIN", "TIBC", "IRON", "RETICCTPCT" in the last 72 hours. Urinalysis    Component Value Date/Time   COLORURINE YELLOW (A) 08/31/2021 0603   APPEARANCEUR CLEAR (A) 08/31/2021 0603   APPEARANCEUR Cloudy 01/14/2013 1234   LABSPEC 1.005 08/31/2021 0603   LABSPEC 1.022 01/14/2013 1234   PHURINE 6.0 08/31/2021 0603   GLUCOSEU 50 (A) 08/31/2021 0603   GLUCOSEU Negative 01/14/2013 1234   HGBUR SMALL (A) 08/31/2021 0603   BILIRUBINUR NEGATIVE 08/31/2021 0603   BILIRUBINUR Negative 01/14/2013 1234   KETONESUR NEGATIVE 08/31/2021  0603   PROTEINUR 30 (A) 08/31/2021 0603   NITRITE NEGATIVE 08/31/2021 0603   LEUKOCYTESUR NEGATIVE 08/31/2021 0603   LEUKOCYTESUR 1+ 01/14/2013 1234      Unresulted Labs (From admission, onward)     Start     Ordered   12/24/22 0700  Heparin level (unfractionated)  Once-Timed,   URGENT        12/23/22 2242   12/23/22 2304  Hepatic function panel  Add-on,   AD        12/23/22 2303   12/23/22 2212  APTT  Once-Timed,   STAT        12/23/22 2211   12/23/22 2212  Protime-INR  Once-Timed,   STAT        12/23/22 2211            Medications  heparin ADULT infusion 100 units/mL (25000 units/238mL) (1,000 Units/hr Intravenous New Bag/Given  12/23/22 2232)  sodium chloride 0.9 % bolus 500 mL (500 mLs Intravenous New Bag/Given 12/23/22 2150)  iohexol (OMNIPAQUE) 350 MG/ML injection 60 mL (60 mLs Intravenous Contrast Given 12/23/22 2125)  heparin bolus via infusion 4,000 Units (4,000 Units Intravenous Bolus from Bag 12/23/22 2232)    Radiological Exams on Admission: DG Chest 2 View  Result Date: 12/23/2022 CLINICAL DATA:  Midsternal sharp chest pain. Diminished breath sounds EXAM: CHEST - 2 VIEW COMPARISON:  Radiographs 08/31/2021 FINDINGS: Stable cardiomediastinal silhouette. Aortic atherosclerotic calcification. No focal consolidation, pleural effusion, or pneumothorax. No displaced rib fractures. IMPRESSION: No active cardiopulmonary disease. Electronically Signed   By: Minerva Fester M.D.   On: 12/23/2022 19:28     Data Reviewed: Relevant notes from primary care and specialist visits, past discharge summaries as available in EHR, including Care Everywhere. Prior diagnostic testing as pertinent to current admission diagnoses Updated medications and problem lists for reconciliation ED course, including vitals, labs, imaging, treatment and response to treatment Triage notes, nursing and pharmacy notes and ED provider's notes Notable results as noted in HPI  Assessment and Plan: No  notes have been filed under this hospital service. Service: Hospitalist       Prognosis: ***  DVT prophylaxis:  ***  Consults:  ***  Advance Care Planning:    Code Status: Prior   Family Communication:  ***  Disposition Plan:  ***  Severity of Illness: {Observation/Inpatient:21159}  Author: Gertha Calkin, MD 12/23/2022 11:08 PM  For on call review [email or web address].

## 2022-12-24 ENCOUNTER — Inpatient Hospital Stay: Payer: Medicare (Managed Care)

## 2022-12-24 ENCOUNTER — Encounter: Payer: Self-pay | Admitting: Oncology

## 2022-12-24 ENCOUNTER — Other Ambulatory Visit (HOSPITAL_COMMUNITY): Payer: Self-pay

## 2022-12-24 DIAGNOSIS — R071 Chest pain on breathing: Secondary | ICD-10-CM | POA: Diagnosis not present

## 2022-12-24 DIAGNOSIS — Z91013 Allergy to seafood: Secondary | ICD-10-CM | POA: Diagnosis not present

## 2022-12-24 DIAGNOSIS — R0902 Hypoxemia: Secondary | ICD-10-CM | POA: Diagnosis present

## 2022-12-24 DIAGNOSIS — Z923 Personal history of irradiation: Secondary | ICD-10-CM | POA: Diagnosis not present

## 2022-12-24 DIAGNOSIS — Z853 Personal history of malignant neoplasm of breast: Secondary | ICD-10-CM | POA: Diagnosis not present

## 2022-12-24 DIAGNOSIS — R079 Chest pain, unspecified: Secondary | ICD-10-CM | POA: Diagnosis present

## 2022-12-24 DIAGNOSIS — Z1152 Encounter for screening for COVID-19: Secondary | ICD-10-CM | POA: Diagnosis not present

## 2022-12-24 DIAGNOSIS — E1165 Type 2 diabetes mellitus with hyperglycemia: Secondary | ICD-10-CM | POA: Diagnosis present

## 2022-12-24 DIAGNOSIS — Z7901 Long term (current) use of anticoagulants: Secondary | ICD-10-CM | POA: Diagnosis not present

## 2022-12-24 DIAGNOSIS — I129 Hypertensive chronic kidney disease with stage 1 through stage 4 chronic kidney disease, or unspecified chronic kidney disease: Secondary | ICD-10-CM | POA: Diagnosis present

## 2022-12-24 DIAGNOSIS — I2602 Saddle embolus of pulmonary artery with acute cor pulmonale: Secondary | ICD-10-CM | POA: Diagnosis present

## 2022-12-24 DIAGNOSIS — E871 Hypo-osmolality and hyponatremia: Secondary | ICD-10-CM | POA: Diagnosis present

## 2022-12-24 DIAGNOSIS — Z79899 Other long term (current) drug therapy: Secondary | ICD-10-CM | POA: Diagnosis not present

## 2022-12-24 DIAGNOSIS — I2699 Other pulmonary embolism without acute cor pulmonale: Secondary | ICD-10-CM | POA: Diagnosis present

## 2022-12-24 DIAGNOSIS — E876 Hypokalemia: Secondary | ICD-10-CM | POA: Diagnosis present

## 2022-12-24 DIAGNOSIS — I10 Essential (primary) hypertension: Secondary | ICD-10-CM | POA: Diagnosis not present

## 2022-12-24 DIAGNOSIS — Z9221 Personal history of antineoplastic chemotherapy: Secondary | ICD-10-CM | POA: Diagnosis not present

## 2022-12-24 DIAGNOSIS — E7800 Pure hypercholesterolemia, unspecified: Secondary | ICD-10-CM | POA: Diagnosis present

## 2022-12-24 DIAGNOSIS — N1832 Chronic kidney disease, stage 3b: Secondary | ICD-10-CM | POA: Diagnosis present

## 2022-12-24 DIAGNOSIS — J219 Acute bronchiolitis, unspecified: Secondary | ICD-10-CM | POA: Diagnosis present

## 2022-12-24 DIAGNOSIS — E1122 Type 2 diabetes mellitus with diabetic chronic kidney disease: Secondary | ICD-10-CM | POA: Diagnosis present

## 2022-12-24 DIAGNOSIS — Z8781 Personal history of (healed) traumatic fracture: Secondary | ICD-10-CM | POA: Diagnosis not present

## 2022-12-24 DIAGNOSIS — Z803 Family history of malignant neoplasm of breast: Secondary | ICD-10-CM | POA: Diagnosis not present

## 2022-12-24 DIAGNOSIS — F0390 Unspecified dementia without behavioral disturbance: Secondary | ICD-10-CM | POA: Diagnosis present

## 2022-12-24 DIAGNOSIS — I2609 Other pulmonary embolism with acute cor pulmonale: Secondary | ICD-10-CM | POA: Diagnosis not present

## 2022-12-24 DIAGNOSIS — Z7984 Long term (current) use of oral hypoglycemic drugs: Secondary | ICD-10-CM | POA: Diagnosis not present

## 2022-12-24 LAB — COMPREHENSIVE METABOLIC PANEL
ALT: 13 U/L (ref 0–44)
AST: 23 U/L (ref 15–41)
Albumin: 3.2 g/dL — ABNORMAL LOW (ref 3.5–5.0)
Alkaline Phosphatase: 63 U/L (ref 38–126)
Anion gap: 10 (ref 5–15)
BUN: 26 mg/dL — ABNORMAL HIGH (ref 8–23)
CO2: 26 mmol/L (ref 22–32)
Calcium: 8.6 mg/dL — ABNORMAL LOW (ref 8.9–10.3)
Chloride: 96 mmol/L — ABNORMAL LOW (ref 98–111)
Creatinine, Ser: 1.27 mg/dL — ABNORMAL HIGH (ref 0.44–1.00)
GFR, Estimated: 39 mL/min — ABNORMAL LOW (ref >60)
Glucose, Bld: 319 mg/dL — ABNORMAL HIGH (ref 70–99)
Potassium: 3.3 mmol/L — ABNORMAL LOW (ref 3.5–5.1)
Sodium: 132 mmol/L — ABNORMAL LOW (ref 135–145)
Total Bilirubin: 0.7 mg/dL (ref 0.3–1.2)
Total Protein: 6.5 g/dL (ref 6.5–8.1)

## 2022-12-24 LAB — HEPARIN LEVEL (UNFRACTIONATED)
Heparin Unfractionated: 0.49 [IU]/mL (ref 0.30–0.70)
Heparin Unfractionated: 0.61 [IU]/mL (ref 0.30–0.70)

## 2022-12-24 LAB — GLUCOSE, CAPILLARY
Glucose-Capillary: 240 mg/dL — ABNORMAL HIGH (ref 70–99)
Glucose-Capillary: 206 mg/dL — ABNORMAL HIGH (ref 70–99)

## 2022-12-24 LAB — HEMOGLOBIN A1C
Hgb A1c MFr Bld: 7.1 % — ABNORMAL HIGH (ref 4.8–5.6)
Mean Plasma Glucose: 157.07 mg/dL

## 2022-12-24 LAB — CBC
HCT: 35.7 % — ABNORMAL LOW (ref 36.0–46.0)
Hemoglobin: 11.9 g/dL — ABNORMAL LOW (ref 12.0–15.0)
MCH: 28.3 pg (ref 26.0–34.0)
MCHC: 33.3 g/dL (ref 30.0–36.0)
MCV: 85 fL (ref 80.0–100.0)
Platelets: 218 10*3/uL (ref 150–400)
RBC: 4.2 MIL/uL (ref 3.87–5.11)
RDW: 13.5 % (ref 11.5–15.5)
WBC: 10.6 10*3/uL — ABNORMAL HIGH (ref 4.0–10.5)
nRBC: 0 % (ref 0.0–0.2)

## 2022-12-24 MED ORDER — HYDROCHLOROTHIAZIDE 12.5 MG PO TABS: 12.5000 mg | ORAL_TABLET | Freq: Every day | ORAL | Status: DC

## 2022-12-24 MED ORDER — MORPHINE SULFATE (PF) 2 MG/ML IV SOLN: 2.0000 mg | INTRAVENOUS | Status: DC | PRN

## 2022-12-24 MED ORDER — INSULIN ASPART 100 UNIT/ML IJ SOLN
0.0000 [IU] | Freq: Three times a day (TID) | INTRAMUSCULAR | Status: DC
  Administered 2022-12-25 – 2022-12-26 (×2): 2 [IU] via SUBCUTANEOUS
  Administered 2022-12-26: 3 [IU] via SUBCUTANEOUS
  Administered 2022-12-26: 2 [IU] via SUBCUTANEOUS
  Administered 2022-12-27: 5 [IU] via SUBCUTANEOUS
  Filled 2022-12-24 (×5): qty 1

## 2022-12-24 MED ORDER — SODIUM CHLORIDE 0.9 % IV SOLN: Freq: Once | INTRAVENOUS | Status: AC

## 2022-12-24 MED ORDER — ACETAMINOPHEN 325 MG PO TABS: 650.0000 mg | ORAL_TABLET | Freq: Four times a day (QID) | ORAL | Status: DC | PRN

## 2022-12-24 MED ORDER — ALBUTEROL SULFATE (2.5 MG/3ML) 0.083% IN NEBU
2.5000 mg | INHALATION_SOLUTION | Freq: Two times a day (BID) | RESPIRATORY_TRACT | Status: DC
  Filled 2022-12-24: qty 3

## 2022-12-24 MED ORDER — HYDROCODONE-ACETAMINOPHEN 5-325 MG PO TABS: 1.0000 | ORAL_TABLET | ORAL | Status: DC | PRN

## 2022-12-24 MED ORDER — ALBUTEROL SULFATE (2.5 MG/3ML) 0.083% IN NEBU: 2.5000 mg | INHALATION_SOLUTION | RESPIRATORY_TRACT | Status: DC | PRN

## 2022-12-24 MED ORDER — SODIUM CHLORIDE 0.9 % IV SOLN
500.0000 mg | INTRAVENOUS | Status: DC
  Administered 2022-12-24: 500 mg via INTRAVENOUS
  Filled 2022-12-24: qty 5

## 2022-12-24 MED ORDER — ALBUTEROL SULFATE (2.5 MG/3ML) 0.083% IN NEBU
2.5000 mg | INHALATION_SOLUTION | Freq: Four times a day (QID) | RESPIRATORY_TRACT | Status: DC
  Administered 2022-12-24 (×4): 2.5 mg via RESPIRATORY_TRACT
  Filled 2022-12-24 (×4): qty 3

## 2022-12-24 MED ORDER — AMLODIPINE BESYLATE 5 MG PO TABS
5.0000 mg | ORAL_TABLET | Freq: Every day | ORAL | Status: DC
  Administered 2022-12-25 – 2022-12-27 (×3): 5 mg via ORAL
  Filled 2022-12-24 (×4): qty 1

## 2022-12-24 MED ORDER — INSULIN ASPART 100 UNIT/ML IJ SOLN
0.0000 [IU] | Freq: Every day | INTRAMUSCULAR | Status: DC
  Administered 2022-12-24: 2 [IU] via SUBCUTANEOUS
  Administered 2022-12-25: 3 [IU] via SUBCUTANEOUS
  Filled 2022-12-24 (×2): qty 1

## 2022-12-24 MED ORDER — SODIUM CHLORIDE 0.9% FLUSH
3.0000 mL | Freq: Two times a day (BID) | INTRAVENOUS | Status: DC
  Administered 2022-12-24 – 2022-12-27 (×8): 3 mL via INTRAVENOUS

## 2022-12-24 NOTE — Progress Notes (Signed)
PHARMACY - ANTICOAGULATION CONSULT NOTE  Pharmacy Consult for Heparin  Indication: pulmonary embolus  Allergies  Allergen Reactions   Fish Allergy    Shellfish Allergy Swelling    Patient Measurements: Height: 5\' 3"  (160 cm) Weight: 61 kg (134 lb 7.7 oz) IBW/kg (Calculated) : 52.4 Heparin Dosing Weight: 61 kg   Vital Signs: Temp: 98 F (36.7 C) (10/10 0300) Temp Source: Oral (10/10 0300) BP: 105/45 (10/10 0600) Pulse Rate: 57 (10/10 0632)  Labs: Recent Labs    12/23/22 1645 12/23/22 1946 12/23/22 2228 12/24/22 0413 12/24/22 0610  HGB 13.0  --   --  11.9*  --   HCT 38.3  --   --  35.7*  --   PLT 238  --   --  218  --   APTT  --   --  27  --   --   LABPROT  --   --  14.6  --   --   INR  --   --  1.1  --   --   HEPARINUNFRC  --   --   --   --  0.49  CREATININE 1.30*  --   --  1.27*  --   TROPONINIHS 15 20*  --   --   --     Estimated Creatinine Clearance: 21.9 mL/min (A) (by C-G formula based on SCr of 1.27 mg/dL (H)).   Medical History: Past Medical History:  Diagnosis Date   Breast cancer (HCC) 2010   RT LUMPECTOMY   Diabetes (HCC)    Diverticulosis    Hypercholesterolemia    Hypertension    Intention tremor    Personal history of chemotherapy 2010   BREAST CA   Personal history of radiation therapy 2010   BREAST CA    Medications:  (Not in a hospital admission)   Assessment: Pharmacy consulted to dose heparin in this 89  year old female admitted with PE.   Pt had lovenox 40 mg listed on PTA med list but med rec states "not taking".  CrCl = 21.4 ml/min   Goal of Therapy:  Heparin level 0.3-0.7 units/ml Monitor platelets by anticoagulation protocol: Yes   Plan:  10/10:  HL @ 0610 = 0.49, therapeutic X 1 - Will continue pt on current rate and recheck HL on 10/10 @ 1400.  Ali Mclaurin D 12/24/2022,7:10 AM

## 2022-12-24 NOTE — ED Notes (Signed)
0700 hep draw

## 2022-12-24 NOTE — ED Notes (Signed)
This RN assisted patient to be placed on bedpan and be cleaned up. Patient urinated clear, yellow urine. This RN provided peri-care and placed patient in a new brief and placed a new paper chuck under the patient.

## 2022-12-24 NOTE — Consult Note (Signed)
Hospital Consult    Reason for Consult:  Pulmonary Embolism  Requesting Physician:  Dr Sharman Cheek MD  MRN #:  [id number]  History of Present Illness: This is a 89 y.o. female with a history of diabetes, hypertension, dementia who was brought to the ED due to chest pain that started today after lunch. Nonradiating, no shortness of breath. Currently patient denies any symptoms. No fever or chills. Has had a mild intermittent cough recently.   On exam this morning patient is resting comfortably in stretcher in the emergency department.  Patient remains on 2 L nasal cannula O2.  Upon exertion in the bed she does desaturate to 88 to 92%.  She does remain tachycardic at this time.  Neurovascular wise patient knows who she is but does not know where she is this morning.  She does have a noted history of dementia.  After speaking with the patient about a possible procedure she is unwilling to endorse going forward.  I will call the patient's daughter who is listed as a contact to speak with her about the patient's condition.  Vascular surgery was consulted for evaluation of a saddle pulmonary embolism.  Past Medical History:  Diagnosis Date   Breast cancer (HCC) 2010   RT LUMPECTOMY   Diabetes (HCC)    Diverticulosis    Hypercholesterolemia    Hypertension    Intention tremor    Personal history of chemotherapy 2010   BREAST CA   Personal history of radiation therapy 2010   BREAST CA    Past Surgical History:  Procedure Laterality Date   ABDOMINAL HYSTERECTOMY     APPENDECTOMY     BREAST EXCISIONAL BIOPSY Right 2010   positive   INTRAMEDULLARY (IM) NAIL INTERTROCHANTERIC Left 07/08/2018   Procedure: INTRAMEDULLARY (IM) NAIL INTERTROCHANTRIC;  Surgeon: Signa Kell, MD;  Location: [facility] ORS;  Service: Orthopedics;  Laterality: Left;    Allergies  Allergen Reactions   Fish Allergy    Shellfish Allergy Swelling    Prior to Admission medications   Medication Sig Start Date End  Date Taking? Authorizing Provider  albuterol (VENTOLIN HFA) 108 (90 Base) MCG/ACT inhaler Inhale 1-2 puffs into the lungs every 4 (four) hours as needed for wheezing or shortness of breath.   Yes [provider]  amLODipine (NORVASC) 5 MG tablet Take 5 mg by mouth daily. 10/14/20  Yes [provider]  Calcium Carbonate-Vitamin D3 600-400 MG-UNIT TABS Take 1 tablet by mouth daily. 10/14/20  Yes [provider]  cholecalciferol (VITAMIN D3) 25 MCG (1000 UNIT) tablet Take 1,000 Units by mouth daily. 12/15/22  Yes [provider]  fexofenadine (ALLEGRA) 180 MG tablet Take 180 mg by mouth daily. 12/15/22  Yes [provider]  hydrochlorothiazide (HYDRODIURIL) 12.5 MG tablet Take 12.5 mg by mouth daily. 10/14/20  Yes [provider]  metoprolol succinate (TOPROL-XL) 25 MG 24 hr tablet Take 25 mg by mouth daily. 12/15/22  Yes [provider]  acetaminophen (TYLENOL) 325 MG tablet Take 2 tablets (650 mg total) by mouth every 6 (six) hours as needed for mild pain, fever or headache. Patient not taking: Reported on 12/24/2022 07/11/18   Alford Highland, MD    Social History   Socioeconomic History   Marital status: Widowed    Spouse name: Not on file   Number of children: Not on file   Years of education: Not on file   Highest education level: Not on file  Occupational History   Not on file  Tobacco Use   Smoking status: Never   Smokeless tobacco: Never  Vaping Use   Vaping status: Never Used  Substance and Sexual Activity   Alcohol use: No   Drug use: No   Sexual activity: Not on file  Other Topics Concern   Not on file  Social History Narrative   Not on file   Social Determinants of Health   Financial Resource Strain: Not on file  Food Insecurity: Not on file  Transportation Needs: Not on file  Physical Activity: Not on file  Stress: Not on file  Social Connections: Not on file  Intimate Partner Violence: Not on file      Family History  Problem Relation Age of Onset   Breast cancer Daughter 26    ROS: Otherwise negative unless mentioned in HPI  Physical Examination  Vitals:   12/24/22 0600 12/24/22 0632  BP: (!) 105/45   Pulse: 62 (!) 57  Resp: 16 13  Temp:    SpO2: 92% 95%   Body mass index is 23.82 kg/m.  General:  WDWN in NAD Gait: Not observed HENT: WNL, normocephalic Pulmonary: normal non-labored breathing, without Rales, rhonchi,  wheezing Cardiac: regular, Tachycardia, without  Murmurs, rubs or gallops; without carotid bruits Abdomen: Positive bowel sounds throughout, soft, NT/ND, no masses Skin: Without rashes Vascular Exam/Pulses: Unable to palpate bilateral lower extremities.  Right lower extremity is +1 to +2 edema throughout. Extremities: without ischemic changes, without Gangrene , without cellulitis; without open wounds;  Musculoskeletal: no muscle wasting or atrophy  Neurologic: A&O X 3;  No focal weakness or paresthesias are detected; speech is somewhat slow. Hx of Dementia. Psychiatric:  The pt has Abnormal- Hx of dementia  affect. Lymph:  Unremarkable  CBC    Component Value Date/Time   WBC 10.6 (H) 12/24/2022 0413   RBC 4.20 12/24/2022 0413   HGB 11.9 (L) 12/24/2022 0413   HGB 11.1 (L) 01/19/2013 0441   HCT 35.7 (L) 12/24/2022 0413   HCT 32.9 (L) 01/19/2013 0441   PLT 218 12/24/2022 0413   PLT 169 01/19/2013 0441   MCV 85.0 12/24/2022 0413   MCV 84 01/19/2013 0441   MCH 28.3 12/24/2022 0413   MCHC 33.3 12/24/2022 0413   RDW 13.5 12/24/2022 0413   RDW 13.5 01/19/2013 0441   LYMPHSABS 1.7 09/11/2021 0314   LYMPHSABS 1.5 01/19/2013 0441   MONOABS 0.7 09/11/2021 0314   MONOABS 0.7 01/19/2013 0441   EOSABS 0.6 (H) 09/11/2021 0314   EOSABS 0.3 01/19/2013 0441   BASOSABS 0.1 09/11/2021 0314   BASOSABS 0.1 01/19/2013 0441    BMET    Component Value Date/Time   NA 132 (L) 12/24/2022 0413   NA 141 01/19/2013 0441   K 3.3 (L) 12/24/2022 0413   K 3.8  01/19/2013 0441   CL 96 (L) 12/24/2022 0413   CL 108 (H) 01/19/2013 0441   CO2 26 12/24/2022 0413   CO2 31 01/19/2013 0441   GLUCOSE 319 (H) 12/24/2022 0413   GLUCOSE 129 (H) 01/19/2013 0441   BUN 26 (H) 12/24/2022 0413   BUN 10 01/19/2013 0441   CREATININE 1.27 (H) 12/24/2022 0413   CREATININE 0.87 01/19/2013 0441   CALCIUM 8.6 (L) 12/24/2022 0413   CALCIUM 8.7 01/19/2013 0441   GFRNONAA 39 (L) 12/24/2022 0413   GFRNONAA >60 01/19/2013 0441   GFRAA >60 11/12/2018 0306   GFRAA >60 01/19/2013 0441    COAGS: Lab Results  Component Value Date   INR 1.1 12/23/2022  INR 1.0 07/07/2018   INR 1.2 01/15/2013     Non-Invasive Vascular Imaging:   EXAM:12/23/22 CT ANGIOGRAPHY CHEST WITH CONTRAST   TECHNIQUE: Multidetector CT imaging of the chest was performed using the standard protocol during bolus administration of intravenous contrast. Multiplanar CT image reconstructions and MIPs were obtained to evaluate the vascular anatomy.   RADIATION DOSE REDUCTION: This exam was performed according to the departmental dose-optimization program which includes automated exposure control, adjustment of the mA and/or kV according to patient size and/or use of iterative reconstruction technique.   CONTRAST:  60mL OMNIPAQUE IOHEXOL 350 MG/ML SOLN   COMPARISON:  Radiographs earlier today   FINDINGS: Cardiovascular: Acute pulmonary embolism at the bifurcation of the main pulmonary artery extending into the right and left pulmonary arteries as well as multiple lobar and segmental arteries bilaterally. There is evidence of right heart strain with RV/LV ratio of 1.25.   No pericardial effusion. Coronary artery and aortic atherosclerotic calcification.   Mediastinum/Nodes: Small hiatal hernia. Trachea is unremarkable. No thoracic adenopathy.   Lungs/Pleura: Biapical pleural-parenchymal scarring. Mild bronchial wall thickening. Scattered centrilobular micro nodules and abutting tree  opacities greatest in the left upper lobe. No pleural effusion or pneumothorax.   Upper Abdomen: No acute abnormality.   Musculoskeletal: No acute fracture.   Review of the MIP images confirms the above findings.   IMPRESSION: 1. Positive for acute PE with questionable CT evidence of right heart strain (RV/LV Ratio = 1.25). The appearance of the ventricles is subjectively within normal limits and prominent right ventricle may be a chronic finding given patient age. The presence of right heart strain has been associated with an increased risk of morbidity and mortality. Please refer to the "Code PE Focused" order set in EPIC. 2. Infectious/inflammatory bronchiolitis. 3. Aortic Atherosclerosis   Statin:  Yes.   Beta Blocker:  No. Aspirin:  No. ACEI:  Yes.   ARB:  No. CCB use:  Yes Other antiplatelets/anticoagulants:  No.    ASSESSMENT/PLAN: This is a 89 y.o. female who presents to [facility] emergency department with chest pain after lunch yesterday.  It was nonradiating with no reports of shortness of breath.  Upon examination in the emergency department patient was found to be short of breath on exertion and at rest.  CTA of the chest was obtained.  Patient was noted to have a saddle pulmonary embolism.  Vascular surgery plans on taking the patient to the vascular lab on 12/25/2022 for pulmonary thrombectomy.  I called both the patient's daughter and son to discuss the procedure over the phone.  I then went to the emergency department when they were physically available and spoke to the patient as well as them in detail about the procedure, benefits, risks, and complications.  They all verbalized that they would like to proceed with the procedure tomorrow.  I answered all their questions.  Patient will be made n.p.o. after midnight.  I also had a long discussion with both the daughter and son regarding the patient's need for anticoagulation therapy going forward.  They both verbalized  understanding that their mother would need to be on anticoagulation.  Ultrasounds of her lower extremities for DVT were also ordered and results are not back yet.  We will follow-up on that.   -I discussed the plan in detail with Dr. Festus Barren MD and he agrees with the plan.   Marcie Bal [facility] 12/24/2022 7:31 AM

## 2022-12-24 NOTE — ED Notes (Signed)
Per Rolla Plate, NP cardiovascular, pt. Will have thrombectomy tomorrow, family and pt. Aware. Pt is NPO AFTER MIDNIGHT.

## 2022-12-24 NOTE — H&P (View-Only) (Signed)
Follow up Progress Note:  Patient did have bilateral lower extremity ultrasounds done for evaluation of DVTs the results are as follows.  EXAM:12/24/22 BILATERAL LOWER EXTREMITY VENOUS DOPPLER ULTRASOUND   TECHNIQUE: Gray-scale sonography with graded compression, as well as color Doppler and duplex ultrasound were performed to evaluate the lower extremity deep venous systems from the level of the common femoral vein and including the common femoral, femoral, profunda femoral, popliteal and calf veins including the posterior tibial, peroneal and gastrocnemius veins when visible. The superficial great saphenous vein was also interrogated. Spectral Doppler was utilized to evaluate flow at rest and with distal augmentation maneuvers in the common femoral, femoral and popliteal veins.   COMPARISON:  None Available.   FINDINGS: RIGHT LOWER EXTREMITY   Common Femoral Vein: No evidence of thrombus. Normal compressibility, respiratory phasicity and response to augmentation.   Saphenofemoral Junction: No evidence of thrombus. Normal compressibility and flow on color Doppler imaging.   Profunda Femoral Vein: No evidence of thrombus. Normal compressibility and flow on color Doppler imaging.   Femoral Vein: No evidence of thrombus. Normal compressibility, respiratory phasicity and response to augmentation.   Popliteal Vein: No evidence of thrombus. Normal compressibility, respiratory phasicity and response to augmentation.   Calf Veins: Appear patent where imaged.   Superficial Great Saphenous Vein: No evidence of thrombus. Normal compressibility.   Other Findings:  None.   LEFT LOWER EXTREMITY   Common Femoral Vein: No evidence of thrombus. Normal compressibility, respiratory phasicity and response to augmentation.   Saphenofemoral Junction: No evidence of thrombus. Normal compressibility and flow on color Doppler imaging.   Profunda Femoral Vein: No evidence of thrombus.  Normal compressibility and flow on color Doppler imaging.   Femoral Vein: No evidence of thrombus. Normal compressibility, respiratory phasicity and response to augmentation.   Popliteal Vein: No evidence of thrombus. Normal compressibility, respiratory phasicity and response to augmentation.   Calf Veins: No evidence of thrombus. Normal compressibility and flow on color Doppler imaging.   Superficial Great Saphenous Vein: No evidence of thrombus. Normal compressibility.   Other Findings:  None.   IMPRESSION: No evidence of DVT within either lower extremity.

## 2022-12-24 NOTE — Progress Notes (Signed)
Follow up Progress Note:  Patient did have bilateral lower extremity ultrasounds done for evaluation of DVTs the results are as follows.  EXAM:12/24/22 BILATERAL LOWER EXTREMITY VENOUS DOPPLER ULTRASOUND   TECHNIQUE: Gray-scale sonography with graded compression, as well as color Doppler and duplex ultrasound were performed to evaluate the lower extremity deep venous systems from the level of the common femoral vein and including the common femoral, femoral, profunda femoral, popliteal and calf veins including the posterior tibial, peroneal and gastrocnemius veins when visible. The superficial great saphenous vein was also interrogated. Spectral Doppler was utilized to evaluate flow at rest and with distal augmentation maneuvers in the common femoral, femoral and popliteal veins.   COMPARISON:  None Available.   FINDINGS: RIGHT LOWER EXTREMITY   Common Femoral Vein: No evidence of thrombus. Normal compressibility, respiratory phasicity and response to augmentation.   Saphenofemoral Junction: No evidence of thrombus. Normal compressibility and flow on color Doppler imaging.   Profunda Femoral Vein: No evidence of thrombus. Normal compressibility and flow on color Doppler imaging.   Femoral Vein: No evidence of thrombus. Normal compressibility, respiratory phasicity and response to augmentation.   Popliteal Vein: No evidence of thrombus. Normal compressibility, respiratory phasicity and response to augmentation.   Calf Veins: Appear patent where imaged.   Superficial Great Saphenous Vein: No evidence of thrombus. Normal compressibility.   Other Findings:  None.   LEFT LOWER EXTREMITY   Common Femoral Vein: No evidence of thrombus. Normal compressibility, respiratory phasicity and response to augmentation.   Saphenofemoral Junction: No evidence of thrombus. Normal compressibility and flow on color Doppler imaging.   Profunda Femoral Vein: No evidence of thrombus.  Normal compressibility and flow on color Doppler imaging.   Femoral Vein: No evidence of thrombus. Normal compressibility, respiratory phasicity and response to augmentation.   Popliteal Vein: No evidence of thrombus. Normal compressibility, respiratory phasicity and response to augmentation.   Calf Veins: No evidence of thrombus. Normal compressibility and flow on color Doppler imaging.   Superficial Great Saphenous Vein: No evidence of thrombus. Normal compressibility.   Other Findings:  None.   IMPRESSION: No evidence of DVT within either lower extremity.

## 2022-12-24 NOTE — ED Notes (Signed)
Ultrasound at bedside

## 2022-12-24 NOTE — ED Notes (Signed)
Assumed care at this time by this RN.

## 2022-12-24 NOTE — H&P (Signed)
History and Physical    Patient: Nancy Zhang PPI:[id number] DOB: March 10, 1926 DOA: 12/23/2022 DOS: the patient was seen and examined on 12/24/2022 PCP: [facility]  Patient coming from: SN[facility]   Chief Complaint:  Chief Complaint  Patient presents with   Chest Pain    HPI: Nancy Zhang is a 89 y.o. female with medical history significant for  hypertension, diabetes mellitus type 2, CKD stage III a,presenting with chest pain, pt is oriented to self and place.  Presented today after being seen by home health nurse for chest pain and when EMS called and examined patient patient was given magnesium 2 g Solu-Medrol 125 DuoNeb and albuterol along with 324 aspirin and 500 LR by EMS.  Chest pain is new and is started earlier today after lunch with no radiation nausea vomiting.  No fevers chills no abdominal pain or diarrhea. Labs are notable for hyponatremia of 130, hypokalemia of 2.9 glucose 228 AKI on CKD with a creatinine of 1.30 EGFR of 38 normal LFTs. WBC of 14 with normal hemoglobin and platelet count.  Respiratory panel negative for COVID.  CT imaging of the chest shows right heart strain, infectious and inflammatory bronchiolitis.  After call report from radiology to EDMD patient was started on anticoagulation with a heparin drip.  Vascular surgery consult for a.m. Message sent.  Patient meets SIRS criteria on arrival and is stable on 2 L nasal cannula. EKG shows sinus rhythm at 86 with left axis deviation and poor R wave progression no ST-T wave changes.  Chest x-ray initially done is negative for any acute findings. In the emergency room initial vitals are stable except for mildly elevated blood pressure 151/65 pulse of 87 respirations of 19 temperature of 98.2 and O2 sats of 97%.  Medications  heparin ADULT infusion 100 units/mL (25000 units/230mL) (1,000 Units/hr Intravenous New Bag/Given 12/23/22 2232)  azithromycin (ZITHROMAX) 500 mg in sodium chloride 0.9 % 250 mL IVPB  (has no administration in time range)  acetaminophen (TYLENOL) tablet 650 mg (has no administration in time range)  amLODipine (NORVASC) tablet 5 mg (has no administration in time range)  sodium chloride flush (NS) 0.9 % injection 3 mL (has no administration in time range)  0.9 %  sodium chloride infusion (has no administration in time range)  HYDROcodone-acetaminophen (NORCO/VICODIN) 5-325 MG per tablet 1 tablet (has no administration in time range)  morphine (PF) 2 MG/ML injection 2 mg (has no administration in time range)  albuterol (PROVENTIL) (2.5 MG/3ML) 0.083% nebulizer solution 2.5 mg (has no administration in time range)  albuterol (VENTOLIN HFA) 108 (90 Base) MCG/ACT inhaler 1-2 puff (has no administration in time range)  sodium chloride 0.9 % bolus 500 mL (0 mLs Intravenous Stopped 12/24/22 0050)  iohexol (OMNIPAQUE) 350 MG/ML injection 60 mL (60 mLs Intravenous Contrast Given 12/23/22 2125)  heparin bolus via infusion 4,000 Units (4,000 Units Intravenous Bolus from Bag 12/23/22 2232)    Review of Systems: Review of Systems  Unable to perform ROS: Age   Past Medical History:  Diagnosis Date   Breast cancer (HCC) 2010   RT LUMPECTOMY   Diabetes (HCC)    Diverticulosis    Hypercholesterolemia    Hypertension    Intention tremor    Personal history of chemotherapy 2010   BREAST CA   Personal history of radiation therapy 2010   BREAST CA   Past Surgical History:  Procedure Laterality Date   ABDOMINAL HYSTERECTOMY     APPENDECTOMY  BREAST EXCISIONAL BIOPSY Right 2010   positive   INTRAMEDULLARY (IM) NAIL INTERTROCHANTERIC Left 07/08/2018   Procedure: INTRAMEDULLARY (IM) NAIL INTERTROCHANTRIC;  Surgeon: Signa Kell, MD;  Location: [facility] ORS;  Service: Orthopedics;  Laterality: Left;   Social History:   reports that she has never smoked. She has never used smokeless tobacco. She reports that she does not drink alcohol and does not use drugs.  Allergies  Allergen  Reactions   Shellfish Allergy Swelling    Family History  Problem Relation Age of Onset   Breast cancer Daughter 21    Prior to Admission medications   Medication Sig Start Date End Date Taking? Authorizing Provider  acetaminophen (TYLENOL) 325 MG tablet Take 2 tablets (650 mg total) by mouth every 6 (six) hours as needed for mild pain, fever or headache. 07/11/18   Alford Highland, MD  amLODipine (NORVASC) 2.5 MG tablet Take 1 tablet (2.5 mg total) by mouth daily. Patient not taking: Reported on 10/16/2020 07/12/18   Alford Highland, MD  amLODipine (NORVASC) 5 MG tablet Take 5 mg by mouth daily. 10/14/20   [provider]  atorvastatin (LIPITOR) 10 MG tablet Take 10 mg by mouth daily at 6 PM.  Patient not taking: Reported on 10/16/2020    [provider]  Calcium Carbonate-Vitamin D3 600-400 MG-UNIT TABS Take 1 tablet by mouth daily as needed. 10/14/20   [provider]  docusate sodium (COLACE) 100 MG capsule Take 1 capsule (100 mg total) by mouth 2 (two) times daily. 07/11/18   Alford Highland, MD  enoxaparin (LOVENOX) 40 MG/0.4ML injection Inject 0.4 mLs (40 mg total) into the skin daily. Patient not taking: Reported on 10/16/2020 07/10/18   Dedra Skeens, PA-C  hydrochlorothiazide (HYDRODIURIL) 12.5 MG tablet Take 12.5 mg by mouth daily. 10/14/20   [provider]  metFORMIN (GLUCOPHAGE-XR) 500 MG 24 hr tablet Take 1 tablet by mouth 2 (two) times a day. Patient not taking: Reported on 10/16/2020 07/26/18   [provider]  oxyCODONE (OXY IR/ROXICODONE) 5 MG immediate release tablet Take 0.5-1 tablets (2.5-5 mg total) by mouth every 4 (four) hours as needed for moderate pain or severe pain (pain score 4-6). Patient not taking: Reported on 10/16/2020 07/10/18   Dedra Skeens, PA-C  quinapril (ACCUPRIL) 10 MG tablet Take 1 tablet (10 mg total) by mouth daily. Patient not taking: Reported on 10/16/2020 07/12/18   Alford Highland, MD  quinapril (ACCUPRIL) 40 MG tablet  Take 40 mg by mouth daily. 10/14/20   [provider]  traMADol (ULTRAM) 50 MG tablet Take 1 tablet (50 mg total) by mouth every 6 (six) hours as needed for moderate pain. Patient not taking: Reported on 10/16/2020 07/10/18   Dedra Skeens, PA-C   Vitals:   12/23/22 2230 12/23/22 2300 12/24/22 0030 12/24/22 0100  BP: (!) 139/55 138/65 102/62 132/63  Pulse: 88 94 61 (!) 59  Resp: (!) 22 (!) 29  18  Temp:      TempSrc:      SpO2: 94% 97% 98% 98%  Weight:      Height:       Physical Exam Vitals and nursing note reviewed.  Constitutional:      General: She is not in acute distress.    Appearance: She is obese.  HENT:     Head: Normocephalic and atraumatic.     Right Ear: Hearing and external ear normal.     Left Ear: Hearing and external ear normal.     Nose: Nose  normal. No nasal deformity.     Mouth/Throat:     Lips: Pink.     Tongue: No lesions.     Pharynx: Oropharynx is clear.  Eyes:     General: Lids are normal.     Extraocular Movements: Extraocular movements intact.     Pupils: Pupils are equal, round, and reactive to light.  Cardiovascular:     Rate and Rhythm: Normal rate and regular rhythm.     Heart sounds: Normal heart sounds.  Pulmonary:     Effort: Pulmonary effort is normal.     Breath sounds: Examination of the right-lower field reveals wheezing. Examination of the left-lower field reveals wheezing. Wheezing present.  Abdominal:     General: Bowel sounds are normal. There is no distension.     Palpations: Abdomen is soft. There is no mass.     Tenderness: There is no abdominal tenderness.  Musculoskeletal:     Right lower leg: No edema.     Left lower leg: No edema.  Skin:    General: Skin is warm.  Neurological:     General: No focal deficit present.     Mental Status: She is alert and oriented to person, place, and time.     Cranial Nerves: Cranial nerves 2-12 are intact.     Comments: Oriented to place and self only, nonfocal exam and following  commands.   Psychiatric:        Attention and Perception: Attention normal.        Mood and Affect: Mood normal.        Speech: Speech normal.        Behavior: Behavior normal. Behavior is cooperative.    Labs on Admission: I have personally reviewed following labs and imaging studies.  CBC: Recent Labs  Lab 12/23/22 1645  WBC 14.0*  HGB 13.0  HCT 38.3  MCV 82.5  PLT 238   Basic Metabolic Panel: Recent Labs  Lab 12/23/22 1645  NA 130*  K 2.9*  CL 92*  CO2 24  GLUCOSE 228*  BUN 25*  CREATININE 1.30*  CALCIUM 9.0   GFR: Estimated Creatinine Clearance: 21.4 mL/min (A) (by C-G formula based on SCr of 1.3 mg/dL (H)). Liver Function Tests: Recent Labs  Lab 12/23/22 1946  AST 25  ALT 13  ALKPHOS 72  BILITOT 0.8  PROT 7.7  ALBUMIN 3.9   No results for input(s): "LIPASE", "AMYLASE" in the last 168 hours. No results for input(s): "AMMONIA" in the last 168 hours. Coagulation Profile: Recent Labs  Lab 12/23/22 2228  INR 1.1   Cardiac Enzymes: No results for input(s): "CKTOTAL", "CKMB", "CKMBINDEX", "TROPONINI" in the last 168 hours. BNP (last 3 results) No results for input(s): "PROBNP" in the last 8760 hours. HbA1C: No results for input(s): "HGBA1C" in the last 72 hours. CBG: No results for input(s): "GLUCAP" in the last 168 hours. Lipid Profile: No results for input(s): "CHOL", "HDL", "LDLCALC", "TRIG", "CHOLHDL", "LDLDIRECT" in the last 72 hours. Thyroid Function Tests: No results for input(s): "TSH", "T4TOTAL", "FREET4", "T3FREE", "THYROIDAB" in the last 72 hours. Anemia Panel: No results for input(s): "VITAMINB12", "FOLATE", "FERRITIN", "TIBC", "IRON", "RETICCTPCT" in the last 72 hours. Urinalysis    Component Value Date/Time   COLORURINE YELLOW (A) 08/31/2021 0603   APPEARANCEUR CLEAR (A) 08/31/2021 0603   APPEARANCEUR Cloudy 01/14/2013 1234   LABSPEC 1.005 08/31/2021 0603   LABSPEC 1.022 01/14/2013 1234   PHURINE 6.0 08/31/2021 0603   GLUCOSEU 50  (A) 08/31/2021 7829  GLUCOSEU Negative 01/14/2013 1234   HGBUR SMALL (A) 08/31/2021 0603   BILIRUBINUR NEGATIVE 08/31/2021 0603   BILIRUBINUR Negative 01/14/2013 1234   KETONESUR NEGATIVE 08/31/2021 0603   PROTEINUR 30 (A) 08/31/2021 0603   NITRITE NEGATIVE 08/31/2021 0603   LEUKOCYTESUR NEGATIVE 08/31/2021 0603   LEUKOCYTESUR 1+ 01/14/2013 1234   Unresulted Labs (From admission, onward)     Start     Ordered   12/24/22 0700  Heparin level (unfractionated)  Once-Timed,   URGENT        12/23/22 2242   12/24/22 0500  Comprehensive metabolic panel  Tomorrow morning,   R        12/24/22 0152   12/24/22 0500  CBC  Tomorrow morning,   R        12/24/22 0152   12/24/22 0150  Hemoglobin A1c  Once,   R        12/24/22 0152           Medications  heparin ADULT infusion 100 units/mL (25000 units/272mL) (1,000 Units/hr Intravenous New Bag/Given 12/23/22 2232)  azithromycin (ZITHROMAX) 500 mg in sodium chloride 0.9 % 250 mL IVPB (has no administration in time range)  acetaminophen (TYLENOL) tablet 650 mg (has no administration in time range)  amLODipine (NORVASC) tablet 5 mg (has no administration in time range)  sodium chloride flush (NS) 0.9 % injection 3 mL (has no administration in time range)  0.9 %  sodium chloride infusion (has no administration in time range)  HYDROcodone-acetaminophen (NORCO/VICODIN) 5-325 MG per tablet 1 tablet (has no administration in time range)  morphine (PF) 2 MG/ML injection 2 mg (has no administration in time range)  albuterol (PROVENTIL) (2.5 MG/3ML) 0.083% nebulizer solution 2.5 mg (has no administration in time range)  albuterol (VENTOLIN HFA) 108 (90 Base) MCG/ACT inhaler 1-2 puff (has no administration in time range)  sodium chloride 0.9 % bolus 500 mL (0 mLs Intravenous Stopped 12/24/22 0050)  iohexol (OMNIPAQUE) 350 MG/ML injection 60 mL (60 mLs Intravenous Contrast Given 12/23/22 2125)  heparin bolus via infusion 4,000 Units (4,000 Units  Intravenous Bolus from Bag 12/23/22 2232)    Radiological Exams on Admission: CT Angio Chest PE W and/or Wo Contrast  Result Date: 12/23/2022 CLINICAL DATA:  Chest pain.  PE suspected. EXAM: CT ANGIOGRAPHY CHEST WITH CONTRAST TECHNIQUE: Multidetector CT imaging of the chest was performed using the standard protocol during bolus administration of intravenous contrast. Multiplanar CT image reconstructions and MIPs were obtained to evaluate the vascular anatomy. RADIATION DOSE REDUCTION: This exam was performed according to the departmental dose-optimization program which includes automated exposure control, adjustment of the mA and/or kV according to patient size and/or use of iterative reconstruction technique. CONTRAST:  60mL OMNIPAQUE IOHEXOL 350 MG/ML SOLN COMPARISON:  Radiographs earlier today FINDINGS: Cardiovascular: Acute pulmonary embolism at the bifurcation of the main pulmonary artery extending into the right and left pulmonary arteries as well as multiple lobar and segmental arteries bilaterally. There is evidence of right heart strain with RV/LV ratio of 1.25. No pericardial effusion. Coronary artery and aortic atherosclerotic calcification. Mediastinum/Nodes: Small hiatal hernia. Trachea is unremarkable. No thoracic adenopathy. Lungs/Pleura: Biapical pleural-parenchymal scarring. Mild bronchial wall thickening. Scattered centrilobular micro nodules and abutting tree opacities greatest in the left upper lobe. No pleural effusion or pneumothorax. Upper Abdomen: No acute abnormality. Musculoskeletal: No acute fracture. Review of the MIP images confirms the above findings. IMPRESSION: 1. Positive for acute PE with questionable CT evidence of right heart strain (RV/LV Ratio = 1.25). The  appearance of the ventricles is subjectively within normal limits and prominent right ventricle may be a chronic finding given patient age. The presence of right heart strain has been associated with an increased risk of  morbidity and mortality. Please refer to the "Code PE Focused" order set in EPIC. 2. Infectious/inflammatory bronchiolitis. 3. Aortic Atherosclerosis (ICD10-I70.0). Critical Value/emergent results were called by telephone at the time of interpretation on 12/23/2022 at 11:08 pm to provider PHILLIP STAFFORD , who verbally acknowledged these results. Electronically Signed   By: Minerva Fester M.D.   On: 12/23/2022 23:26   DG Chest 2 View  Result Date: 12/23/2022 CLINICAL DATA:  Midsternal sharp chest pain. Diminished breath sounds EXAM: CHEST - 2 VIEW COMPARISON:  Radiographs 08/31/2021 FINDINGS: Stable cardiomediastinal silhouette. Aortic atherosclerotic calcification. No focal consolidation, pleural effusion, or pneumothorax. No displaced rib fractures. IMPRESSION: No active cardiopulmonary disease. Electronically Signed   By: Minerva Fester M.D.   On: 12/23/2022 19:28     Data Reviewed: Relevant notes from primary care and specialist visits, past discharge summaries as available in EHR, including Care Everywhere. Prior diagnostic testing as pertinent to current admission diagnoses Updated medications and problem lists for reconciliation ED course, including vitals, labs, imaging, treatment and response to treatment Triage notes, nursing and pharmacy notes and ED provider's notes Notable results as noted in HPI  Assessment and Plan: >>Chest pain/ Pulmonary Embolism: Noncardiac atypical chest pain from Pulmonary embolism. Cont heparin gtt per PE protocol.  TNI mildly elevated due to right heart strain.  Supplemental oxygen to keep o2 sats about 90%.  2 d echo with bubble study.   >>Hyponatremia/ hypokalemia: Per pharmacy protocol. Hold diuretic.  MIVF at low rate.  No intake or output data in the 24 hours ending 12/24/22 0155  >>HTN: Vitals:   12/23/22 1643 12/23/22 1941 12/23/22 2155 12/23/22 2230  BP: (!) 151/65 (!) 152/64 (!) 124/56 (!) 139/55   12/23/22 2300 12/24/22 0030 12/24/22  0100  BP: 138/65 102/62 132/63  Will continue patient on amlodipine 5 mg, and as needed hydralazine.  >>AKI on CKD stage 3a: Lab Results  Component Value Date   CREATININE 1.30 (H) 12/23/2022   CREATININE 1.06 (H) 09/11/2021   CREATININE 0.97 08/31/2021  2/2 due to dehydration hold patient's HCTZ.  Will currently hold patient's quinapril.  Metformin.  HCTZ.  Continue patient on amlodipine and.  Hydralazine   >>Leucocytosis: 2/2 bronchitis, will start pt on azithromycin.  Prn albuterol.   >>DM II; Soft diet. Glycemic protocol.    DVT prophylaxis:  Heparin gtt.   Consults:  Vascular consult.  Advance Care Planning:    Code Status: Full Code   Family Communication:  None   Disposition Plan:  SNF.  Severity of Illness: The appropriate patient status for this patient is INPATIENT. Inpatient status is judged to be reasonable and necessary in order to provide the required intensity of service to ensure the patient's safety. The patient's presenting symptoms, physical exam findings, and initial radiographic and laboratory data in the context of their chronic comorbidities is felt to place them at high risk for further clinical deterioration. Furthermore, it is not anticipated that the patient will be medically stable for discharge from the hospital within 2 midnights of admission.   * I certify that at the point of admission it is my clinical judgment that the patient will require inpatient hospital care spanning beyond 2 midnights from the point of admission due to high intensity of service, high risk for further deterioration  and high frequency of surveillance required.*  Author: Gertha Calkin, MD 12/24/2022 1:55 AM  For on call review [email or web address].

## 2022-12-24 NOTE — Progress Notes (Signed)
PHARMACY - ANTICOAGULATION CONSULT NOTE  Pharmacy Consult for Heparin  Indication: pulmonary embolus  Allergies  Allergen Reactions   Fish Allergy    Shellfish Allergy Swelling    Patient Measurements: Height: 5\' 3"  (160 cm) Weight: 60.1 kg (132 lb 6.4 oz) IBW/kg (Calculated) : 52.4 Heparin Dosing Weight: 61 kg   Vital Signs: Temp: 98.1 F (36.7 C) (10/10 1417) Temp Source: Oral (10/10 1417) BP: 133/69 (10/10 1417) Pulse Rate: 71 (10/10 1444)  Labs: Recent Labs    12/23/22 1645 12/23/22 1946 12/23/22 2228 12/24/22 0413 12/24/22 0610 12/24/22 1459  HGB 13.0  --   --  11.9*  --   --   HCT 38.3  --   --  35.7*  --   --   PLT 238  --   --  218  --   --   APTT  --   --  27  --   --   --   LABPROT  --   --  14.6  --   --   --   INR  --   --  1.1  --   --   --   HEPARINUNFRC  --   --   --   --  0.49 0.61  CREATININE 1.30*  --   --  1.27*  --   --   TROPONINIHS 15 20*  --   --   --   --     Estimated Creatinine Clearance: 21.9 mL/min (A) (by C-G formula based on SCr of 1.27 mg/dL (H)).   Medical History: Past Medical History:  Diagnosis Date   Breast cancer (HCC) 2010   RT LUMPECTOMY   Diabetes (HCC)    Diverticulosis    Hypercholesterolemia    Hypertension    Intention tremor    Personal history of chemotherapy 2010   BREAST CA   Personal history of radiation therapy 2010   BREAST CA    Medications:  Medications Prior to Admission  Medication Sig Dispense Refill Last Dose   albuterol (VENTOLIN HFA) 108 (90 Base) MCG/ACT inhaler Inhale 1-2 puffs into the lungs every 4 (four) hours as needed for wheezing or shortness of breath.   prn at prn   amLODipine (NORVASC) 5 MG tablet Take 5 mg by mouth daily.   Unknown at Unknown   Calcium Carbonate-Vitamin D3 600-400 MG-UNIT TABS Take 1 tablet by mouth daily.   Unknown at Unknown   cholecalciferol (VITAMIN D3) 25 MCG (1000 UNIT) tablet Take 1,000 Units by mouth daily.   Unknown at Unknown   fexofenadine (ALLEGRA)  180 MG tablet Take 180 mg by mouth daily.   Unknown at Unknown   hydrochlorothiazide (HYDRODIURIL) 12.5 MG tablet Take 12.5 mg by mouth daily.   Unknown at Unknown   metoprolol succinate (TOPROL-XL) 25 MG 24 hr tablet Take 25 mg by mouth daily.   Unknown at Unknown   acetaminophen (TYLENOL) 325 MG tablet Take 2 tablets (650 mg total) by mouth every 6 (six) hours as needed for mild pain, fever or headache. (Patient not taking: Reported on 12/24/2022) 60 tablet 0 Not Taking    ASSESSMENT: 89 y.o. female with PMH HTN, T2DM, CKD3a is presenting with chest pain. Diagnosed with PE with questionable CT evidence of right heart strain (RV/LV Ratio = 1.25). Patient is not on chronic anticoagulation per chart review. Pharmacy has been consulted to initiate and manage heparin intravenous infusion.  Goal of Therapy:  Heparin level 0.3-0.7 units/ml Monitor platelets by  anticoagulation protocol: Yes  Date Time aPTT/HL Rate/Comment 1010 0649 HL 0.49 Therapeutic x 1 1010 1459 HL 0.61 Therapeutic x 2    Plan: Continue heparin infusion at 1000 units/hour. Check HL with AM labs Continue to monitor CBC daily while on heparin infusion.  Will M. Dareen Piano, PharmD Clinical Pharmacist 12/24/2022 4:46 PM

## 2022-12-24 NOTE — TOC Initial Note (Signed)
Transition of Care [facility]) - Initial/Assessment Note    Patient Details  Name: Nancy Zhang MRN: [id number] Date of Birth: 10-29-1925  Transition of Care [facility]) CM/SW Contact:    Marquita Palms, LCSW Phone Number: 12/24/2022, 8:27 AM  Clinical Narrative:                  CSW completed Health Risk Assessment. Patient is with [facility]; the nurse from [facility] named Agustin Cree reported that they found out last night. She reported that she would like an update if her situation changes and upon discharge. Awaiting further information         Patient Goals and CMS Choice            Expected Discharge Plan and Services                                              Prior Living Arrangements/Services                       Activities of Daily Living      Permission Sought/Granted                  Emotional Assessment              Admission diagnosis:  Chest pain [R07.9] Patient Active Problem List   Diagnosis Date Noted   Acute pulmonary embolism (HCC) 12/24/2022   Chest pain 12/24/2022   Status post closed fracture of left femur 08/16/2018   Closed left hip fracture (HCC) 07/07/2018   Osteoporosis 12/21/2017   Primary cancer of 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 [place] [address] Phone: [phone] Fax: [phone]  [facility] [place], [place] - [address] [place] [address] Phone: [phone] Fax:  [phone]     Social Determinants of Health (SDOH) Social History: SDOH Screenings   Tobacco Use: Low Risk  (12/23/2022)   SDOH Interventions:     Readmission Risk Interventions     No data to display

## 2022-12-24 NOTE — ED Notes (Signed)
Pt resting comfortably with eyes closed at this time. Respirations even and unlabored. NAD

## 2022-12-24 NOTE — Progress Notes (Signed)
PROGRESS NOTE    Nancy Zhang  ZOX:[id number] DOB: 06-29-1925 DOA: 12/23/2022 [facility]CP[facility]  [id number]  LOS: 0 days   Brief hospital course:   Assessment & Plan: Nancy Zhang is a 89 y.o. female with medical history significant for  hypertension, diabetes mellitus type 2, CKD stage III a,presenting with chest pain, pt is oriented to self and place.  Presented today after being seen by home health nurse for chest pain.   Acute Pulmonary Embolism: --saddle PE with right heart strain. --started on heparin gtt Plan: --pulmonary thrombectomy tomorrow --cont heparin gtt  Hyponatremia --improved  Hypokalemia --monitor and supplement PRN  >>HTN: --cont amlodipine   >>AKI, ruled out --does not meet criteria  CKD 3b  Bronchiolitis  --seen on CTA chest.  abx not indicated.  d/c azithromycin.     >>DM II --ACHS and SSI   DVT prophylaxis: WJ:[id number] gtt Code Status: Full code  Family Communication: daughter updated at bedside today Level of care: Progressive Dispo:   The patient is from: home Anticipated d/c is to: to be determined Anticipated d/c date is: 2-3 days   Subjective and Interval History:  Pt reported chest pain improved.     Objective: Vitals:   12/24/22 1324 12/24/22 1417 12/24/22 1444 12/24/22 1720  BP: (!) 116/50 133/69  (!) 121/43  Pulse: 68 78 71 73  Resp: 20  17 20   Temp: 98.2 F (36.8 C) 98.1 F (36.7 C)  98.2 F (36.8 C)  TempSrc: Oral Oral  Oral  SpO2: 94% 98% 97% 96%  Weight:  60.1 kg    Height:        Intake/Output Summary (Last 24 hours) at 12/24/2022 1722 Last data filed at 12/24/2022 0923 Gross per 24 hour  Intake 429.66 ml  Output 300 ml  Net 129.66 ml   Filed Weights   12/23/22 1642 12/24/22 1417  Weight: 61 kg 60.1 kg    Examination:   Constitutional: NAD, AAOx3 HEENT: conjunctivae and lids normal, EOMI CV: No cyanosis.   RESP: normal respiratory effort Neuro: II - XII grossly  intact.   Psych: Normal mood and affect.     Data Reviewed: I have personally reviewed labs and imaging studies  No charge note.  Darlin Priestly, MD [facility] If 7PM-7AM, please contact night-coverage 12/24/2022, 5:22 PM

## 2022-12-24 NOTE — ED Notes (Signed)
Patient cleaned up by this RN and Elana RN after an episode of urinary incontinence. Patient placed in a new brief and a new paper chuck applied to the bed. Patient positioned comfortably in bed and bed placed in lowest position.

## 2022-12-24 NOTE — Inpatient Diabetes Management (Signed)
Inpatient Diabetes Program Recommendations  AACE/ADA: New Consensus Statement on Inpatient Glycemic Control   Target Ranges:  Prepandial:   less than 140 mg/dL      Peak postprandial:   less than 180 mg/dL (1-2 hours)      Critically ill patients:  140 - 180 mg/dL    Latest Reference Range & Units 12/23/22 16:45 12/24/22 04:13  Glucose 70 - 99 mg/dL 409 (H) 811 (H)  Hemoglobin A1C 4.8 - 5.6 %  7.1 (H)    Review of Glycemic Control  Diabetes history: DM2 Outpatient Diabetes medications: None Current orders for Inpatient glycemic control: None  Inpatient Diabetes Program Recommendations:    Insulin: Please consider ordering CBGs and Novolog 0-9 units TID with meals and Novolog 0-5 units at bedtime.  NOTE: Patient admitted from SNF with chest pain. Per H&P, patient was given Solumedrol 125 mg x1 by EMS prior to arrival. Lab glucose 319 mg/dl today.  Thanks, Orlando Penner, RN, MSN, CDCES Diabetes Coordinator Inpatient Diabetes Program [phone] (Team Pager from 8am to 5pm)

## 2022-12-25 ENCOUNTER — Encounter
Admission: EM | Disposition: A | Discharge status: Home Health | Payer: Self-pay | Source: Home / Self Care | Attending: Hospitalist

## 2022-12-25 ENCOUNTER — Inpatient Hospital Stay (HOSPITAL_COMMUNITY)
Admit: 2022-12-25 | Discharge: 2022-12-25 | Disposition: A | Discharge status: Home / Self Care | Payer: Medicare (Managed Care) | Attending: Internal Medicine | Admitting: Internal Medicine

## 2022-12-25 DIAGNOSIS — I2699 Other pulmonary embolism without acute cor pulmonale: Secondary | ICD-10-CM

## 2022-12-25 DIAGNOSIS — F0390 Unspecified dementia without behavioral disturbance: Secondary | ICD-10-CM | POA: Diagnosis not present

## 2022-12-25 DIAGNOSIS — I10 Essential (primary) hypertension: Secondary | ICD-10-CM

## 2022-12-25 DIAGNOSIS — I2602 Saddle embolus of pulmonary artery with acute cor pulmonale: Secondary | ICD-10-CM | POA: Diagnosis not present

## 2022-12-25 DIAGNOSIS — I2609 Other pulmonary embolism with acute cor pulmonale: Secondary | ICD-10-CM

## 2022-12-25 DIAGNOSIS — Z8781 Personal history of (healed) traumatic fracture: Secondary | ICD-10-CM | POA: Diagnosis not present

## 2022-12-25 HISTORY — PX: PULMONARY THROMBECTOMY: CATH118295

## 2022-12-25 LAB — ECHOCARDIOGRAM COMPLETE BUBBLE STUDY
AR max vel: 2.23 cm2
AV Area VTI: 2.31 cm2
AV Area mean vel: 1.99 cm2
AV Mean grad: 4 mm[Hg]
AV Peak grad: 6.8 mm[Hg]
Ao pk vel: 1.31 m/s
Area-P 1/2: 4.49 cm2
MV VTI: 3.63 cm2
S' Lateral: 2.3 cm

## 2022-12-25 LAB — BASIC METABOLIC PANEL
Anion gap: 15 (ref 5–15)
BUN: 32 mg/dL — ABNORMAL HIGH (ref 8–23)
CO2: 27 mmol/L (ref 22–32)
Calcium: 9 mg/dL (ref 8.9–10.3)
Chloride: 96 mmol/L — ABNORMAL LOW (ref 98–111)
Creatinine, Ser: 1.44 mg/dL — ABNORMAL HIGH (ref 0.44–1.00)
GFR, Estimated: 33 mL/min — ABNORMAL LOW (ref >60)
Glucose, Bld: 138 mg/dL — ABNORMAL HIGH (ref 70–99)
Potassium: 3.7 mmol/L (ref 3.5–5.1)
Sodium: 138 mmol/L (ref 135–145)

## 2022-12-25 LAB — CBC
HCT: 35.5 % — ABNORMAL LOW (ref 36.0–46.0)
Hemoglobin: 11.8 g/dL — ABNORMAL LOW (ref 12.0–15.0)
MCH: 28 pg (ref 26.0–34.0)
MCHC: 33.2 g/dL (ref 30.0–36.0)
MCV: 84.1 fL (ref 80.0–100.0)
Platelets: 238 10*3/uL (ref 150–400)
RBC: 4.22 MIL/uL (ref 3.87–5.11)
RDW: 13.9 % (ref 11.5–15.5)
WBC: 16.4 10*3/uL — ABNORMAL HIGH (ref 4.0–10.5)
nRBC: 0 % (ref 0.0–0.2)

## 2022-12-25 LAB — GLUCOSE, CAPILLARY
Glucose-Capillary: 135 mg/dL — ABNORMAL HIGH (ref 70–99)
Glucose-Capillary: 129 mg/dL — ABNORMAL HIGH (ref 70–99)
Glucose-Capillary: 171 mg/dL — ABNORMAL HIGH (ref 70–99)
Glucose-Capillary: 255 mg/dL — ABNORMAL HIGH (ref 70–99)

## 2022-12-25 LAB — HEPARIN LEVEL (UNFRACTIONATED): Heparin Unfractionated: 0.7 [IU]/mL (ref 0.30–0.70)

## 2022-12-25 LAB — MAGNESIUM: Magnesium: 1.9 mg/dL (ref 1.7–2.4)

## 2022-12-25 SURGERY — PULMONARY THROMBECTOMY
Anesthesia: Moderate Sedation | Laterality: Bilateral

## 2022-12-25 MED ORDER — HEPARIN (PORCINE) IN NACL 1000-0.9 UT/500ML-% IV SOLN
INTRAVENOUS | Status: DC | PRN
  Administered 2022-12-25: 1000 mL

## 2022-12-25 MED ORDER — METHYLPREDNISOLONE SODIUM SUCC 125 MG IJ SOLR: 125.0000 mg | Freq: Once | INTRAMUSCULAR | Status: AC | PRN

## 2022-12-25 MED ORDER — FAMOTIDINE 20 MG PO TABS: 40.0000 mg | ORAL_TABLET | Freq: Once | ORAL | Status: AC | PRN

## 2022-12-25 MED ORDER — HYDROMORPHONE HCL 1 MG/ML IJ SOLN: 1.0000 mg | Freq: Once | INTRAMUSCULAR | Status: DC | PRN

## 2022-12-25 MED ORDER — FAMOTIDINE 20 MG PO TABS
ORAL_TABLET | ORAL | Status: AC
  Administered 2022-12-25: 40 mg via ORAL
  Filled 2022-12-25: qty 2

## 2022-12-25 MED ORDER — IODIXANOL 320 MG/ML IV SOLN
INTRAVENOUS | Status: DC | PRN
  Administered 2022-12-25: 55 mL

## 2022-12-25 MED ORDER — ONDANSETRON HCL 4 MG/2ML IJ SOLN: 4.0000 mg | Freq: Four times a day (QID) | INTRAMUSCULAR | Status: DC | PRN

## 2022-12-25 MED ORDER — MIDAZOLAM HCL 2 MG/2ML IJ SOLN
INTRAMUSCULAR | Status: DC | PRN
  Administered 2022-12-25: 1 mg via INTRAVENOUS
  Administered 2022-12-25 (×2): .5 mg via INTRAVENOUS

## 2022-12-25 MED ORDER — FENTANYL CITRATE PF 50 MCG/ML IJ SOSY
PREFILLED_SYRINGE | INTRAMUSCULAR | Status: AC
  Filled 2022-12-25: qty 1

## 2022-12-25 MED ORDER — FENTANYL CITRATE PF 50 MCG/ML IJ SOSY: 12.5000 ug | PREFILLED_SYRINGE | Freq: Once | INTRAMUSCULAR | Status: DC | PRN

## 2022-12-25 MED ORDER — CEFAZOLIN SODIUM-DEXTROSE 2-4 GM/100ML-% IV SOLN
INTRAVENOUS | Status: AC
  Filled 2022-12-25: qty 100

## 2022-12-25 MED ORDER — MIDAZOLAM HCL 2 MG/2ML IJ SOLN
INTRAMUSCULAR | Status: AC
  Filled 2022-12-25: qty 2

## 2022-12-25 MED ORDER — DIPHENHYDRAMINE HCL 50 MG/ML IJ SOLN
INTRAMUSCULAR | Status: AC
  Administered 2022-12-25: 25 mg via INTRAVENOUS
  Filled 2022-12-25: qty 1

## 2022-12-25 MED ORDER — LIDOCAINE-EPINEPHRINE (PF) 1 %-1:200000 IJ SOLN
INTRAMUSCULAR | Status: DC | PRN
  Administered 2022-12-25: 10 mL via INTRADERMAL

## 2022-12-25 MED ORDER — HEPARIN SODIUM (PORCINE) 1000 UNIT/ML IJ SOLN
INTRAMUSCULAR | Status: DC | PRN
  Administered 2022-12-25: 3000 [IU] via INTRAVENOUS

## 2022-12-25 MED ORDER — HEPARIN SODIUM (PORCINE) 1000 UNIT/ML IJ SOLN
INTRAMUSCULAR | Status: AC
  Filled 2022-12-25: qty 10

## 2022-12-25 MED ORDER — DIPHENHYDRAMINE HCL 50 MG/ML IJ SOLN: 50.0000 mg | Freq: Once | INTRAMUSCULAR | Status: AC | PRN

## 2022-12-25 MED ORDER — METHYLPREDNISOLONE SODIUM SUCC 125 MG IJ SOLR
INTRAMUSCULAR | Status: AC
  Administered 2022-12-25: 125 mg via INTRAVENOUS
  Filled 2022-12-25: qty 2

## 2022-12-25 MED ORDER — FENTANYL CITRATE (PF) 100 MCG/2ML IJ SOLN
INTRAMUSCULAR | Status: DC | PRN
  Administered 2022-12-25: 25 ug via INTRAVENOUS
  Administered 2022-12-25: 12.5 ug via INTRAVENOUS

## 2022-12-25 MED ORDER — SODIUM CHLORIDE 0.9 % IV SOLN: INTRAVENOUS | Status: DC

## 2022-12-25 MED ORDER — MIDAZOLAM HCL 2 MG/ML PO SYRP
8.0000 mg | ORAL_SOLUTION | Freq: Once | ORAL | Status: DC | PRN
  Filled 2022-12-25: qty 5

## 2022-12-25 MED ORDER — CEFAZOLIN SODIUM-DEXTROSE 2-4 GM/100ML-% IV SOLN
2.0000 g | INTRAVENOUS | Status: AC
  Administered 2022-12-25: 2 g via INTRAVENOUS
  Filled 2022-12-25: qty 100

## 2022-12-25 SURGICAL SUPPLY — 16 items
CANISTER PENUMBRA ENGINE (MISCELLANEOUS) IMPLANT
CATH ANGIO 5F PIGTAIL 100CM (CATHETERS) IMPLANT
CATH INDIGO 12XTORQ 100 (CATHETERS) IMPLANT
CATH INDIGO SEP 12 (CATHETERS) IMPLANT
CATH SELECT BERN TIP 5F 130 (CATHETERS) IMPLANT
CLOSURE PERCLOSE PROSTYLE (VASCULAR PRODUCTS) IMPLANT
COVER PROBE ULTRASOUND 5X96 (MISCELLANEOUS) IMPLANT
GLIDEWIRE ADV .035X180CM (WIRE) IMPLANT
PACK ANGIOGRAPHY (CUSTOM PROCEDURE TRAY) ×1 IMPLANT
SHEATH BRITE TIP 6FRX11 (SHEATH) IMPLANT
SHEATH CHCK-FLO 14FR 13 (SHEATH) IMPLANT
SUT MNCRL AB 4-0 PS2 18 (SUTURE) IMPLANT
SYR MEDRAD MARK 7 150ML (SYRINGE) IMPLANT
TUBING CONTRAST HIGH PRESS 72 (TUBING) IMPLANT
WIRE GUIDERIGHT .035X150 (WIRE) IMPLANT
WIRE SUPRACORE 300CM (WIRE) IMPLANT

## 2022-12-25 NOTE — Progress Notes (Signed)
PHARMACY - ANTICOAGULATION CONSULT NOTE  Pharmacy Consult for Heparin  Indication: pulmonary embolus  Allergies  Allergen Reactions   Fish Allergy    Shellfish Allergy Swelling    Patient Measurements: Height: 5\' 3"  (160 cm) Weight: 60.1 kg (132 lb 6.4 oz) IBW/kg (Calculated) : 52.4 Heparin Dosing Weight: 61 kg   Vital Signs: Temp: 98.7 F (37.1 C) (10/10 2027) Temp Source: Oral (10/10 2027) BP: 148/75 (10/11 0112) Pulse Rate: 70 (10/11 0112)  Labs: Recent Labs    12/23/22 1645 12/23/22 1946 12/23/22 2228 12/24/22 0413 12/24/22 0610 12/24/22 1459 12/25/22 0202  HGB 13.0  --   --  11.9*  --   --  11.8*  HCT 38.3  --   --  35.7*  --   --  35.5*  PLT 238  --   --  218  --   --  238  APTT  --   --  27  --   --   --   --   LABPROT  --   --  14.6  --   --   --   --   INR  --   --  1.1  --   --   --   --   HEPARINUNFRC  --   --   --   --  0.49 0.61 0.70  CREATININE 1.30*  --   --  1.27*  --   --  1.44*  TROPONINIHS 15 20*  --   --   --   --   --     Estimated Creatinine Clearance: 19.3 mL/min (A) (by C-G formula based on SCr of 1.44 mg/dL (H)).   Medical History: Past Medical History:  Diagnosis Date   Breast cancer (HCC) 2010   RT LUMPECTOMY   Diabetes (HCC)    Diverticulosis    Hypercholesterolemia    Hypertension    Intention tremor    Personal history of chemotherapy 2010   BREAST CA   Personal history of radiation therapy 2010   BREAST CA    Medications:  Medications Prior to Admission  Medication Sig Dispense Refill Last Dose   albuterol (VENTOLIN HFA) 108 (90 Base) MCG/ACT inhaler Inhale 1-2 puffs into the lungs every 4 (four) hours as needed for wheezing or shortness of breath.   prn at prn   amLODipine (NORVASC) 5 MG tablet Take 5 mg by mouth daily.   Unknown at Unknown   Calcium Carbonate-Vitamin D3 600-400 MG-UNIT TABS Take 1 tablet by mouth daily.   Unknown at Unknown   cholecalciferol (VITAMIN D3) 25 MCG (1000 UNIT) tablet Take 1,000 Units  by mouth daily.   Unknown at Unknown   fexofenadine (ALLEGRA) 180 MG tablet Take 180 mg by mouth daily.   Unknown at Unknown   hydrochlorothiazide (HYDRODIURIL) 12.5 MG tablet Take 12.5 mg by mouth daily.   Unknown at Unknown   metoprolol succinate (TOPROL-XL) 25 MG 24 hr tablet Take 25 mg by mouth daily.   Unknown at Unknown   acetaminophen (TYLENOL) 325 MG tablet Take 2 tablets (650 mg total) by mouth every 6 (six) hours as needed for mild pain, fever or headache. (Patient not taking: Reported on 12/24/2022) 60 tablet 0 Not Taking    ASSESSMENT: 89 y.o. female with PMH HTN, T2DM, CKD3a is presenting with chest pain. Diagnosed with PE with questionable CT evidence of right heart strain (RV/LV Ratio = 1.25). Patient is not on chronic anticoagulation per chart review. Pharmacy has been consulted  to initiate and manage heparin intravenous infusion.  Goal of Therapy:  Heparin level 0.3-0.7 units/ml Monitor platelets by anticoagulation protocol: Yes  Date Time aPTT/HL Rate/Comment 1010 0649 HL 0.49 Therapeutic x 1 1010 1459 HL 0.61 Therapeutic x 2 1011    0202    HL 0.70           Therapeutic X 3     Plan: Continue heparin infusion at 1000 units/hour. Check HL with AM labs Continue to monitor CBC daily while on heparin infusion.  Dawson Albers D Clinical Pharmacist 12/25/2022 2:58 AM

## 2022-12-25 NOTE — Progress Notes (Signed)
*  PRELIMINARY RESULTS* Echocardiogram 2D Echocardiogram has been performed.  Carolyne Fiscal 12/25/2022, 10:46 AM

## 2022-12-25 NOTE — Op Note (Signed)
[facility]  Percutaneous Study/Intervention Procedural Note   Date of Surgery: 12/25/2022,3:18 PM  Surgeon: Festus Barren  Pre-operative Diagnosis: Symptomatic bilateral pulmonary emboli  Post-operative diagnosis:  Same  Procedure(s) Performed:  1.  Contrast injection right heart  2.  Mechanical thrombectomy using the penumbra CAT 12 catheter to the right lower lobe, middle lobe, and upper lobe pulmonary arteries in the left upper and lower lobe pulmonary arteries  3.  Selective catheter placement right lower lobe, middle lobe, and upper lobe pulmonary arteries             4.  Selective catheter placement left lower lobe and upper lobe pulmonary arteries    Anesthesia: Conscious sedation was administered under my direct supervision by the interventional radiology RN. IV Versed plus fentanyl were utilized. Continuous ECG, pulse oximetry and blood pressure was monitored throughout the entire procedure.  Versed and fentanyl were administered intravenously.  Conscious sedation was administered for a total of 31 minutes using 2 mg of Versed and 37.5 mcg of Fentanyl.  EBL: 350 cc  Sheath: 14 French right femoral vein  Contrast: 55 cc   Fluoroscopy Time: 7 minutes  Indications:  Patient presents with pulmonary emboli. The patient is symptomatic with hypoxemia and dyspnea on exertion.  There is evidence of right heart strain on the CT angiogram. The patient is otherwise a good candidate for intervention and even the long-term benefits pulmonary angiography with thrombolysis is offered. The risks and benefits are reviewed long-term benefits are discussed. All questions are answered patient agrees to proceed.  Procedure:  Nancy Zhang a 89 y.o. female who was identified and appropriate procedural time out was performed.  The patient was then placed supine on the table and prepped and draped in the usual sterile fashion.  Ultrasound was used to evaluate the right  common femoral vein.  It was patent, as it was echolucent and compressible.  A digital ultrasound image was acquired for the permanent record.  A Seldinger needle was used to access the right common femoral vein under direct ultrasound guidance.  A 0.035 J wire was advanced without resistance and a 6Fr sheath was placed. A proglide device was placed in a preclose fashion and then upsized to a 14 [place] sheath.    The Advantage wire and pigtail catheter were then negotiated into the right atrium and bolus injection of contrast was utilized to demonstrate the right ventricle and the pulmonary artery outflow. The Advantage wire and catheter were then negotiated into the the pulmonary arteries.  The left pulmonary artery was cannulated and advanced into the left upper lobe and left lower lobe for selective imaging. This demonstrated significant thrombus burden in both the left upper and lower lobe pulmonary arteries.  I then transitioned to the right side with the advantage wire and catheter and first cannulated the right lower lobe and then the right middle and upper lobes.  This demonstrated significant thrombus burden most notable in the middle and upper lobe pulmonary arteries with a lesser amount of thrombus in the right lower lobe pulmonary artery.  3000 Units of heparin was then given and allowed to circulate.   The Penumbra Cat 12 catheter was then advanced up into the pulmonary vasculature. The right lung was addressed first. Catheter was negotiated into the right lower lobe pulmonary artery and mechanical thrombectomy was performed with the help of the separator. Follow-up imaging demonstrated a good result and therefore the catheter was renegotiated into the right middle  lobe pulmonary artery and again mechanical thrombectomy was performed. Passes were made with both the Penumbra catheter itself as well as introducing the separator.  Finally, I was able to get the catheter up into the right upper lobe  pulmonary artery and perform thrombectomy with the separator.  Follow-up imaging was then performed.  Improvement was seen so we turned our attention to the left side.  The Penumbra Cat 12 catheter was then negotiated to the opposite side. The left lung was then addressed. Catheter was negotiated into the left upper lobe and mechanical thrombectomy was performed with the separator. Follow-up imaging demonstrated a good result and therefore the catheter was renegotiated into the left lower lobe pulmonary artery and again mechanical thrombectomy was performed. Passes were made with both the Penumbra catheter itself as well as introducing the separator. Follow-up imaging was then performed.  After review these images wires were reintroduced and the catheter is removed. Then, the sheath is then pulled, the proglide device is secured, a Monocryl skin suture was placed and pressure is held. A sterile dressing is placed.    Findings:   Right heart imaging:  Right atrium and right ventricle and the pulmonary outflow tract appears mildly dilated  Right lung:  This demonstrated significant thrombus burden most notable in the middle and upper lobe pulmonary arteries with a lesser amount of thrombus in the right lower lobe pulmonary artery.  Left lung:  This demonstrated significant thrombus burden in both the left upper and lower lobe pulmonary arteries.    Disposition: Patient was taken to the recovery room in stable condition having tolerated the procedure well.  Vona Whiters 12/25/2022,3:18 PM

## 2022-12-25 NOTE — Interval H&P Note (Signed)
History and Physical Interval Note:  12/25/2022 1:49 PM  Nancy Zhang  has presented today for surgery, with the diagnosis of Pulmonary embolism Saddle PE.  The various methods of treatment have been discussed with the patient and family. After consideration of risks, benefits and other options for treatment, the patient has consented to  Procedure(s): PULMONARY THROMBECTOMY (Bilateral) as a surgical intervention.  The patient's history has been reviewed, patient examined, no change in status, stable for surgery.  I have reviewed the patient's chart and labs.  Questions were answered to the patient's satisfaction.     Festus Barren

## 2022-12-26 DIAGNOSIS — I2602 Saddle embolus of pulmonary artery with acute cor pulmonale: Secondary | ICD-10-CM | POA: Diagnosis not present

## 2022-12-26 LAB — BASIC METABOLIC PANEL
Anion gap: 11 (ref 5–15)
BUN: 33 mg/dL — ABNORMAL HIGH (ref 8–23)
CO2: 29 mmol/L (ref 22–32)
Calcium: 8.3 mg/dL — ABNORMAL LOW (ref 8.9–10.3)
Chloride: 98 mmol/L (ref 98–111)
Creatinine, Ser: 1.28 mg/dL — ABNORMAL HIGH (ref 0.44–1.00)
GFR, Estimated: 39 mL/min — ABNORMAL LOW (ref >60)
Glucose, Bld: 202 mg/dL — ABNORMAL HIGH (ref 70–99)
Potassium: 3.8 mmol/L (ref 3.5–5.1)
Sodium: 138 mmol/L (ref 135–145)

## 2022-12-26 LAB — GLUCOSE, CAPILLARY
Glucose-Capillary: 196 mg/dL — ABNORMAL HIGH (ref 70–99)
Glucose-Capillary: 203 mg/dL — ABNORMAL HIGH (ref 70–99)
Glucose-Capillary: 181 mg/dL — ABNORMAL HIGH (ref 70–99)
Glucose-Capillary: 151 mg/dL — ABNORMAL HIGH (ref 70–99)

## 2022-12-26 LAB — CBC
HCT: 34.1 % — ABNORMAL LOW (ref 36.0–46.0)
Hemoglobin: 11.3 g/dL — ABNORMAL LOW (ref 12.0–15.0)
MCH: 27.8 pg (ref 26.0–34.0)
MCHC: 33.1 g/dL (ref 30.0–36.0)
MCV: 84 fL (ref 80.0–100.0)
Platelets: 218 10*3/uL (ref 150–400)
RBC: 4.06 MIL/uL (ref 3.87–5.11)
RDW: 13.6 % (ref 11.5–15.5)
WBC: 7.7 10*3/uL (ref 4.0–10.5)
nRBC: 0 % (ref 0.0–0.2)

## 2022-12-26 LAB — HEPARIN LEVEL (UNFRACTIONATED): Heparin Unfractionated: 0.86 [IU]/mL — ABNORMAL HIGH (ref 0.30–0.70)

## 2022-12-26 LAB — MAGNESIUM: Magnesium: 1.9 mg/dL (ref 1.7–2.4)

## 2022-12-26 MED ORDER — APIXABAN 5 MG PO TABS: ORAL_TABLET | ORAL | 6 refills | Status: AC

## 2022-12-26 MED ORDER — APIXABAN 5 MG PO TABS
10.0000 mg | ORAL_TABLET | Freq: Two times a day (BID) | ORAL | Status: DC
  Administered 2022-12-26 – 2022-12-27 (×3): 10 mg via ORAL
  Filled 2022-12-26 (×3): qty 2

## 2022-12-26 MED ORDER — APIXABAN 5 MG PO TABS: 5.0000 mg | ORAL_TABLET | Freq: Two times a day (BID) | ORAL | Status: DC

## 2022-12-26 NOTE — Progress Notes (Signed)
[facility]  PROGRESS NOTE  ASSESSMENT / PLAN: Nancy Zhang is a 89 y.o. female status post right endovascular pulmonary thrombectomy for symptomatic pulmonary embolism. Doing well. Groins soft and non-tender. No evidence of hematoma. OK for transition to [facility]. OK for discharge from my standpoint.  SUBJECTIVE: No complaints. Wants to go home  OBJECTIVE: BP (!) 132/41 (BP Location: Left Arm)   Pulse 79   Temp 97.6 F (36.4 C) (Oral)   Resp 16   Ht 5\' 3"  (1.6 m)   Wt 61.7 kg   SpO2 97%   BMI 24.10 kg/m   Intake/Output Summary (Last 24 hours) at 12/26/2022 0858 Last data filed at 12/26/2022 0100 Gross per 24 hour  Intake 454.25 ml  Output 800 ml  Net -345.75 ml    Elderly woman in no distress Regular rate and rhythm Unlabored breathing on [place] Groins soft without hematoma     Latest Ref Rng & Units 12/26/2022    3:34 AM 12/25/2022    2:02 AM 12/24/2022    4:13 AM  CBC  WBC 4.0 - 10.5 K/uL 7.7  16.4  10.6   Hemoglobin 12.0 - 15.0 g/dL 16.1  09.6  04.5   Hematocrit 36.0 - 46.0 % 34.1  35.5  35.7   Platelets 150 - 400 K/uL 218  238  218         Latest Ref Rng & Units 12/26/2022    3:34 AM 12/25/2022    2:02 AM 12/24/2022    4:13 AM  CMP  Glucose 70 - 99 mg/dL 409  811  914   BUN 8 - 23 mg/dL 33  32  26   Creatinine 0.44 - 1.00 mg/dL 7.82  9.56  2.13   Sodium 135 - 145 mmol/L 138  138  132   Potassium 3.5 - 5.1 mmol/L 3.8  3.7  3.3   Chloride 98 - 111 mmol/L 98  96  96   CO2 22 - 32 mmol/L 29  27  26    Calcium 8.9 - 10.3 mg/dL 8.3  9.0  8.6   Total Protein 6.5 - 8.1 g/dL   6.5   Total Bilirubin 0.3 - 1.2 mg/dL   0.7   Alkaline Phos 38 - 126 U/L   63   AST 15 - 41 U/L   23   ALT 0 - 44 U/L   13     Estimated Creatinine Clearance: 21.7 mL/min (A) (by C-G formula based on SCr of 1.28 mg/dL (H)).  Rande Brunt. Lenell Antu, MD [facility] Phone Number: [phone] 12/26/2022 8:58  AM

## 2022-12-26 NOTE — Progress Notes (Signed)
Pulse oximetry on room air while sitting is 97, HR 85.  Pulse oximetry on room air while walking dropped to  91%. HR 110 patient walked 150 feet.

## 2022-12-26 NOTE — Progress Notes (Signed)
PHARMACY - ANTICOAGULATION CONSULT NOTE  Pharmacy Consult for Heparin  Indication: pulmonary embolus  Allergies  Allergen Reactions   Fish Allergy    Shellfish Allergy Swelling    Patient Measurements: Height: 5\' 3"  (160 cm) Weight: 60.1 kg (132 lb 6.4 oz) IBW/kg (Calculated) : 52.4 Heparin Dosing Weight: 61 kg   Vital Signs: Temp: 98.4 F (36.9 C) (10/11 2126) BP: 124/62 (10/11 2338) Pulse Rate: 57 (10/11 2338)  Labs: Recent Labs    12/23/22 1645 12/23/22 1946 12/23/22 2228 12/24/22 0413 12/24/22 0610 12/24/22 1459 12/25/22 0202 12/26/22 0334  HGB 13.0  --   --  11.9*  --   --  11.8* 11.3*  HCT 38.3  --   --  35.7*  --   --  35.5* 34.1*  PLT 238  --   --  218  --   --  238 218  APTT  --   --  27  --   --   --   --   --   LABPROT  --   --  14.6  --   --   --   --   --   INR  --   --  1.1  --   --   --   --   --   HEPARINUNFRC  --   --   --   --    < > 0.61 0.70 0.86*  CREATININE 1.30*  --   --  1.27*  --   --  1.44* 1.28*  TROPONINIHS 15 20*  --   --   --   --   --   --    < > = values in this interval not displayed.    Estimated Creatinine Clearance: 21.7 mL/min (A) (by C-G formula based on SCr of 1.28 mg/dL (H)).   Medical History: Past Medical History:  Diagnosis Date   Breast cancer (HCC) 2010   RT LUMPECTOMY   Diabetes (HCC)    Diverticulosis    Hypercholesterolemia    Hypertension    Intention tremor    Personal history of chemotherapy 2010   BREAST CA   Personal history of radiation therapy 2010   BREAST CA    Medications:  Medications Prior to Admission  Medication Sig Dispense Refill Last Dose   albuterol (VENTOLIN HFA) 108 (90 Base) MCG/ACT inhaler Inhale 1-2 puffs into the lungs every 4 (four) hours as needed for wheezing or shortness of breath.   prn at prn   amLODipine (NORVASC) 5 MG tablet Take 5 mg by mouth daily.   Unknown at Unknown   Calcium Carbonate-Vitamin D3 600-400 MG-UNIT TABS Take 1 tablet by mouth daily.   Unknown at  Unknown   cholecalciferol (VITAMIN D3) 25 MCG (1000 UNIT) tablet Take 1,000 Units by mouth daily.   Unknown at Unknown   fexofenadine (ALLEGRA) 180 MG tablet Take 180 mg by mouth daily.   Unknown at Unknown   hydrochlorothiazide (HYDRODIURIL) 12.5 MG tablet Take 12.5 mg by mouth daily.   Unknown at Unknown   metoprolol succinate (TOPROL-XL) 25 MG 24 hr tablet Take 25 mg by mouth daily.   Unknown at Unknown   acetaminophen (TYLENOL) 325 MG tablet Take 2 tablets (650 mg total) by mouth every 6 (six) hours as needed for mild pain, fever or headache. (Patient not taking: Reported on 12/24/2022) 60 tablet 0 Not Taking    ASSESSMENT: 89 y.o. female with PMH HTN, T2DM, CKD3a is presenting with chest pain.  Diagnosed with PE with questionable CT evidence of right heart strain (RV/LV Ratio = 1.25). Patient is not on chronic anticoagulation per chart review. Pharmacy has been consulted to initiate and manage heparin intravenous infusion.  Goal of Therapy:  Heparin level 0.3-0.7 units/ml Monitor platelets by anticoagulation protocol: Yes  Date Time aPTT/HL Rate/Comment 1010 0649 HL 0.49 Therapeutic x 1 1010 1459 HL 0.61 Therapeutic x 2 1011    0202    HL 0.70           Therapeutic X 3  1012    0334    HL 0.86           elevated    Plan: 10/12:  HL @ 0334 = 0.86, elevated - Will decrease heparin drip rate to 900 units/hr and recheck HL 8 hrs after rate change   Aurthur Wingerter D Clinical Pharmacist 12/26/2022 5:06 AM

## 2022-12-26 NOTE — Progress Notes (Signed)
    Durable Medical Equipment  (From admission, onward)           Start     Ordered   12/26/22 1441  For home use only DME Bedside commode  Once       Question:  Patient needs a bedside commode to treat with the following condition  Answer:  Weakness   12/26/22 1440

## 2022-12-26 NOTE — Plan of Care (Signed)
  Problem: Skin Integrity: Goal: Risk for impaired skin integrity will decrease Outcome: Progressing   Problem: Education: Goal: Knowledge of General Education information will improve Description: Including pain rating scale, medication(s)/side effects and non-pharmacologic comfort measures Outcome: Progressing   Problem: Clinical Measurements: Goal: Will remain free from infection Outcome: Progressing   Problem: Safety: Goal: Ability to remain free from injury will improve Outcome: Progressing

## 2022-12-26 NOTE — TOC Progression Note (Signed)
Transition of Care [facility]) - Progression Note    Patient Details  Name: Nancy Zhang MRN: [id number] Date of Birth: 11-05-1925  [facility]) CM/SW Contact  Bing Quarry, RN Phone Number: 12/26/2022, 3:43 PM  Clinical Narrative:   10/12: Patient to be discharge 12/27/22 to home address with [facility] picking up HH/DME needs per Dr Sheral Flow after she and hospitalist had a direct phone call  conversation. [facility] will have an RN out tomorrow as family is unable to stay with patient per daughter who lives in [place].   Gabriel Cirri MSN RN CM  Transitions of Care Department [facility] [phone] Weekends Only       Barriers to Discharge: Barriers Unresolved (comment) (Medical Director of [facility] program feels discharge may be premature given age/procedure as well as some logistics that need to be in place ahead of discharge. Family indicated to unit RN that other assistance would not be avail. till Mon. pt lives alone.)  Expected Discharge Plan and Services         Expected Discharge Date: 12/26/22               DME Arranged: N/A DME Agency: NA       HH Arranged: NA HH Agency: NA         Social Determinants of Health (SDOH) Interventions SDOH Screenings   Tobacco Use: Low Risk  (12/23/2022)    Readmission Risk Interventions     No data to display

## 2022-12-26 NOTE — Evaluation (Addendum)
Physical Therapy Evaluation Patient Details Name: Nancy Zhang MRN: [id number] DOB: 07-Dec-1925 Today's Date: 12/26/2022  History of Present Illness  Pt admitted to [facility] on 12/23/22 with c/o sharp mid-sternal chest pain with diminished breath sounds. Imaging significant for central PE with R main bronchus occlusion and L segmental bronchi occlusion. Anticoagulation therapy initiated 10/9 @ 2232. S/p endovascular pulmonary thrombectomy on 10/11. Significant PMH includes: diabetes, HTN, dementia, osteoporosis, chronic L hip fx, diverticulosis, hypercholesterolemia, and intention tremor.   Clinical Impression  Pt is a 89 year old F admitted to hospital on 12/23/22 for chest pain, found to have bilateral PE. PLOF confirmed by daughter at bedside due to pt baseline dementia. At baseline, pt receives assistance for IADL's, transportation, and bathing. She is able to be mod I with toileting, dressing, and ambulation with RW within the home. She [facility] to [facility] 3x[facility]wk.   Pt presents with decreased activity tolerance, intention tremor (baseline), and decreased standing balance resulting in impaired functional mobility from baseline. Due to deficits, pt required supervision for bed mobility, CGA for transfers, and SBA-CGA for safety to ambulate 106ft with RW. Labored breathing noted after upright functional mobility with SpO2 >/= 91% throughout session and HR ranging from 90's to low 100's.   Deficits limit the pt's ability to safely and independently perform ADL's, transfer, and ambulate. Pt will benefit from acute and post acute skilled PT services to address deficits for return to baseline function. Pt will benefit from [facility] for safety and energy conservation with ADL's at DC. Pt and daughter agreeable for recommendations.         If plan is discharge home, recommend the following: A little help with bathing/dressing/bathroom;Assistance with cooking/housework;Direct supervision/assist for medications  management;Direct supervision/assist for financial management;Assist for transportation;Supervision due to cognitive status     Equipment Recommendations BSC/3in1     Functional Status Assessment Patient has had a recent decline in their functional status and demonstrates the ability to make significant improvements in function in a reasonable and predictable amount of time.     Precautions / Restrictions Precautions Precautions: Fall Restrictions Weight Bearing Restrictions: No Other Position/Activity Restrictions: Aspiration      Mobility  Bed Mobility               General bed mobility comments: supervision for safety to sit EOB, HOB elevated to 90deg, use of BUE for support. Increased time/effort.    Transfers Overall transfer level: Needs assistance   Transfers: Sit to/from Stand Sit to Stand: Contact guard assist           General transfer comment: CGA for safety to stand from EOB with RW, demonstrating good hand placement without cueing. CGA for safety to sit EOB with RW, cues for hand placement, RW proximity, and sequencing.    Ambulation/Gait Ambulation/Gait assistance: Contact guard assist, Supervision Gait Distance (Feet): 40 Feet Assistive device: Rolling walker (2 wheels)         General Gait Details: Intermittent assist ranging from SBA-CGA for safety to ambulate with RW. Demonstrates slowed cadence, decreased step length/foot clearance bilaterally, narrow BOS, and decreased RW proximity. Multimodal cues for RW proximity for energy conservation, safety, and breathing.      Balance Overall balance assessment: Needs assistance Sitting-balance support: No upper extremity supported, Feet supported Sitting balance-Leahy Scale: Normal     Standing balance support: Bilateral upper extremity supported, During functional activity Standing balance-Leahy Scale: Good Standing balance comment: BUE support on RW  Pertinent Vitals/Pain Pain Assessment Pain Assessment: No/denies pain    Home Living Family/patient expects to be discharged to:: Private residence Living Arrangements: Alone Available Help at Discharge: Family;Available PRN/intermittently (Daughter comes by Tues/Thurs/Fri and son comes by on Sundays. HH aide comes by Tues/Fri to assist with bathing and other ADL's. Pt goes to [facility] 3x/wk.) Type of Home: Apartment Home Access: Level entry       Home Layout: One level Home Equipment: Agricultural consultant (2 wheels);Rollator (4 wheels);Cane - quad;Cane - single point;BSC/3in1;Grab bars - tub/shower;Grab bars - toilet;Tub bench Additional Comments: pt uses BSC over toilet    Prior Function               Mobility Comments: Pt uses RW for limited household ambulation. No hx of falls in the past 6 months. Daughter states pt is normally sedentary during the day, watching TV. Sleeps in regular bed with handrail; uses handrail to assist with bed mobility. ADLs Comments: Per daughter, pt able to be mod I for toileting and dressing. HH aide assists with bathing. Daughter and son assist wtih transportation, medication, and IADL's.     Extremity/Trunk Assessment   Upper Extremity Assessment Upper Extremity Assessment: Overall WFL for tasks assessed    Lower Extremity Assessment Lower Extremity Assessment: Overall WFL for tasks assessed       Communication   Communication Communication: No apparent difficulties Cueing Techniques: Verbal cues;Tactile cues;Visual cues  Cognition Arousal: Alert Behavior During Therapy: WFL for tasks assessed/performed Overall Cognitive Status: History of cognitive impairments - at baseline                                 General Comments: baseline dementia; A&O x3. Able to confirm situation and time with cues/choices.        General Comments General comments (skin integrity, edema, etc.): SpO2 >/= 91% throughout session on RA with HR  in 90's to low 100's. Labored breathing noted.    Exercises Other Exercises Other Exercises: Participates in bed mobility, transfers, and gait with RW. Other Exercises: Pt and daughter educated re: PT role/POC, DC recommendations, safety with functional mobility, DME recommendations, energy conservation/pacing. They verbalized understanding.   Assessment/Plan    PT Assessment Patient needs continued PT services  PT Problem List Decreased activity tolerance;Decreased balance       PT Treatment Interventions DME instruction;Gait training;Functional mobility training;Therapeutic activities;Therapeutic exercise;Balance training;Neuromuscular re-education    PT Goals (Current goals can be found in the Care Plan section)  Acute Rehab PT Goals Patient Stated Goal: "go home" PT Goal Formulation: With patient/family Time For Goal Achievement: 01/09/23 Potential to Achieve Goals: Good    Frequency Min 1X/week        AM-PAC PT "6 Clicks" Mobility  Outcome Measure Help needed turning from your back to your side while in a flat bed without using bedrails?: A Little Help needed moving from lying on your back to sitting on the side of a flat bed without using bedrails?: A Little Help needed moving to and from a bed to a chair (including a wheelchair)?: A Little Help needed standing up from a chair using your arms (e.g., wheelchair or bedside chair)?: A Little Help needed to walk in hospital room?: A Little Help needed climbing 3-5 steps with a railing? : A Little 6 Click Score: 18    End of Session Equipment Utilized During Treatment: Gait belt Activity Tolerance: Patient tolerated treatment well Patient  left: in bed;with call bell/phone within reach;with family/visitor present Nurse Communication: Mobility status PT Visit Diagnosis: Unsteadiness on feet (R26.81)    Time: 8295-6213 PT Time Calculation (min) (ACUTE ONLY): 23 min   Charges:   PT Evaluation $PT Eval Low Complexity: 1  Low PT Treatments $Therapeutic Activity: 8-22 mins PT General Charges $$ ACUTE PT VISIT: 1 Visit        Vira Blanco, PT, DPT 3:21 PM,12/26/22 Physical Therapist - [facility]

## 2022-12-26 NOTE — Progress Notes (Signed)
PROGRESS NOTE    Nancy Zhang  WUJ:[id number] DOB: 1925/10/13 DOA: 12/23/2022 PCP: [facility]  244A/244A-AA  LOS: 2 days   Brief hospital course:   Assessment & Plan: Nancy Zhang is a 89 y.o. female with medical history significant for  hypertension, diabetes mellitus type 2, CKD stage III a,presenting with chest pain, pt is oriented to self and place.  Presented today after being seen by home health nurse for chest pain.   Acute Pulmonary Embolism: --saddle PE with right heart strain. --started on heparin gtt --s/p pulmonary thrombectomy on 10/11 Plan: --ok to transition to Eliquis, per vascular surgery --cleared for discharge, per vascular surgery  Hyponatremia --improved  Hypokalemia --monitor and supplement PRN  >>HTN: --cont amlodipine   >>AKI, ruled out --does not meet criteria  CKD 3b  Bronchiolitis  --seen on CTA chest.  abx not indicated.  d/c'ed azithromycin.     >>DM II --A1c 7.1, well controlled --ACHS and SSI    DVT prophylaxis: [id number] Code Status: Full code  Family Communication: [facility] provider updated on the phone today Level of care: Progressive Dispo:   The patient is from: home Anticipated d/c is to: home Anticipated d/c date is: tomorrow   Subjective and Interval History:  Pt reported feeling well and asked to go home.    Per RN, pt walked 150 feet with just contact guard, without needing supplemental O2.  [facility] medical provider was concerned about pt going home, so PT evaluated pt and clear pt for discharge home with [facility].  However, due to concern from daughter and [facility] provider about pt being alone in her house (pt lives by herself PTA), and no family can stay with her at night, discharge was delayed until tomorrow, with everyone made aware of the risk of delirium for prolonged hospital stay.     Objective: Vitals:   12/26/22 0500 12/26/22 0522 12/26/22 0829 12/26/22 1225  BP:  133/73 (!) 132/41 (!) 125/43   Pulse:  74 79 75  Resp:  20 16 16   Temp:  98.2 F (36.8 C) 97.6 F (36.4 C) 97.9 F (36.6 C)  TempSrc:   Oral Oral  SpO2:  97% 97% 93%  Weight: 61.7 kg     Height:        Intake/Output Summary (Last 24 hours) at 12/26/2022 1643 Last data filed at 12/26/2022 0100 Gross per 24 hour  Intake 243 ml  Output 450 ml  Net -207 ml   Filed Weights   12/23/22 1642 12/24/22 1417 12/26/22 0500  Weight: 61 kg 60.1 kg 61.7 kg    Examination:   Constitutional: NAD, AAOx3 HEENT: conjunctivae and lids normal, EOMI CV: No cyanosis.   RESP: normal respiratory effort Neuro: II - XII grossly intact.   Psych: Normal mood and affect.  Appropriate judgement and reason   Data Reviewed: I have personally reviewed labs and imaging studies  35 min.  Darlin Priestly, MD [facility] If 7PM-7AM, please contact night-coverage 12/26/2022, 4:43 PM

## 2022-12-26 NOTE — Progress Notes (Signed)
[facility]  - [place] [facility]   PATIENT NAME: Nancy Zhang    MR#:  [id number]  DATE OF BIRTH:  02/25/1926  SUBJECTIVE:  No family at bedside during my evaluation. Overall breathing well. No cp. No issues per RN NPO for Mech thombectomy    VITALS:  Blood pressure (!) 132/41, pulse 79, temperature 97.6 F (36.4 C), temperature source Oral, resp. rate 16, height 5\' 3"  (1.6 m), weight 61.7 kg, SpO2 97%.  PHYSICAL EXAMINATION:   GENERAL:  89 y.o.-year-old patient with no acute distress.  LUNGS: decreased  breath sounds bilaterally, no wheezing CARDIOVASCULAR: S1, S2 normal. No murmur   ABDOMEN: Soft, nontender, nondistended.  EXTREMITIES: No  edema b/l.    NEUROLOGIC: nonfocal  patient is alert and awake  LABORATORY PANEL:  CBC Recent Labs  Lab 12/26/22 0334  WBC 7.7  HGB 11.3*  HCT 34.1*  PLT 218    Chemistries  Recent Labs  Lab 12/24/22 0413 12/25/22 0202 12/26/22 0334  NA 132*   < > 138  K 3.3*   < > 3.8  CL 96*   < > 98  CO2 26   < > 29  GLUCOSE 319*   < > 202*  BUN 26*   < > 33*  CREATININE 1.27*   < > 1.28*  CALCIUM 8.6*   < > 8.3*  MG  --    < > 1.9  AST 23  --   --   ALT 13  --   --   ALKPHOS 63  --   --   BILITOT 0.7  --   --    < > = values in this interval not displayed.   Cardiac Enzymes No results for input(s): "TROPONINI" in the last 168 hours. RADIOLOGY:  ECHOCARDIOGRAM COMPLETE BUBBLE STUDY  Result Date: 12/25/2022    ECHOCARDIOGRAM REPORT   Patient Name:   Nancy Zhang Date of Exam: 12/25/2022 Medical Rec #:  [id number]        Height:       63.0 in Accession #:    [id number]       Weight:       132.4 lb Date of Birth:  09-24-1925         BSA:          1.623 m Patient Age:    89 years         BP:           128/63 mmHg Patient Gender: F                HR:           78 bpm. Exam Location:  [facility] Procedure: 2D Echo, Cardiac Doppler, Color Doppler and Saline Contrast Bubble            Study Indications:     Pulmonary  embolus  History:         Patient has no prior history of Echocardiogram examinations.                  Risk Factors:Hypertension, Diabetes and Dyslipidemia. Pulmonary                  embolus, dementia, Breast CA.  Sonographer:     Mikki Harbor Referring Phys:  [id number] Eliezer Mccoy Elbie Statzer Diagnosing Phys: Debbe Odea MD  Sonographer Comments: Image acquisition challenging due to patient behavioral factors. IMPRESSIONS  1. Left ventricular ejection fraction, by estimation, is 65  to 70%. The left ventricle has normal function. The left ventricle has no regional wall motion abnormalities. There is mild left ventricular hypertrophy. Left ventricular diastolic parameters were normal.  2. Right ventricular systolic function is normal. The right ventricular size is normal. There is moderately elevated pulmonary artery systolic pressure.  3. Left atrial size was mild to moderately dilated.  4. The mitral valve is normal in structure. Trivial mitral valve regurgitation.  5. The aortic valve is tricuspid. Aortic valve regurgitation is not visualized. Aortic valve sclerosis is present, with no evidence of aortic valve stenosis.  6. Aortic dilatation noted. There is mild dilatation of the aortic root, measuring 39 mm.  7. The inferior vena cava is normal in size with greater than 50% respiratory variability, suggesting right atrial pressure of 3 mmHg. FINDINGS  Left Ventricle: Left ventricular ejection fraction, by estimation, is 65 to 70%. The left ventricle has normal function. The left ventricle has no regional wall motion abnormalities. The left ventricular internal cavity size was normal in size. There is  mild left ventricular hypertrophy. Left ventricular diastolic parameters were normal. Right Ventricle: The right ventricular size is normal. No increase in right ventricular wall thickness. Right ventricular systolic function is normal. There is moderately elevated pulmonary artery systolic pressure. The tricuspid  regurgitant velocity is 3.35 m/s, and with an assumed right atrial pressure of 3 mmHg, the estimated right ventricular systolic pressure is 47.9 mmHg. Left Atrium: Left atrial size was mild to moderately dilated. Right Atrium: Right atrial size was normal in size. Pericardium: There is no evidence of pericardial effusion. Mitral Valve: The mitral valve is normal in structure. Trivial mitral valve regurgitation. MV peak gradient, 3.4 mmHg. The mean mitral valve gradient is 1.0 mmHg. Tricuspid Valve: The tricuspid valve is normal in structure. Tricuspid valve regurgitation is mild. Aortic Valve: The aortic valve is tricuspid. Aortic valve regurgitation is not visualized. Aortic valve sclerosis is present, with no evidence of aortic valve stenosis. Aortic valve mean gradient measures 4.0 mmHg. Aortic valve peak gradient measures 6.8  mmHg. Aortic valve area, by VTI measures 2.31 cm. Pulmonic Valve: The pulmonic valve was not well visualized. Pulmonic valve regurgitation is not visualized. Aorta: Aortic dilatation noted. There is mild dilatation of the aortic root, measuring 39 mm. Venous: The inferior vena cava is normal in size with greater than 50% respiratory variability, suggesting right atrial pressure of 3 mmHg. IAS/Shunts: No atrial level shunt detected by color flow Doppler.  LEFT VENTRICLE PLAX 2D LVIDd:         4.30 cm   Diastology LVIDs:         2.30 cm   LV e' medial:    10.40 cm/s LV PW:         1.00 cm   LV E/e' medial:  8.3 LV IVS:        1.20 cm   LV e' lateral:   7.40 cm/s LVOT diam:     2.00 cm   LV E/e' lateral: 11.6 LV SV:         73 LV SV Index:   45 LVOT Area:     3.14 cm  RIGHT VENTRICLE RV Basal diam:  3.15 cm RV Mid diam:    2.80 cm RV S prime:     17.10 cm/s TAPSE (M-mode): 2.7 cm LEFT ATRIUM             Index        RIGHT ATRIUM  Index LA diam:        3.80 cm 2.34 cm/m   RA Area:     18.30 cm LA Vol (A2C):   42.7 ml 26.31 ml/m  RA Volume:   49.50 ml  30.50 ml/m LA Vol (A4C):    73.4 ml 45.23 ml/m LA Biplane Vol: 59.1 ml 36.42 ml/m  AORTIC VALVE                    PULMONIC VALVE AV Area (Vmax):    2.23 cm     PV Vmax:       1.22 m/s AV Area (Vmean):   1.99 cm     PV Peak grad:  6.0 mmHg AV Area (VTI):     2.31 cm AV Vmax:           130.50 cm/s AV Vmean:          93.800 cm/s AV VTI:            0.314 m AV Peak Grad:      6.8 mmHg AV Mean Grad:      4.0 mmHg LVOT Vmax:         92.60 cm/s LVOT Vmean:        59.300 cm/s LVOT VTI:          0.231 m LVOT/AV VTI ratio: 0.74  AORTA Ao Root diam: 3.90 cm MITRAL VALVE               TRICUSPID VALVE MV Area (PHT): 4.49 cm    TR Peak grad:   44.9 mmHg MV Area VTI:   3.63 cm    TR Vmax:        335.00 cm/s MV Peak grad:  3.4 mmHg MV Mean grad:  1.0 mmHg    SHUNTS MV Vmax:       0.92 m/s    Systemic VTI:  0.23 m MV Vmean:      54.3 cm/s   Systemic Diam: 2.00 cm MV Decel Time: 169 msec MV E velocity: 85.90 cm/s MV A velocity: 58.70 cm/s MV E/A ratio:  1.46 Debbe Odea MD Electronically signed by Debbe Odea MD Signature Date/Time: 12/25/2022/1:35:00 PM    Final (Updated)    PERIPHERAL VASCULAR CATHETERIZATION  Result Date: 12/25/2022 See surgical note for result.  US Venous Img Lower Bilateral (DVT)  Result Date: 12/24/2022 CLINICAL DATA:  Pulmonary embolism. Evaluate for lower extremity DVT. History of breast cancer. EXAM: BILATERAL LOWER EXTREMITY VENOUS DOPPLER ULTRASOUND TECHNIQUE: Gray-scale sonography with graded compression, as well as color Doppler and duplex ultrasound were performed to evaluate the lower extremity deep venous systems from the level of the common femoral vein and including the common femoral, femoral, profunda femoral, popliteal and calf veins including the posterior tibial, peroneal and gastrocnemius veins when visible. The superficial great saphenous vein was also interrogated. Spectral Doppler was utilized to evaluate flow at rest and with distal augmentation maneuvers in the common femoral, femoral  and popliteal veins. COMPARISON:  None Available. FINDINGS: RIGHT LOWER EXTREMITY Common Femoral Vein: No evidence of thrombus. Normal compressibility, respiratory phasicity and response to augmentation. Saphenofemoral Junction: No evidence of thrombus. Normal compressibility and flow on color Doppler imaging. Profunda Femoral Vein: No evidence of thrombus. Normal compressibility and flow on color Doppler imaging. Femoral Vein: No evidence of thrombus. Normal compressibility, respiratory phasicity and response to augmentation. Popliteal Vein: No evidence of thrombus. Normal compressibility, respiratory phasicity and response to augmentation. Calf Veins: Appear patent where imaged.  Superficial Great Saphenous Vein: No evidence of thrombus. Normal compressibility. Other Findings:  None. LEFT LOWER EXTREMITY Common Femoral Vein: No evidence of thrombus. Normal compressibility, respiratory phasicity and response to augmentation. Saphenofemoral Junction: No evidence of thrombus. Normal compressibility and flow on color Doppler imaging. Profunda Femoral Vein: No evidence of thrombus. Normal compressibility and flow on color Doppler imaging. Femoral Vein: No evidence of thrombus. Normal compressibility, respiratory phasicity and response to augmentation. Popliteal Vein: No evidence of thrombus. Normal compressibility, respiratory phasicity and response to augmentation. Calf Veins: No evidence of thrombus. Normal compressibility and flow on color Doppler imaging. Superficial Great Saphenous Vein: No evidence of thrombus. Normal compressibility. Other Findings:  None. IMPRESSION: No evidence of DVT within either lower extremity. Electronically Signed   By: Simonne Come M.D.   On: 12/24/2022 12:28    Assessment and Plan LAKELEE DEVERAUX is a 89 y.o. female with medical history significant for  hypertension, diabetes mellitus type 2, CKD stage III a,presenting with chest pain, pt is oriented to self and place.  Presented  today after being seen by home health nurse for chest pain.   Acute Pulmonary Embolism: --saddle PE with right heart strain. --pulmonary thrombectomy today by Dr Wyn Quaker --cont heparin gtt   Hyponatremia --improved   Hypokalemia --monitor and supplement PRN   HTN: --cont amlodipine   AKI, ruled out --does not meet criteria   CKD 3b   Bronchiolitis  --seen on CTA chest.  abx not indicated.  d/c azithromycin.     DM II, with mild hyperglycemia --ACHS and SSI     DVT prophylaxis: [id number] gtt Code Status: Full code  Family Communication: none today COnsult:vascular Level of care: Progressive Status is: Inpatient Remains inpatient appropriate because: Large PE    TOTAL TIME TAKING CARE OF THIS PATIENT: 35 minutes.  >50% time spent on counselling and coordination of care  Note: This dictation was prepared with Dragon dictation along with smaller phrase technology. Any transcriptional errors that result from this process are unintentional.  Enedina Finner M.D    [facility]   CC: Primary care physician; [facility] [employer]

## 2022-12-26 NOTE — Plan of Care (Signed)

## 2022-12-26 NOTE — TOC Progression Note (Signed)
Transition of Care [facility]) - Progression Note    Patient Details  Name: Nancy Zhang MRN: [id number] Date of Birth: Dec 23, 1925  Transition of [facility] CM/SW Contact  Bing Quarry, RN Phone Number: 12/26/2022, 2:07 PM  Clinical Narrative: 10/12: Per request from prior CM note RN CM contacted [facility] program via Intake line to notify of pending discharge order.  The Medical Director of PACE returned call to RN CM and is familiar with patient and expressed concerns that discharge might be premature given age of patient and procedure just done on 12/25/22.   Patient lives alone at [place] and had Home Care aide at home via [facility]. This would have to be rescheduled with staffing and new medications picked up. Medical director asked this RN CM to relay concerns to provider and also inquired about a PT evaluation for post acute care needs along with other concerns for a safe discharge plan.   Unit RN spoke to RN CM and indicated she had spoken to family and that there would not be assistance available but for a few hours till Monday or unable to resume till Monday. Updated provider re concerns, PT evaluation ordered. [facility] medical director also requested an update after PT evaluation.   Gabriel Cirri MSN RN CM  Transitions of Care Department [facility] [phone] Weekends Only       Barriers to Discharge: Barriers Unresolved (comment) (Medical Director of [facility]ACE program feels discharge may be premature given age/procedure as well as some logistics that need to be in place ahead of discharge. Family indicated to unit RN that other assistance would not be avail. till Mon. pt lives alone.)  Expected Discharge Plan and Services         Expected Discharge Date: 12/26/22               DME Arranged: N/A DME Agency: NA       HH Arranged: NA HH Agency: NA         Social Determinants of Health (SDOH) Interventions SDOH Screenings   Tobacco Use: Low Risk  (12/23/2022)     Readmission Risk Interventions     No data to display

## 2022-12-26 NOTE — TOC Transition Note (Signed)
Transition of Care [facility]) - CM/SW Discharge Note   Patient Details  Name: Nancy Zhang MRN: [id number] Date of Birth: [birth year]  Transition of Care [facility]) CM/SW Contact:  Bing Quarry, RN Phone Number: 12/26/2022, 12:58 PM   Clinical Narrative: 10/12:  Pt to ER 12/23/22 from home after being seen by [facility] nurse where patient was c/o mid sternal; sharp chest pain. Patient is now s/p right endovascular pulmonary thrombectomy for symptomatic pulmonary embolism. Discharging to home/self care. Was previously receiving HH PTA. [facility] insurance. PCP: [facility]. No SDOH needs flagged. Will attempt to notify [facility]/ RN Darlene of discharge.   Gabriel Cirri MSN RN CM  Transitions of Care Department [facility] [phone] Weekends Only      Final next level of care: Home/Self Care Barriers to Discharge: No Barriers Identified   Patient Goals and CMS Choice      Discharge Placement                         Discharge Plan and Services Additional resources added to the After Visit Summary for                  DME Arranged: N/A DME Agency: NA       HH Arranged: NA HH Agency: NA        Social Determinants of Health (SDOH) Interventions SDOH Screenings   Tobacco Use: Low Risk  (12/23/2022)     Readmission Risk Interventions     No data to display

## 2022-12-27 DIAGNOSIS — I2602 Saddle embolus of pulmonary artery with acute cor pulmonale: Secondary | ICD-10-CM | POA: Diagnosis not present

## 2022-12-27 LAB — GLUCOSE, CAPILLARY
Glucose-Capillary: 101 mg/dL — ABNORMAL HIGH (ref 70–99)
Glucose-Capillary: 272 mg/dL — ABNORMAL HIGH (ref 70–99)

## 2022-12-27 LAB — CBC
HCT: 33.4 % — ABNORMAL LOW (ref 36.0–46.0)
Hemoglobin: 11.3 g/dL — ABNORMAL LOW (ref 12.0–15.0)
MCH: 27.9 pg (ref 26.0–34.0)
MCHC: 33.8 g/dL (ref 30.0–36.0)
MCV: 82.5 fL (ref 80.0–100.0)
Platelets: 237 10*3/uL (ref 150–400)
RBC: 4.05 MIL/uL (ref 3.87–5.11)
RDW: 13.4 % (ref 11.5–15.5)
WBC: 9.8 10*3/uL (ref 4.0–10.5)
nRBC: 0 % (ref 0.0–0.2)

## 2022-12-27 LAB — MAGNESIUM: Magnesium: 1.8 mg/dL (ref 1.7–2.4)

## 2022-12-27 LAB — BASIC METABOLIC PANEL
Anion gap: 13 (ref 5–15)
BUN: 40 mg/dL — ABNORMAL HIGH (ref 8–23)
CO2: 27 mmol/L (ref 22–32)
Calcium: 8.5 mg/dL — ABNORMAL LOW (ref 8.9–10.3)
Chloride: 98 mmol/L (ref 98–111)
Creatinine, Ser: 1.51 mg/dL — ABNORMAL HIGH (ref 0.44–1.00)
GFR, Estimated: 32 mL/min — ABNORMAL LOW (ref >60)
Glucose, Bld: 141 mg/dL — ABNORMAL HIGH (ref 70–99)
Potassium: 3.5 mmol/L (ref 3.5–5.1)
Sodium: 138 mmol/L (ref 135–145)

## 2022-12-27 NOTE — Plan of Care (Signed)

## 2022-12-27 NOTE — Discharge Summary (Signed)
Durable Medical Equipment  (From admission, onward)           Start     Ordered   12/26/22 1441  For home use only DME Bedside commode  Once       Question:  Patient needs a bedside commode to treat with the following condition  Answer:  Weakness   12/26/22 1440             Follow-up Information     [facility] Follow up in 1 week(s).   Contact information: [address] [place] [address] [phone]                 Allergies  Allergen Reactions   Fish Allergy    Shellfish Allergy Swelling     The results of significant diagnostics from  this hospitalization (including imaging, microbiology, ancillary and laboratory) are listed below for reference.   Consultations:   Procedures/Studies: ECHOCARDIOGRAM COMPLETE BUBBLE STUDY  Result Date: 12/25/2022    ECHOCARDIOGRAM REPORT   Patient Name:   Nancy Zhang Date of Exam: 12/25/2022 Medical Rec #:  [id number]        Height:       63.0 in Accession #:    [id number]       Weight:       132.4 lb Date of Birth:  04-Jan-1926         BSA:          1.623 m Patient Age:    89 years         BP:           128/63 mmHg Patient Gender: F                HR:           78 bpm. Exam Location:  [facility] Procedure: 2D Echo, Cardiac Doppler, Color Doppler and Saline Contrast Bubble            Study Indications:     Pulmonary embolus  History:         Patient has no prior history of Echocardiogram examinations.                  Risk Factors:Hypertension, Diabetes and Dyslipidemia. Pulmonary                  embolus, dementia, Breast CA.  Sonographer:     Mikki Harbor Referring Phys:  [id number] Eliezer Mccoy PATEL Diagnosing Phys: Debbe Odea MD  Sonographer Comments: Image acquisition challenging due to patient behavioral factors. IMPRESSIONS  1. Left ventricular ejection fraction, by estimation, is 65 to 70%. The left ventricle has normal function. The left ventricle has no regional wall motion abnormalities. There is mild left ventricular hypertrophy. Left ventricular diastolic parameters were normal.  2. Right ventricular systolic function is normal. The right ventricular size is normal. There is moderately elevated pulmonary artery systolic pressure.  3. Left atrial size was mild to moderately dilated.  4. The mitral valve is normal in structure. Trivial mitral valve regurgitation.  5. The aortic valve is tricuspid. Aortic valve regurgitation is not visualized. Aortic valve sclerosis is present, with no evidence of aortic valve stenosis.  6. Aortic dilatation noted. There is mild dilatation of the aortic root,  measuring 39 mm.  7. The inferior vena cava is normal in size with greater than 50% respiratory variability, suggesting right atrial pressure of 3 mmHg. FINDINGS  Left Ventricle: Left ventricular ejection fraction,  Physician Discharge Summary   Nancy Zhang  female DOB: 07-04-1925  ZOX:[id number]  PCP: [facility]  Admit date: 12/23/2022 Discharge date: 12/27/2022  Admitted From: home Disposition:  home Home Health: provided by [facility] CODE STATUS: Full code   Hospital Course:  For full details, please see H&P, progress notes, consult notes and ancillary notes.  Briefly,  Nancy Zhang is a 89 y.o. female with medical history significant for hypertension, diabetes mellitus type 2, CKD stage IIIa, presenting with chest pain.   Acute Pulmonary Embolism: --saddle PE with right heart strain. --started on heparin gtt --s/p pulmonary thrombectomy on 10/11 --Pt was transitioned to Eliquis on 10/12.  Discussed with [facility] medical provider who will Rx and provide Eliquis to pt. --cleared for discharge, per vascular surgery --cleared for discharge home by PT.   Hyponatremia --mild.  Resolved prior to discharge.   Hypokalemia --monitored and supplemented PRN   >>HTN: --cont amlodipine   >>AKI, ruled out --does not meet criteria   CKD 3b   Bronchiolitis  --seen on CTA chest.  abx not indicated.  d/c'ed azithromycin.     >>DM II --A1c 7.1, well controlled --not on hypoglycemics at home.   Unless noted above, medications under "STOP" list are ones pt was not taking PTA.  Discharge Diagnoses:  Principal Problem:   Chest pain Active Problems:   Acute pulmonary embolism (HCC)   Diabetes (HCC)   HTN (hypertension)   Status post closed fracture of left femur   Chronic dementia (HCC)   30 Day Unplanned Readmission Risk Score    Flowsheet Row ED to Hosp-Admission (Current) from 12/23/2022 in [facility]U  30 Day Unplanned Readmission Risk Score (%) 18.29 Filed at 12/27/2022 0801       This score is the patient's risk of an unplanned readmission within 30 days of being discharged (0 -100%). The score is based on dignosis, age, lab data,  medications, orders, and past utilization.   Low:  0-14.9   Medium: 15-21.9   High: 22-29.9   Extreme: 30 and above         Discharge Instructions:  Allergies as of 12/27/2022       Reactions   Fish Allergy    Shellfish Allergy Swelling        Medication List     STOP taking these medications    acetaminophen 325 MG tablet Commonly known as: TYLENOL       TAKE these medications    albuterol 108 (90 Base) MCG/ACT inhaler Commonly known as: VENTOLIN HFA Inhale 1-2 puffs into the lungs every 4 (four) hours as needed for wheezing or shortness of breath.   amLODipine 5 MG tablet Commonly known as: NORVASC Take 5 mg by mouth daily.   apixaban 5 MG Tabs tablet Commonly known as: Eliquis Take 10 mg (2 tabs) twice daily for 7 days, then 5 mg (1 tab) twice daily for the next 6 months.   Calcium Carbonate-Vitamin D3 600-400 MG-UNIT Tabs Take 1 tablet by mouth daily.   cholecalciferol 25 MCG (1000 UNIT) tablet Commonly known as: VITAMIN D3 Take 1,000 Units by mouth daily.   fexofenadine 180 MG tablet Commonly known as: ALLEGRA Take 180 mg by mouth daily.   hydrochlorothiazide 12.5 MG tablet Commonly known as: HYDRODIURIL Take 12.5 mg by mouth daily.   metoprolol succinate 25 MG 24 hr tablet Commonly known as: TOPROL-XL Take 25 mg by mouth daily.  Physician Discharge Summary   Nancy Zhang  female DOB: 07-04-1925  ZOX:[id number]  PCP: [facility]  Admit date: 12/23/2022 Discharge date: 12/27/2022  Admitted From: home Disposition:  home Home Health: provided by [facility] CODE STATUS: Full code   Hospital Course:  For full details, please see H&P, progress notes, consult notes and ancillary notes.  Briefly,  Nancy Zhang is a 89 y.o. female with medical history significant for hypertension, diabetes mellitus type 2, CKD stage IIIa, presenting with chest pain.   Acute Pulmonary Embolism: --saddle PE with right heart strain. --started on heparin gtt --s/p pulmonary thrombectomy on 10/11 --Pt was transitioned to Eliquis on 10/12.  Discussed with [facility] medical provider who will Rx and provide Eliquis to pt. --cleared for discharge, per vascular surgery --cleared for discharge home by PT.   Hyponatremia --mild.  Resolved prior to discharge.   Hypokalemia --monitored and supplemented PRN   >>HTN: --cont amlodipine   >>AKI, ruled out --does not meet criteria   CKD 3b   Bronchiolitis  --seen on CTA chest.  abx not indicated.  d/c'ed azithromycin.     >>DM II --A1c 7.1, well controlled --not on hypoglycemics at home.   Unless noted above, medications under "STOP" list are ones pt was not taking PTA.  Discharge Diagnoses:  Principal Problem:   Chest pain Active Problems:   Acute pulmonary embolism (HCC)   Diabetes (HCC)   HTN (hypertension)   Status post closed fracture of left femur   Chronic dementia (HCC)   30 Day Unplanned Readmission Risk Score    Flowsheet Row ED to Hosp-Admission (Current) from 12/23/2022 in [facility]  30 Day Unplanned Readmission Risk Score (%) 18.29 Filed at 12/27/2022 0801       This score is the patient's risk of an unplanned readmission within 30 days of being discharged (0 -100%). The score is based on dignosis, age, lab data,  medications, orders, and past utilization.   Low:  0-14.9   Medium: 15-21.9   High: 22-29.9   Extreme: 30 and above         Discharge Instructions:  Allergies as of 12/27/2022       Reactions   Fish Allergy    Shellfish Allergy Swelling        Medication List     STOP taking these medications    acetaminophen 325 MG tablet Commonly known as: TYLENOL       TAKE these medications    albuterol 108 (90 Base) MCG/ACT inhaler Commonly known as: VENTOLIN HFA Inhale 1-2 puffs into the lungs every 4 (four) hours as needed for wheezing or shortness of breath.   amLODipine 5 MG tablet Commonly known as: NORVASC Take 5 mg by mouth daily.   apixaban 5 MG Tabs tablet Commonly known as: Eliquis Take 10 mg (2 tabs) twice daily for 7 days, then 5 mg (1 tab) twice daily for the next 6 months.   Calcium Carbonate-Vitamin D3 600-400 MG-UNIT Tabs Take 1 tablet by mouth daily.   cholecalciferol 25 MCG (1000 UNIT) tablet Commonly known as: VITAMIN D3 Take 1,000 Units by mouth daily.   fexofenadine 180 MG tablet Commonly known as: ALLEGRA Take 180 mg by mouth daily.   hydrochlorothiazide 12.5 MG tablet Commonly known as: HYDRODIURIL Take 12.5 mg by mouth daily.   metoprolol succinate 25 MG 24 hr tablet Commonly known as: TOPROL-XL Take 25 mg by mouth daily.  Physician Discharge Summary   Nancy Zhang  female DOB: 07-04-1925  ZOX:[id number]  PCP: [facility]  Admit date: 12/23/2022 Discharge date: 12/27/2022  Admitted From: home Disposition:  home Home Health: provided by [facility] CODE STATUS: Full code   Hospital Course:  For full details, please see H&P, progress notes, consult notes and ancillary notes.  Briefly,  Nancy Zhang is a 89 y.o. female with medical history significant for hypertension, diabetes mellitus type 2, CKD stage IIIa, presenting with chest pain.   Acute Pulmonary Embolism: --saddle PE with right heart strain. --started on heparin gtt --s/p pulmonary thrombectomy on 10/11 --Pt was transitioned to Eliquis on 10/12.  Discussed with [facility] medical provider who will Rx and provide Eliquis to pt. --cleared for discharge, per vascular surgery --cleared for discharge home by PT.   Hyponatremia --mild.  Resolved prior to discharge.   Hypokalemia --monitored and supplemented PRN   >>HTN: --cont amlodipine   >>AKI, ruled out --does not meet criteria   CKD 3b   Bronchiolitis  --seen on CTA chest.  abx not indicated.  d/c'ed azithromycin.     >>DM II --A1c 7.1, well controlled --not on hypoglycemics at home.   Unless noted above, medications under "STOP" list are ones pt was not taking PTA.  Discharge Diagnoses:  Principal Problem:   Chest pain Active Problems:   Acute pulmonary embolism (HCC)   Diabetes (HCC)   HTN (hypertension)   Status post closed fracture of left femur   Chronic dementia (HCC)   30 Day Unplanned Readmission Risk Score    Flowsheet Row ED to Hosp-Admission (Current) from 12/23/2022 in [facility]  30 Day Unplanned Readmission Risk Score (%) 18.29 Filed at 12/27/2022 0801       This score is the patient's risk of an unplanned readmission within 30 days of being discharged (0 -100%). The score is based on dignosis, age, lab data,  medications, orders, and past utilization.   Low:  0-14.9   Medium: 15-21.9   High: 22-29.9   Extreme: 30 and above         Discharge Instructions:  Allergies as of 12/27/2022       Reactions   Fish Allergy    Shellfish Allergy Swelling        Medication List     STOP taking these medications    acetaminophen 325 MG tablet Commonly known as: TYLENOL       TAKE these medications    albuterol 108 (90 Base) MCG/ACT inhaler Commonly known as: VENTOLIN HFA Inhale 1-2 puffs into the lungs every 4 (four) hours as needed for wheezing or shortness of breath.   amLODipine 5 MG tablet Commonly known as: NORVASC Take 5 mg by mouth daily.   apixaban 5 MG Tabs tablet Commonly known as: Eliquis Take 10 mg (2 tabs) twice daily for 7 days, then 5 mg (1 tab) twice daily for the next 6 months.   Calcium Carbonate-Vitamin D3 600-400 MG-UNIT Tabs Take 1 tablet by mouth daily.   cholecalciferol 25 MCG (1000 UNIT) tablet Commonly known as: VITAMIN D3 Take 1,000 Units by mouth daily.   fexofenadine 180 MG tablet Commonly known as: ALLEGRA Take 180 mg by mouth daily.   hydrochlorothiazide 12.5 MG tablet Commonly known as: HYDRODIURIL Take 12.5 mg by mouth daily.   metoprolol succinate 25 MG 24 hr tablet Commonly known as: TOPROL-XL Take 25 mg by mouth daily.  Durable Medical Equipment  (From admission, onward)           Start     Ordered   12/26/22 1441  For home use only DME Bedside commode  Once       Question:  Patient needs a bedside commode to treat with the following condition  Answer:  Weakness   12/26/22 1440             Follow-up Information     [facility] Follow up in 1 week(s).   Contact information: [address] [place] [address] [phone]                 Allergies  Allergen Reactions   Fish Allergy    Shellfish Allergy Swelling     The results of significant diagnostics from  this hospitalization (including imaging, microbiology, ancillary and laboratory) are listed below for reference.   Consultations:   Procedures/Studies: ECHOCARDIOGRAM COMPLETE BUBBLE STUDY  Result Date: 12/25/2022    ECHOCARDIOGRAM REPORT   Patient Name:   Nancy Zhang Date of Exam: 12/25/2022 Medical Rec #:  [id number]        Height:       63.0 in Accession #:    [id number]       Weight:       132.4 lb Date of Birth:  04-Jan-1926         BSA:          1.623 m Patient Age:    89 years         BP:           128/63 mmHg Patient Gender: F                HR:           78 bpm. Exam Location:  [facility] Procedure: 2D Echo, Cardiac Doppler, Color Doppler and Saline Contrast Bubble            Study Indications:     Pulmonary embolus  History:         Patient has no prior history of Echocardiogram examinations.                  Risk Factors:Hypertension, Diabetes and Dyslipidemia. Pulmonary                  embolus, dementia, Breast CA.  Sonographer:     Mikki Harbor Referring Phys:  [id number] Eliezer Mccoy PATEL Diagnosing Phys: Debbe Odea MD  Sonographer Comments: Image acquisition challenging due to patient behavioral factors. IMPRESSIONS  1. Left ventricular ejection fraction, by estimation, is 65 to 70%. The left ventricle has normal function. The left ventricle has no regional wall motion abnormalities. There is mild left ventricular hypertrophy. Left ventricular diastolic parameters were normal.  2. Right ventricular systolic function is normal. The right ventricular size is normal. There is moderately elevated pulmonary artery systolic pressure.  3. Left atrial size was mild to moderately dilated.  4. The mitral valve is normal in structure. Trivial mitral valve regurgitation.  5. The aortic valve is tricuspid. Aortic valve regurgitation is not visualized. Aortic valve sclerosis is present, with no evidence of aortic valve stenosis.  6. Aortic dilatation noted. There is mild dilatation of the aortic root,  measuring 39 mm.  7. The inferior vena cava is normal in size with greater than 50% respiratory variability, suggesting right atrial pressure of 3 mmHg. FINDINGS  Left Ventricle: Left ventricular ejection fraction,  Durable Medical Equipment  (From admission, onward)           Start     Ordered   12/26/22 1441  For home use only DME Bedside commode  Once       Question:  Patient needs a bedside commode to treat with the following condition  Answer:  Weakness   12/26/22 1440             Follow-up Information     [facility] Follow up in 1 week(s).   Contact information: [address] [place] [address] [phone]                 Allergies  Allergen Reactions   Fish Allergy    Shellfish Allergy Swelling     The results of significant diagnostics from  this hospitalization (including imaging, microbiology, ancillary and laboratory) are listed below for reference.   Consultations:   Procedures/Studies: ECHOCARDIOGRAM COMPLETE BUBBLE STUDY  Result Date: 12/25/2022    ECHOCARDIOGRAM REPORT   Patient Name:   Nancy Zhang Date of Exam: 12/25/2022 Medical Rec #:  [id number]        Height:       63.0 in Accession #:    [id number]       Weight:       132.4 lb Date of Birth:  04-Jan-1926         BSA:          1.623 m Patient Age:    89 years         BP:           128/63 mmHg Patient Gender: F                HR:           78 bpm. Exam Location:  [facility] Procedure: 2D Echo, Cardiac Doppler, Color Doppler and Saline Contrast Bubble            Study Indications:     Pulmonary embolus  History:         Patient has no prior history of Echocardiogram examinations.                  Risk Factors:Hypertension, Diabetes and Dyslipidemia. Pulmonary                  embolus, dementia, Breast CA.  Sonographer:     Mikki Harbor Referring Phys:  [id number] Eliezer Mccoy PATEL Diagnosing Phys: Debbe Odea MD  Sonographer Comments: Image acquisition challenging due to patient behavioral factors. IMPRESSIONS  1. Left ventricular ejection fraction, by estimation, is 65 to 70%. The left ventricle has normal function. The left ventricle has no regional wall motion abnormalities. There is mild left ventricular hypertrophy. Left ventricular diastolic parameters were normal.  2. Right ventricular systolic function is normal. The right ventricular size is normal. There is moderately elevated pulmonary artery systolic pressure.  3. Left atrial size was mild to moderately dilated.  4. The mitral valve is normal in structure. Trivial mitral valve regurgitation.  5. The aortic valve is tricuspid. Aortic valve regurgitation is not visualized. Aortic valve sclerosis is present, with no evidence of aortic valve stenosis.  6. Aortic dilatation noted. There is mild dilatation of the aortic root,  measuring 39 mm.  7. The inferior vena cava is normal in size with greater than 50% respiratory variability, suggesting right atrial pressure of 3 mmHg. FINDINGS  Left Ventricle: Left ventricular ejection fraction,  Physician Discharge Summary   Nancy Zhang  female DOB: 07-04-1925  ZOX:[id number]  PCP: [facility]  Admit date: 12/23/2022 Discharge date: 12/27/2022  Admitted From: home Disposition:  home Home Health: provided by [facility] CODE STATUS: Full code   Hospital Course:  For full details, please see H&P, progress notes, consult notes and ancillary notes.  Briefly,  Nancy Zhang is a 89 y.o. female with medical history significant for hypertension, diabetes mellitus type 2, CKD stage IIIa, presenting with chest pain.   Acute Pulmonary Embolism: --saddle PE with right heart strain. --started on heparin gtt --s/p pulmonary thrombectomy on 10/11 --Pt was transitioned to Eliquis on 10/12.  Discussed with [facility] medical provider who will Rx and provide Eliquis to pt. --cleared for discharge, per vascular surgery --cleared for discharge home by PT.   Hyponatremia --mild.  Resolved prior to discharge.   Hypokalemia --monitored and supplemented PRN   >>HTN: --cont amlodipine   >>AKI, ruled out --does not meet criteria   CKD 3b   Bronchiolitis  --seen on CTA chest.  abx not indicated.  d/c'ed azithromycin.     >>DM II --A1c 7.1, well controlled --not on hypoglycemics at home.   Unless noted above, medications under "STOP" list are ones pt was not taking PTA.  Discharge Diagnoses:  Principal Problem:   Chest pain Active Problems:   Acute pulmonary embolism (HCC)   Diabetes (HCC)   HTN (hypertension)   Status post closed fracture of left femur   Chronic dementia (HCC)   30 Day Unplanned Readmission Risk Score    Flowsheet Row ED to Hosp-Admission (Current) from 12/23/2022 in [facility]  30 Day Unplanned Readmission Risk Score (%) 18.29 Filed at 12/27/2022 0801       This score is the patient's risk of an unplanned readmission within 30 days of being discharged (0 -100%). The score is based on dignosis, age, lab data,  medications, orders, and past utilization.   Low:  0-14.9   Medium: 15-21.9   High: 22-29.9   Extreme: 30 and above         Discharge Instructions:  Allergies as of 12/27/2022       Reactions   Fish Allergy    Shellfish Allergy Swelling        Medication List     STOP taking these medications    acetaminophen 325 MG tablet Commonly known as: TYLENOL       TAKE these medications    albuterol 108 (90 Base) MCG/ACT inhaler Commonly known as: VENTOLIN HFA Inhale 1-2 puffs into the lungs every 4 (four) hours as needed for wheezing or shortness of breath.   amLODipine 5 MG tablet Commonly known as: NORVASC Take 5 mg by mouth daily.   apixaban 5 MG Tabs tablet Commonly known as: Eliquis Take 10 mg (2 tabs) twice daily for 7 days, then 5 mg (1 tab) twice daily for the next 6 months.   Calcium Carbonate-Vitamin D3 600-400 MG-UNIT Tabs Take 1 tablet by mouth daily.   cholecalciferol 25 MCG (1000 UNIT) tablet Commonly known as: VITAMIN D3 Take 1,000 Units by mouth daily.   fexofenadine 180 MG tablet Commonly known as: ALLEGRA Take 180 mg by mouth daily.   hydrochlorothiazide 12.5 MG tablet Commonly known as: HYDRODIURIL Take 12.5 mg by mouth daily.   metoprolol succinate 25 MG 24 hr tablet Commonly known as: TOPROL-XL Take 25 mg by mouth daily.

## 2022-12-27 NOTE — TOC Progression Note (Signed)
Transition of Care [facility]) - Progression Note    Patient Details  Name: Nancy Zhang MRN: [id number] Date of Birth: [birth year]  Transition of Care [facility]) CM/SW Contact  Bing Quarry, RN Phone Number: 12/27/2022, 9:54 AM  Clinical Narrative:  10/13: Patient to discharge to home today with [facility] and DME needs arranged via [facility]/Dr. Moux. [facility] will have a RN to patient's home today and will pick up Eliquis and deliver to patient per Dr. Sheral Flow. Daughter to transport to home. Notified [facility]/Dr. Moux on 10/12 and again this am of discharge today and will visit patient in home today.   Gabriel Cirri MSN RN CM  Transitions of Care Department [facility] [phone] Weekends Only        Barriers to Discharge: Barriers Resolved  Expected Discharge Plan and Services         Expected Discharge Date: 12/27/22               DME Arranged: 3-N-1 ([facility] to provider per Dr. Sheral Flow) DME Agency: Other - Comment ([facility]) Date DME Agency Contacted: 12/26/22 Time DME Agency Contacted: 1350 Representative spoke with at DME Agency: Dr. Sheral Flow HH Arranged: PT HH Agency: Other - See comment ([facility]) Date HH Agency Contacted: 12/26/22 Time HH Agency Contacted: 1350 Representative spoke with at [facility] Agency: Dr. Sheral Flow   Social Determinants of Health (SDOH) Interventions SDOH Screenings   Tobacco Use: Low Risk  (12/23/2022)    Readmission Risk Interventions     No data to display

## 2022-12-28 ENCOUNTER — Telehealth (INDEPENDENT_AMBULATORY_CARE_PROVIDER_SITE_OTHER): Payer: Self-pay

## 2022-12-28 ENCOUNTER — Encounter: Payer: Self-pay | Admitting: Vascular Surgery

## 2022-12-28 NOTE — Telephone Encounter (Signed)
Message given

## 2022-12-28 NOTE — Telephone Encounter (Addendum)
Dr. Octavio Graves called about Nancy Zhang having a pulmonary thrombectomy last week with Dr. Wyn Quaker. She stated the patient is doing well, however, she would like to change her medication from Eliquis to Xarelto 2 times daily. As of now she is taking medication once a day and she wants to get her in the habit of taking meds 2 times daily. Patient lives alone, but could be helped with this change by her family, the pharmacy and her PT. She feels the Xarelto will work after having such a big clot, but doesn;t want to mess with Dr. Kristeen Mans anticoagulation recommendations without checking in first.   Please advise.

## 2023-01-22 ENCOUNTER — Encounter (INDEPENDENT_AMBULATORY_CARE_PROVIDER_SITE_OTHER): Payer: Self-pay | Admitting: Vascular Surgery

## 2023-01-22 ENCOUNTER — Ambulatory Visit (INDEPENDENT_AMBULATORY_CARE_PROVIDER_SITE_OTHER): Payer: Medicare (Managed Care) | Admitting: Vascular Surgery

## 2023-01-22 VITALS — BP 117/73 | HR 66 | Resp 15

## 2023-01-22 DIAGNOSIS — I2602 Saddle embolus of pulmonary artery with acute cor pulmonale: Secondary | ICD-10-CM | POA: Diagnosis not present

## 2023-01-22 DIAGNOSIS — I10 Essential (primary) hypertension: Secondary | ICD-10-CM | POA: Diagnosis not present

## 2023-01-22 NOTE — Progress Notes (Signed)
MRN : [id number]  Nancy Zhang is a 89 y.o. (23-Jan-1926) female who presents with chief complaint of  Chief Complaint  Patient presents with   Follow-up    [facility] 1 month follow up  .  History of Present Illness: Patient returns today in follow up of her pulmonary embolus.  She is about a month status post pulmonary thrombectomy for submassive pulmonary embolus with right heart strain.  She looks very well today.  She is not complaining of any shortness of breath or chest pain currently.  She is tolerating anticoagulation.  She does still have some right leg swelling.  She had no access related complications and the site is well-healed.  Current Outpatient Medications  Medication Sig Dispense Refill   albuterol (VENTOLIN HFA) 108 (90 Base) MCG/ACT inhaler Inhale 1-2 puffs into the lungs every 4 (four) hours as needed for wheezing or shortness of breath.     amLODipine (NORVASC) 5 MG tablet Take 5 mg by mouth daily.     apixaban (ELIQUIS) 5 MG TABS tablet Take 10 mg (2 tabs) twice daily for 7 days, then 5 mg (1 tab) twice daily for the next 6 months. 60 tablet 6   Calcium Carbonate-Vitamin D3 600-400 MG-UNIT TABS Take 1 tablet by mouth daily.     cholecalciferol (VITAMIN D3) 25 MCG (1000 UNIT) tablet Take 1,000 Units by mouth daily.     fexofenadine (ALLEGRA) 180 MG tablet Take 180 mg by mouth daily.     hydrochlorothiazide (HYDRODIURIL) 12.5 MG tablet Take 12.5 mg by mouth daily.     metoprolol succinate (TOPROL-XL) 25 MG 24 hr tablet Take 25 mg by mouth daily.     No current facility-administered medications for this visit.    Past Medical History:  Diagnosis Date   Breast cancer (HCC) 2010   RT LUMPECTOMY   Diabetes (HCC)    Diverticulosis    Hypercholesterolemia    Hypertension    Intention tremor    Personal history of chemotherapy 2010   BREAST CA   Personal history of radiation therapy 2010   BREAST CA    Past Surgical History:  Procedure Laterality Date    ABDOMINAL HYSTERECTOMY     APPENDECTOMY     BREAST EXCISIONAL BIOPSY Right 2010   positive   INTRAMEDULLARY (IM) NAIL INTERTROCHANTERIC Left 07/08/2018   Procedure: INTRAMEDULLARY (IM) NAIL INTERTROCHANTRIC;  Surgeon: Signa Kell, MD;  Location: [facility] ORS;  Service: Orthopedics;  Laterality: Left;   PULMONARY THROMBECTOMY Bilateral 12/25/2022   Procedure: PULMONARY THROMBECTOMY;  Surgeon: Annice Needy, MD;  Location: [facility] INVASIVE CV LAB;  Service: Cardiovascular;  Laterality: Bilateral;     Social History   Tobacco Use   Smoking status: Never   Smokeless tobacco: Never  Vaping Use   Vaping status: Never Used  Substance Use Topics   Alcohol use: No   Drug use: No       Family History  Problem Relation Age of Onset   Breast cancer Daughter 22     Allergies  Allergen Reactions   Fish Allergy    Shellfish Allergy Swelling     REVIEW OF SYSTEMS (Negative unless checked)  Constitutional: [] Weight loss  [] Fever  [] Chills Cardiac: [] Chest pain   [] Chest pressure   [] Palpitations   [] Shortness of breath when laying flat   [] Shortness of breath at rest   [] Shortness of breath with exertion. Vascular:  [] Pain in legs with walking   [] Pain in legs at rest   []   Pain in legs when laying flat   [] Claudication   [] Pain in feet when walking  [] Pain in feet at rest  [] Pain in feet when laying flat   [x] History of DVT   [x] Phlebitis   [x] Swelling in legs   [] Varicose veins   [] Non-healing ulcers Pulmonary:   [] Uses home oxygen   [] Productive cough   [] Hemoptysis   [] Wheeze  [] COPD   [] Asthma Neurologic:  [] Dizziness  [] Blackouts   [] Seizures   [] History of stroke   [] History of TIA  [] Aphasia   [] Temporary blindness   [] Dysphagia   [] Weakness or numbness in arms   [] Weakness or numbness in legs Musculoskeletal:  [x] Arthritis   [] Joint swelling   [] Joint pain   [] Low back pain Hematologic:  [] Easy bruising  [] Easy bleeding   [] Hypercoagulable state   [] Anemic   Gastrointestinal:  [] Blood in  stool   [] Vomiting blood  [] Gastroesophageal reflux/heartburn   [] Abdominal pain Genitourinary:  [] Chronic kidney disease   [] Difficult urination  [] Frequent urination  [] Burning with urination   [] Hematuria Skin:  [] Rashes   [] Ulcers   [] Wounds Psychological:  [] History of anxiety   []  History of major depression.  Physical Examination  BP 117/73 (BP Location: Left Arm)   Pulse 66   Resp 15  Gen:  WD/WN, NAD.  Appears much younger than stated age Head: [place]/AT, No temporalis wasting. Ear/Nose/Throat: Hearing grossly intact, nares w/o erythema or drainage Eyes: Conjunctiva clear. Sclera non-icteric Neck: Supple.  Trachea midline Pulmonary:  Good air movement, no use of accessory muscles.  Cardiac: Somewhat irregular Vascular:  Vessel Right Left  Radial Palpable Palpable           Musculoskeletal: M/S 5/5 throughout.  No deformity or atrophy.  1-2+ right lower extremity edema, trace left lower extremity edema.  In a wheelchair today. Neurologic: Sensation grossly intact in extremities.  Symmetrical.  Speech is fluent.  Psychiatric: Judgment intact, Mood & affect appropriate for pt's clinical situation. Dermatologic: No rashes or ulcers noted.  No cellulitis or open wounds.      Labs Recent Results (from the past 2160 hour(s))  Basic metabolic panel     Status: Abnormal   Collection Time: 12/23/22  4:45 PM  Result Value Ref Range   Sodium 130 (L) 135 - 145 mmol/L   Potassium 2.9 (L) 3.5 - 5.1 mmol/L   Chloride 92 (L) 98 - 111 mmol/L   CO2 24 22 - 32 mmol/L   Glucose, Bld 228 (H) 70 - 99 mg/dL    Comment: Glucose reference range applies only to samples taken after fasting for at least 8 hours.   BUN 25 (H) 8 - 23 mg/dL   Creatinine, Ser 1.61 (H) 0.44 - 1.00 mg/dL   Calcium 9.0 8.9 - 09.6 mg/dL   GFR, Estimated 38 (L) >60 mL/min    Comment: (NOTE) Calculated using the CKD-EPI Creatinine Equation (2021)    Anion gap 14 5 - 15    Comment: Performed at [facility],  [address] [place], [place] [address]  CBC     Status: Abnormal   Collection Time: 12/23/22  4:45 PM  Result Value Ref Range   WBC 14.0 (H) 4.0 - 10.5 K/uL   RBC 4.64 3.87 - 5.11 MIL/uL   Hemoglobin 13.0 12.0 - 15.0 g/dL   HCT 98.1 19.1 - 47.8 %   MCV 82.5 80.0 - 100.0 fL   MCH 28.0 26.0 - 34.0 pg   MCHC 33.9 30.0 - 36.0 g/dL  RDW 13.6 11.5 - 15.5 %   Platelets 238 150 - 400 K/uL   nRBC 0.0 0.0 - 0.2 %    Comment: Performed at [facility], [address] [place], [place] [address]  Troponin I (High Sensitivity)     Status: None   Collection Time: 12/23/22  4:45 PM  Result Value Ref Range   Troponin I (High Sensitivity) 15 <18 ng/L    Comment: (NOTE) Elevated high sensitivity troponin I (hsTnI) values and significant  changes across serial measurements may suggest ACS but many other  chronic and acute conditions are known to elevate hsTnI results.  Refer to the "Links" section for chest pain algorithms and additional  guidance. Performed at [facility], [address] [place], [place] [address]   Procalcitonin     Status: None   Collection Time: 12/23/22  4:45 PM  Result Value Ref Range   Procalcitonin <0.10 ng/mL    Comment:        Interpretation: PCT (Procalcitonin) <= 0.5 ng/mL: Systemic infection (sepsis) is not likely. Local bacterial infection is possible. (NOTE)       Sepsis PCT Algorithm           Lower Respiratory Tract                                      Infection PCT Algorithm    ----------------------------     ----------------------------         PCT < 0.25 ng/mL                PCT < 0.10 ng/mL          Strongly encourage             Strongly discourage   discontinuation of antibiotics    initiation of antibiotics    ----------------------------     -----------------------------       PCT 0.25 - 0.50 ng/mL            PCT 0.10 - 0.25 ng/mL               OR       >80% decrease in PCT            Discourage initiation of                                             antibiotics      Encourage discontinuation           of antibiotics    ----------------------------     -----------------------------         PCT >= 0.50 ng/mL              PCT 0.26 - 0.50 ng/mL               AND        <80% decrease in PCT             Encourage initiation of                                             antibiotics       Encourage continuation  of antibiotics    ----------------------------     -----------------------------        PCT >= 0.50 ng/mL                  PCT > 0.50 ng/mL               AND         increase in PCT                  Strongly encourage                                      initiation of antibiotics    Strongly encourage escalation           of antibiotics                                     -----------------------------                                           PCT <= 0.25 ng/mL                                                 OR                                        > 80% decrease in PCT                                      Discontinue / Do not initiate                                             antibiotics  Performed at [facility], [address], [place], [place] [address]   Troponin I (High Sensitivity)     Status: Abnormal   Collection Time: 12/23/22  7:46 PM  Result Value Ref Range   Troponin I (High Sensitivity) 20 (H) <18 ng/L    Comment: (NOTE) Elevated high sensitivity troponin I (hsTnI) values and significant  changes across serial measurements may suggest ACS but many other  chronic and acute conditions are known to elevate hsTnI results.  Refer to the "Links" section for chest pain algorithms and additional  guidance. Performed at [facility], [address] [place], [place] [address]   Hepatic function panel     Status: None   Collection Time: 12/23/22  7:46 PM  Result Value Ref Range   Total Protein 7.7 6.5 - 8.1 g/dL   Albumin 3.9 3.5 - 5.0 g/dL    AST 25 15 - 41 U/L   ALT 13 0 - 44 U/L   Alkaline Phosphatase 72 38 - 126 U/L   Total Bilirubin 0.8 0.3 - 1.2 mg/dL   Bilirubin, Direct 0.1  0.0 - 0.2 mg/dL   Indirect Bilirubin 0.7 0.3 - 0.9 mg/dL    Comment: Performed at [facility], [address] [place], [place] [address]  SARS Coronavirus 2 by RT PCR (hospital order, performed in [facility] hospital lab) *cepheid single result test* Anterior Nasal Swab     Status: None   Collection Time: 12/23/22  9:48 PM   Specimen: Anterior Nasal Swab  Result Value Ref Range   SARS Coronavirus 2 by RT PCR NEGATIVE NEGATIVE    Comment: (NOTE) SARS-CoV-2 target nucleic acids are NOT DETECTED.  The SARS-CoV-2 RNA is generally detectable in upper and lower respiratory specimens during the acute phase of infection. The lowest concentration of SARS-CoV-2 viral copies this assay can detect is 250 copies / mL. A negative result does not preclude SARS-CoV-2 infection and should not be used as the sole basis for treatment or other patient management decisions.  A negative result may occur with improper specimen collection / handling, submission of specimen other than nasopharyngeal swab, presence of viral mutation(s) within the areas targeted by this assay, and inadequate number of viral copies (<250 copies / mL). A negative result must be combined with clinical observations, patient history, and epidemiological information.  Fact Sheet for Patients:   RoadLapTop.co.za  Fact Sheet for Healthcare Providers: http://kim-miller.com/  This test is not yet approved or  cleared by the [place] FDA and has been authorized for detection and/or diagnosis of SARS-CoV-2 by FDA under an Emergency Use Authorization (EUA).  This EUA will remain in effect (meaning this test can be used) for the duration of the COVID-19 declaration under Section 564(b)(1) of the Act, 21 U.S.C. section 360bbb-3(b)(1),  unless the authorization is terminated or revoked sooner.  Performed at [facility], [address] [place], [place] [address]   APTT     Status: None   Collection Time: 12/23/22 10:28 PM  Result Value Ref Range   aPTT 27 24 - 36 seconds    Comment: Performed at [facility], [address] [place], [place] [address]  Protime-INR     Status: None   Collection Time: 12/23/22 10:28 PM  Result Value Ref Range   Prothrombin Time 14.6 11.4 - 15.2 seconds   INR 1.1 0.8 - 1.2    Comment: (NOTE) INR goal varies based on device and disease states. Performed at [facility], [address] [place], [place] [address]   Comprehensive metabolic panel     Status: Abnormal   Collection Time: 12/24/22  4:13 AM  Result Value Ref Range   Sodium 132 (L) 135 - 145 mmol/L   Potassium 3.3 (L) 3.5 - 5.1 mmol/L   Chloride 96 (L) 98 - 111 mmol/L   CO2 26 22 - 32 mmol/L   Glucose, Bld 319 (H) 70 - 99 mg/dL    Comment: Glucose reference range applies only to samples taken after fasting for at least 8 hours.   BUN 26 (H) 8 - 23 mg/dL   Creatinine, Ser 2.95 (H) 0.44 - 1.00 mg/dL   Calcium 8.6 (L) 8.9 - 10.3 mg/dL   Total Protein 6.5 6.5 - 8.1 g/dL   Albumin 3.2 (L) 3.5 - 5.0 g/dL   AST 23 15 - 41 U/L   ALT 13 0 - 44 U/L   Alkaline Phosphatase 63 38 - 126 U/L   Total Bilirubin 0.7 0.3 - 1.2 mg/dL   GFR, Estimated 39 (L) >60 mL/min    Comment: (NOTE) Calculated  using the CKD-EPI Creatinine Equation (2021)    Anion gap 10 5 - 15    Comment: Performed at [facility], [address] [place], [place] [address]  CBC     Status: Abnormal   Collection Time: 12/24/22  4:13 AM  Result Value Ref Range   WBC 10.6 (H) 4.0 - 10.5 K/uL   RBC 4.20 3.87 - 5.11 MIL/uL   Hemoglobin 11.9 (L) 12.0 - 15.0 g/dL   HCT 60.4 (L) 54.0 - 98.1 %   MCV 85.0 80.0 - 100.0 fL   MCH 28.3 26.0 - 34.0 pg   MCHC 33.3 30.0 - 36.0 g/dL   RDW 19.1 47.8 - 29.5 %   Platelets 218 150 -  400 K/uL   nRBC 0.0 0.0 - 0.2 %    Comment: Performed at [facility], [address] [place], [place] [address]  Hemoglobin A1c     Status: Abnormal   Collection Time: 12/24/22  4:13 AM  Result Value Ref Range   Hgb A1c MFr Bld 7.1 (H) 4.8 - 5.6 %    Comment: (NOTE) Pre diabetes:          5.7%-6.4%  Diabetes:              >6.4%  Glycemic control for   <7.0% adults with diabetes    Mean Plasma Glucose 157.07 mg/dL    Comment: Performed at [facility], [address], [place], [place] [address]  Heparin level (unfractionated)     Status: None   Collection Time: 12/24/22  6:10 AM  Result Value Ref Range   Heparin Unfractionated 0.49 0.30 - 0.70 IU/mL    Comment: (NOTE) The clinical reportable range upper limit is being lowered to >1.10 to align with the FDA approved guidance for the current laboratory assay.  If heparin results are below expected values, and patient dosage has  been confirmed, suggest follow up testing of antithrombin III levels. Performed at [facility], [address] [place], [place] [address]   Heparin level (unfractionated)     Status: None   Collection Time: 12/24/22  2:59 PM  Result Value Ref Range   Heparin Unfractionated 0.61 0.30 - 0.70 IU/mL    Comment: (NOTE) The clinical reportable range upper limit is being lowered to >1.10 to align with the FDA approved guidance for the current laboratory assay.  If heparin results are below expected values, and patient dosage has  been confirmed, suggest follow up testing of antithrombin III levels. Performed at [facility], [address] [place], [place] [address]   Glucose, capillary     Status: Abnormal   Collection Time: 12/24/22  5:13 PM  Result Value Ref Range   Glucose-Capillary 240 (H) 70 - 99 mg/dL    Comment: Glucose reference range applies only to samples taken after fasting for at least 8 hours.  Glucose, capillary     Status: Abnormal   Collection  Time: 12/24/22  9:02 PM  Result Value Ref Range   Glucose-Capillary 206 (H) 70 - 99 mg/dL    Comment: Glucose reference range applies only to samples taken after fasting for at least 8 hours.  Basic metabolic panel     Status: Abnormal   Collection Time: 12/25/22  2:02 AM  Result Value Ref Range   Sodium 138 135 - 145 mmol/L   Potassium 3.7 3.5 - 5.1 mmol/L   Chloride 96 (L) 98 - 111 mmol/L   CO2 27 22 -  32 mmol/L   Glucose, Bld 138 (H) 70 - 99 mg/dL    Comment: Glucose reference range applies only to samples taken after fasting for at least 8 hours.   BUN 32 (H) 8 - 23 mg/dL   Creatinine, Ser 1.61 (H) 0.44 - 1.00 mg/dL   Calcium 9.0 8.9 - 09.6 mg/dL   GFR, Estimated 33 (L) >60 mL/min    Comment: (NOTE) Calculated using the CKD-EPI Creatinine Equation (2021)    Anion gap 15 5 - 15    Comment: Performed at [facility], [address] [place], [place] [address]  CBC     Status: Abnormal   Collection Time: 12/25/22  2:02 AM  Result Value Ref Range   WBC 16.4 (H) 4.0 - 10.5 K/uL   RBC 4.22 3.87 - 5.11 MIL/uL   Hemoglobin 11.8 (L) 12.0 - 15.0 g/dL   HCT 98.1 (L) 19.1 - 47.8 %   MCV 84.1 80.0 - 100.0 fL   MCH 28.0 26.0 - 34.0 pg   MCHC 33.2 30.0 - 36.0 g/dL   RDW 29.5 62.1 - 30.8 %   Platelets 238 150 - 400 K/uL   nRBC 0.0 0.0 - 0.2 %    Comment: Performed at [facility], [address] [place], [place] [address]  Magnesium     Status: None   Collection Time: 12/25/22  2:02 AM  Result Value Ref Range   Magnesium 1.9 1.7 - 2.4 mg/dL    Comment: Performed at [facility], [address] [place], [place] [address]  Heparin level (unfractionated)     Status: None   Collection Time: 12/25/22  2:02 AM  Result Value Ref Range   Heparin Unfractionated 0.70 0.30 - 0.70 IU/mL    Comment: (NOTE) The clinical reportable range upper limit is being lowered to >1.10 to align with the FDA approved guidance for the current laboratory assay.  If heparin  results are below expected values, and patient dosage has  been confirmed, suggest follow up testing of antithrombin III levels. Performed at [facility], [address] [place], [place] [address]   Glucose, capillary     Status: Abnormal   Collection Time: 12/25/22  7:57 AM  Result Value Ref Range   Glucose-Capillary 135 (H) 70 - 99 mg/dL    Comment: Glucose reference range applies only to samples taken after fasting for at least 8 hours.  ECHOCARDIOGRAM COMPLETE BUBBLE STUDY     Status: None   Collection Time: 12/25/22 10:46 AM  Result Value Ref Range   Ao pk vel 1.31 m/s   AV Area VTI 2.31 cm2   AR max vel 2.23 cm2   AV Mean grad 4.0 mmHg   AV Peak grad 6.8 mmHg   S' Lateral 2.30 cm   AV Area mean vel 1.99 cm2   Area-P 1/2 4.49 cm2   MV VTI 3.63 cm2   Est EF 65 - 70%   Glucose, capillary     Status: Abnormal   Collection Time: 12/25/22 11:49 AM  Result Value Ref Range   Glucose-Capillary 129 (H) 70 - 99 mg/dL    Comment: Glucose reference range applies only to samples taken after fasting for at least 8 hours.  Glucose, capillary     Status: Abnormal   Collection Time: 12/25/22  4:43 PM  Result Value Ref Range   Glucose-Capillary 171 (H) 70 - 99 mg/dL    Comment: Glucose reference range applies only to samples taken after  fasting for at least 8 hours.  Glucose, capillary     Status: Abnormal   Collection Time: 12/25/22  9:26 PM  Result Value Ref Range   Glucose-Capillary 255 (H) 70 - 99 mg/dL    Comment: Glucose reference range applies only to samples taken after fasting for at least 8 hours.  Basic metabolic panel     Status: Abnormal   Collection Time: 12/26/22  3:34 AM  Result Value Ref Range   Sodium 138 135 - 145 mmol/L   Potassium 3.8 3.5 - 5.1 mmol/L   Chloride 98 98 - 111 mmol/L   CO2 29 22 - 32 mmol/L   Glucose, Bld 202 (H) 70 - 99 mg/dL    Comment: Glucose reference range applies only to samples taken after fasting for at least 8 hours.   BUN 33  (H) 8 - 23 mg/dL   Creatinine, Ser 1.30 (H) 0.44 - 1.00 mg/dL   Calcium 8.3 (L) 8.9 - 10.3 mg/dL   GFR, Estimated 39 (L) >60 mL/min    Comment: (NOTE) Calculated using the CKD-EPI Creatinine Equation (2021)    Anion gap 11 5 - 15    Comment: Performed at [facility], [address] [place], [place] [address]  CBC     Status: Abnormal   Collection Time: 12/26/22  3:34 AM  Result Value Ref Range   WBC 7.7 4.0 - 10.5 K/uL   RBC 4.06 3.87 - 5.11 MIL/uL   Hemoglobin 11.3 (L) 12.0 - 15.0 g/dL   HCT 46.9 (L) 62.9 - 52.8 %   MCV 84.0 80.0 - 100.0 fL   MCH 27.8 26.0 - 34.0 pg   MCHC 33.1 30.0 - 36.0 g/dL   RDW 41.3 24.4 - 01.0 %   Platelets 218 150 - 400 K/uL   nRBC 0.0 0.0 - 0.2 %    Comment: Performed at [facility], [address], [place], [place] [address]  Magnesium     Status: None   Collection Time: 12/26/22  3:34 AM  Result Value Ref Range   Magnesium 1.9 1.7 - 2.4 mg/dL    Comment: Performed at [facility], [address] [place], [place] [address]  Heparin level (unfractionated)     Status: Abnormal   Collection Time: 12/26/22  3:34 AM  Result Value Ref Range   Heparin Unfractionated 0.86 (H) 0.30 - 0.70 IU/mL    Comment: (NOTE) The clinical reportable range upper limit is being lowered to >1.10 to align with the FDA approved guidance for the current laboratory assay.  If heparin results are below expected values, and patient dosage has  been confirmed, suggest follow up testing of antithrombin III levels. Performed at [facility], [address] [place], [place] [address]   Glucose, capillary     Status: Abnormal   Collection Time: 12/26/22  8:30 AM  Result Value Ref Range   Glucose-Capillary 196 (H) 70 - 99 mg/dL    Comment: Glucose reference range applies only to samples taken after fasting for at least 8 hours.  Glucose, capillary     Status: Abnormal   Collection Time: 12/26/22 12:25 PM  Result Value Ref Range    Glucose-Capillary 203 (H) 70 - 99 mg/dL    Comment: Glucose reference range applies only to samples taken after fasting for at least 8 hours.  Glucose, capillary     Status: Abnormal   Collection Time: 12/26/22  4:35 PM  Result Value Ref Range   Glucose-Capillary  181 (H) 70 - 99 mg/dL    Comment: Glucose reference range applies only to samples taken after fasting for at least 8 hours.  Glucose, capillary     Status: Abnormal   Collection Time: 12/26/22  9:59 PM  Result Value Ref Range   Glucose-Capillary 151 (H) 70 - 99 mg/dL    Comment: Glucose reference range applies only to samples taken after fasting for at least 8 hours.  Basic metabolic panel     Status: Abnormal   Collection Time: 12/27/22  3:21 AM  Result Value Ref Range   Sodium 138 135 - 145 mmol/L   Potassium 3.5 3.5 - 5.1 mmol/L   Chloride 98 98 - 111 mmol/L   CO2 27 22 - 32 mmol/L   Glucose, Bld 141 (H) 70 - 99 mg/dL    Comment: Glucose reference range applies only to samples taken after fasting for at least 8 hours.   BUN 40 (H) 8 - 23 mg/dL   Creatinine, Ser 9.52 (H) 0.44 - 1.00 mg/dL   Calcium 8.5 (L) 8.9 - 10.3 mg/dL   GFR, Estimated 32 (L) >60 mL/min    Comment: (NOTE) Calculated using the CKD-EPI Creatinine Equation (2021)    Anion gap 13 5 - 15    Comment: Performed at [facility], [address] [place], [place] [address]  CBC     Status: Abnormal   Collection Time: 12/27/22  3:21 AM  Result Value Ref Range   WBC 9.8 4.0 - 10.5 K/uL   RBC 4.05 3.87 - 5.11 MIL/uL   Hemoglobin 11.3 (L) 12.0 - 15.0 g/dL   HCT 44.0 (L) 10.2 - 72.5 %   MCV 82.5 80.0 - 100.0 fL   MCH 27.9 26.0 - 34.0 pg   MCHC 33.8 30.0 - 36.0 g/dL   RDW 36.6 44.0 - 34.7 %   Platelets 237 150 - 400 K/uL   nRBC 0.0 0.0 - 0.2 %    Comment: Performed at [facility], [address], [place], [place] [address]  Magnesium     Status: None   Collection Time: 12/27/22  3:21 AM  Result Value Ref Range   Magnesium 1.8 1.7 -  2.4 mg/dL    Comment: Performed at [facility], [address] [place], [place] [address]  Glucose, capillary     Status: Abnormal   Collection Time: 12/27/22  8:16 AM  Result Value Ref Range   Glucose-Capillary 101 (H) 70 - 99 mg/dL    Comment: Glucose reference range applies only to samples taken after fasting for at least 8 hours.  Glucose, capillary     Status: Abnormal   Collection Time: 12/27/22 11:25 AM  Result Value Ref Range   Glucose-Capillary 272 (H) 70 - 99 mg/dL    Comment: Glucose reference range applies only to samples taken after fasting for at least 8 hours.    Radiology ECHOCARDIOGRAM COMPLETE BUBBLE STUDY  Result Date: 12/25/2022    ECHOCARDIOGRAM REPORT   Patient Name:   Nancy Zhang Date of Exam: 12/25/2022 Medical Rec #:  [id number]        Height:       63.0 in Accession #:    [id number]       Weight:       132.4 lb Date of Birth:  [birth year]         BSA:          1.623 m Patient Age:    89 years  BP:           128/63 mmHg Patient Gender: F                HR:           78 bpm. Exam Location:  [facility] Procedure: 2D Echo, Cardiac Doppler, Color Doppler and Saline Contrast Bubble            Study Indications:     Pulmonary embolus  History:         Patient has no prior history of Echocardiogram examinations.                  Risk Factors:Hypertension, Diabetes and Dyslipidemia. Pulmonary                  embolus, dementia, Breast CA.  Sonographer:     Mikki Harbor Referring Phys:  [id number] Eliezer Mccoy PATEL Diagnosing Phys: Debbe Odea MD  Sonographer Comments: Image acquisition challenging due to patient behavioral factors. IMPRESSIONS  1. Left ventricular ejection fraction, by estimation, is 65 to 70%. The left ventricle has normal function. The left ventricle has no regional wall motion abnormalities. There is mild left ventricular hypertrophy. Left ventricular diastolic parameters were normal.  2. Right ventricular systolic function is normal. The right  ventricular size is normal. There is moderately elevated pulmonary artery systolic pressure.  3. Left atrial size was mild to moderately dilated.  4. The mitral valve is normal in structure. Trivial mitral valve regurgitation.  5. The aortic valve is tricuspid. Aortic valve regurgitation is not visualized. Aortic valve sclerosis is present, with no evidence of aortic valve stenosis.  6. Aortic dilatation noted. There is mild dilatation of the aortic root, measuring 39 mm.  7. The inferior vena cava is normal in size with greater than 50% respiratory variability, suggesting right atrial pressure of 3 mmHg. FINDINGS  Left Ventricle: Left ventricular ejection fraction, by estimation, is 65 to 70%. The left ventricle has normal function. The left ventricle has no regional wall motion abnormalities. The left ventricular internal cavity size was normal in size. There is  mild left ventricular hypertrophy. Left ventricular diastolic parameters were normal. Right Ventricle: The right ventricular size is normal. No increase in right ventricular wall thickness. Right ventricular systolic function is normal. There is moderately elevated pulmonary artery systolic pressure. The tricuspid regurgitant velocity is 3.35 m/s, and with an assumed right atrial pressure of 3 mmHg, the estimated right ventricular systolic pressure is 47.9 mmHg. Left Atrium: Left atrial size was mild to moderately dilated. Right Atrium: Right atrial size was normal in size. Pericardium: There is no evidence of pericardial effusion. Mitral Valve: The mitral valve is normal in structure. Trivial mitral valve regurgitation. MV peak gradient, 3.4 mmHg. The mean mitral valve gradient is 1.0 mmHg. Tricuspid Valve: The tricuspid valve is normal in structure. Tricuspid valve regurgitation is mild. Aortic Valve: The aortic valve is tricuspid. Aortic valve regurgitation is not visualized. Aortic valve sclerosis is present, with no evidence of aortic valve stenosis.  Aortic valve mean gradient measures 4.0 mmHg. Aortic valve peak gradient measures 6.8  mmHg. Aortic valve area, by VTI measures 2.31 cm. Pulmonic Valve: The pulmonic valve was not well visualized. Pulmonic valve regurgitation is not visualized. Aorta: Aortic dilatation noted. There is mild dilatation of the aortic root, measuring 39 mm. Venous: The inferior vena cava is normal in size with greater than 50% respiratory variability, suggesting right atrial pressure of 3 mmHg. IAS/Shunts: No atrial level  shunt detected by color flow Doppler.  LEFT VENTRICLE PLAX 2D LVIDd:         4.30 cm   Diastology LVIDs:         2.30 cm   LV e' medial:    10.40 cm/s LV PW:         1.00 cm   LV E/e' medial:  8.3 LV IVS:        1.20 cm   LV e' lateral:   7.40 cm/s LVOT diam:     2.00 cm   LV E/e' lateral: 11.6 LV SV:         73 LV SV Index:   45 LVOT Area:     3.14 cm  RIGHT VENTRICLE RV Basal diam:  3.15 cm RV Mid diam:    2.80 cm RV S prime:     17.10 cm/s TAPSE (M-mode): 2.7 cm LEFT ATRIUM             Index        RIGHT ATRIUM           Index LA diam:        3.80 cm 2.34 cm/m   RA Area:     18.30 cm LA Vol (A2C):   42.7 ml 26.31 ml/m  RA Volume:   49.50 ml  30.50 ml/m LA Vol (A4C):   73.4 ml 45.23 ml/m LA Biplane Vol: 59.1 ml 36.42 ml/m  AORTIC VALVE                    PULMONIC VALVE AV Area (Vmax):    2.23 cm     PV Vmax:       1.22 m/s AV Area (Vmean):   1.99 cm     PV Peak grad:  6.0 mmHg AV Area (VTI):     2.31 cm AV Vmax:           130.50 cm/s AV Vmean:          93.800 cm/s AV VTI:            0.314 m AV Peak Grad:      6.8 mmHg AV Mean Grad:      4.0 mmHg LVOT Vmax:         92.60 cm/s LVOT Vmean:        59.300 cm/s LVOT VTI:          0.231 m LVOT/AV VTI ratio: 0.74  AORTA Ao Root diam: 3.90 cm MITRAL VALVE               TRICUSPID VALVE MV Area (PHT): 4.49 cm    TR Peak grad:   44.9 mmHg MV Area VTI:   3.63 cm    TR Vmax:        335.00 cm/s MV Peak grad:  3.4 mmHg MV Mean grad:  1.0 mmHg    SHUNTS MV Vmax:        0.92 m/s    Systemic VTI:  0.23 m MV Vmean:      54.3 cm/s   Systemic Diam: 2.00 cm MV Decel Time: 169 msec MV E velocity: 85.90 cm/s MV A velocity: 58.70 cm/s MV E/A ratio:  1.46 Debbe Odea MD Electronically signed by Debbe Odea MD Signature Date/Time: 12/25/2022/1:35:00 PM    Final (Updated)    PERIPHERAL VASCULAR CATHETERIZATION  Result Date: 12/25/2022 See surgical note for result.  US Venous Img Lower Bilateral (DVT)  Result Date: 12/24/2022 CLINICAL DATA:  Pulmonary  embolism. Evaluate for lower extremity DVT. History of breast cancer. EXAM: BILATERAL LOWER EXTREMITY VENOUS DOPPLER ULTRASOUND TECHNIQUE: Gray-scale sonography with graded compression, as well as color Doppler and duplex ultrasound were performed to evaluate the lower extremity deep venous systems from the level of the common femoral vein and including the common femoral, femoral, profunda femoral, popliteal and calf veins including the posterior tibial, peroneal and gastrocnemius veins when visible. The superficial great saphenous vein was also interrogated. Spectral Doppler was utilized to evaluate flow at rest and with distal augmentation maneuvers in the common femoral, femoral and popliteal veins. COMPARISON:  None Available. FINDINGS: RIGHT LOWER EXTREMITY Common Femoral Vein: No evidence of thrombus. Normal compressibility, respiratory phasicity and response to augmentation. Saphenofemoral Junction: No evidence of thrombus. Normal compressibility and flow on color Doppler imaging. Profunda Femoral Vein: No evidence of thrombus. Normal compressibility and flow on color Doppler imaging. Femoral Vein: No evidence of thrombus. Normal compressibility, respiratory phasicity and response to augmentation. Popliteal Vein: No evidence of thrombus. Normal compressibility, respiratory phasicity and response to augmentation. Calf Veins: Appear patent where imaged. Superficial Great Saphenous Vein: No evidence of thrombus. Normal  compressibility. Other Findings:  None. LEFT LOWER EXTREMITY Common Femoral Vein: No evidence of thrombus. Normal compressibility, respiratory phasicity and response to augmentation. Saphenofemoral Junction: No evidence of thrombus. Normal compressibility and flow on color Doppler imaging. Profunda Femoral Vein: No evidence of thrombus. Normal compressibility and flow on color Doppler imaging. Femoral Vein: No evidence of thrombus. Normal compressibility, respiratory phasicity and response to augmentation. Popliteal Vein: No evidence of thrombus. Normal compressibility, respiratory phasicity and response to augmentation. Calf Veins: No evidence of thrombus. Normal compressibility and flow on color Doppler imaging. Superficial Great Saphenous Vein: No evidence of thrombus. Normal compressibility. Other Findings:  None. IMPRESSION: No evidence of DVT within either lower extremity. Electronically Signed   By: Simonne Come M.D.   On: 12/24/2022 12:28   CT Angio Chest PE W and/or Wo Contrast  Result Date: 12/23/2022 CLINICAL DATA:  Chest pain.  PE suspected. EXAM: CT ANGIOGRAPHY CHEST WITH CONTRAST TECHNIQUE: Multidetector CT imaging of the chest was performed using the standard protocol during bolus administration of intravenous contrast. Multiplanar CT image reconstructions and MIPs were obtained to evaluate the vascular anatomy. RADIATION DOSE REDUCTION: This exam was performed according to the departmental dose-optimization program which includes automated exposure control, adjustment of the mA and/or kV according to patient size and/or use of iterative reconstruction technique. CONTRAST:  60mL OMNIPAQUE IOHEXOL 350 MG/ML SOLN COMPARISON:  Radiographs earlier today FINDINGS: Cardiovascular: Acute pulmonary embolism at the bifurcation of the main pulmonary artery extending into the right and left pulmonary arteries as well as multiple lobar and segmental arteries bilaterally. There is evidence of right heart strain  with RV/LV ratio of 1.25. No pericardial effusion. Coronary artery and aortic atherosclerotic calcification. Mediastinum/Nodes: Small hiatal hernia. Trachea is unremarkable. No thoracic adenopathy. Lungs/Pleura: Biapical pleural-parenchymal scarring. Mild bronchial wall thickening. Scattered centrilobular micro nodules and abutting tree opacities greatest in the left upper lobe. No pleural effusion or pneumothorax. Upper Abdomen: No acute abnormality. Musculoskeletal: No acute fracture. Review of the MIP images confirms the above findings. IMPRESSION: 1. Positive for acute PE with questionable CT evidence of right heart strain (RV/LV Ratio = 1.25). The appearance of the ventricles is subjectively within normal limits and prominent right ventricle may be a chronic finding given patient age. The presence of right heart strain has been associated with an increased risk of morbidity and mortality. Please  refer to the "Code PE Focused" order set in EPIC. 2. Infectious/inflammatory bronchiolitis. 3. Aortic Atherosclerosis (ICD10-I70.0). Critical Value/emergent results were called by telephone at the time of interpretation on 12/23/2022 at 11:08 pm to provider PHILLIP STAFFORD , who verbally acknowledged these results. Electronically Signed   By: Minerva Fester M.D.   On: 12/23/2022 23:26   DG Chest 2 View  Result Date: 12/23/2022 CLINICAL DATA:  Midsternal sharp chest pain. Diminished breath sounds EXAM: CHEST - 2 VIEW COMPARISON:  Radiographs 08/31/2021 FINDINGS: Stable cardiomediastinal silhouette. Aortic atherosclerotic calcification. No focal consolidation, pleural effusion, or pneumothorax. No displaced rib fractures. IMPRESSION: No active cardiopulmonary disease. Electronically Signed   By: Minerva Fester M.D.   On: 12/23/2022 19:28    Assessment/Plan  Acute pulmonary embolism (HCC) Doing well status post thrombectomy about a month ago.  Not currently having any respiratory symptoms.  Tolerating  anticoagulation.  She should be on anticoagulation for at least 1 year and at that point given her advanced age and immobility, consideration for reduction to prophylactic dose of Eliquis should be given.  I will see her back as needed.  HTN (hypertension) blood pressure control important in reducing the progression of atherosclerotic disease. On appropriate oral medications.    Festus Barren, MD  01/22/2023 11:53 AM    This note was created with Dragon medical transcription system.  Any errors from dictation are purely unintentional

## 2023-01-22 NOTE — Assessment & Plan Note (Signed)
blood pressure control important in reducing the progression of atherosclerotic disease. On appropriate oral medications.  

## 2023-01-22 NOTE — Assessment & Plan Note (Signed)
Doing well status post thrombectomy about a month ago.  Not currently having any respiratory symptoms.  Tolerating anticoagulation.  She should be on anticoagulation for at least 1 year and at that point given her advanced age and immobility, consideration for reduction to prophylactic dose of Eliquis should be given.  I will see her back as needed.

## 2023-10-06 ENCOUNTER — Encounter: Payer: Self-pay | Admitting: Oncology

## 2023-10-08 ENCOUNTER — Encounter: Payer: Self-pay | Admitting: Oncology

## 2023-10-18 ENCOUNTER — Encounter: Payer: Self-pay | Admitting: Oncology

## 2024-04-21 ENCOUNTER — Encounter: Payer: Self-pay | Admitting: Oncology

## 2024-04-21 ENCOUNTER — Other Ambulatory Visit: Payer: Self-pay

## 2024-04-21 ENCOUNTER — Emergency Department

## 2024-04-21 ENCOUNTER — Emergency Department
Admission: EM | Admit: 2024-04-21 | Source: Home / Self Care | Attending: Emergency Medicine | Admitting: Emergency Medicine

## 2024-04-21 DIAGNOSIS — R531 Weakness: Secondary | ICD-10-CM

## 2024-04-21 DIAGNOSIS — W19XXXA Unspecified fall, initial encounter: Secondary | ICD-10-CM

## 2024-04-21 LAB — URINALYSIS, ROUTINE W REFLEX MICROSCOPIC
Bilirubin Urine: NEGATIVE
Glucose, UA: NEGATIVE mg/dL
Ketones, ur: NEGATIVE mg/dL
Leukocytes,Ua: NEGATIVE
Nitrite: NEGATIVE
Protein, ur: 30 mg/dL — AB
Specific Gravity, Urine: 1.015 (ref 1.005–1.030)
WBC, UA: 0 WBC/hpf (ref 0–5)
pH: 5 (ref 5.0–8.0)

## 2024-04-21 LAB — CBC WITH DIFFERENTIAL/PLATELET
Abs Immature Granulocytes: 0.03 10*3/uL (ref 0.00–0.07)
Basophils Absolute: 0.1 10*3/uL (ref 0.0–0.1)
Basophils Relative: 1 %
Eosinophils Absolute: 0.2 10*3/uL (ref 0.0–0.5)
Eosinophils Relative: 2 %
HCT: 39.8 % (ref 36.0–46.0)
Hemoglobin: 12.9 g/dL (ref 12.0–15.0)
Immature Granulocytes: 0 %
Lymphocytes Relative: 18 %
Lymphs Abs: 1.5 10*3/uL (ref 0.7–4.0)
MCH: 27.5 pg (ref 26.0–34.0)
MCHC: 32.4 g/dL (ref 30.0–36.0)
MCV: 84.9 fL (ref 80.0–100.0)
Monocytes Absolute: 0.8 10*3/uL (ref 0.1–1.0)
Monocytes Relative: 9 %
Neutro Abs: 5.9 10*3/uL (ref 1.7–7.7)
Neutrophils Relative %: 70 %
Platelets: 267 10*3/uL (ref 150–400)
RBC: 4.69 MIL/uL (ref 3.87–5.11)
RDW: 14.6 % (ref 11.5–15.5)
WBC: 8.4 10*3/uL (ref 4.0–10.5)
nRBC: 0 % (ref 0.0–0.2)

## 2024-04-21 LAB — TROPONIN T, HIGH SENSITIVITY: Troponin T High Sensitivity: 37 ng/L — ABNORMAL HIGH (ref 0–19)

## 2024-04-21 LAB — BASIC METABOLIC PANEL WITH GFR
Anion gap: 16 — ABNORMAL HIGH (ref 5–15)
BUN: 47 mg/dL — ABNORMAL HIGH (ref 8–23)
CO2: 24 mmol/L (ref 22–32)
Calcium: 9.7 mg/dL (ref 8.9–10.3)
Chloride: 97 mmol/L — ABNORMAL LOW (ref 98–111)
Creatinine, Ser: 2.17 mg/dL — ABNORMAL HIGH (ref 0.44–1.00)
GFR, Estimated: 20 mL/min — ABNORMAL LOW
Glucose, Bld: 265 mg/dL — ABNORMAL HIGH (ref 70–99)
Potassium: 3.9 mmol/L (ref 3.5–5.1)
Sodium: 138 mmol/L (ref 135–145)

## 2024-04-21 LAB — CBG MONITORING, ED: Glucose-Capillary: 241 mg/dL — ABNORMAL HIGH (ref 70–99)

## 2024-04-21 MED ORDER — SODIUM CHLORIDE 0.9 % IV BOLUS: 1000.0000 mL | Freq: Once | INTRAVENOUS | Status: AC

## 2024-04-21 NOTE — ED Notes (Signed)
 First nurse note: Pt presents for fall. Unknown down time/time of fall. Found by caretakers ambulating around the house s/p fall. Family states she is not her normal self Hx dementia and UTI. Malodorous urine smell.   BGL 389.  Temp: 98.8 oral BP: 136/58 HR 78

## 2024-04-21 NOTE — ED Provider Notes (Signed)
 "  [facility] Provider Note    Event Date/Time   First MD Initiated Contact with Patient 04/21/24 2233     (approximate)  History   Chief Complaint: Fall  HPI  Nancy Zhang is a 89 y.o. female with a past medical history of diabetes, hypertension, hyperlipidemia, mild dementia, presents to the emergency department for increased weakness, inability to ambulate and possible fall.  Son states the patient is normally fairly high functioning lives alone and cares for herself with family's help.  They state over the last day or 2 she has been progressively more weak and today she was having trouble standing or walking even with her walker which is what she uses at baseline.  No fever no vomiting or diarrhea.  Here the patient does appear weak took 2 people to get the patient from the wheelchair to the stretcher.  Physical Exam   Triage Vital Signs: ED Triage Vitals [04/21/24 1937]  Encounter Vitals Group     BP (!) 150/67     Girls Systolic BP Percentile      Girls Diastolic BP Percentile      Boys Systolic BP Percentile      Boys Diastolic BP Percentile      Pulse Rate 70     Resp 18     Temp 97.8 F (36.6 C)     Temp Source Oral     SpO2 96 %     Weight      Height 5' 3 (1.6 m)     Head Circumference      Peak Flow      Pain Score      Pain Loc      Pain Education      Exclude from Growth Chart     Most recent vital signs: Vitals:   04/21/24 1937  BP: (!) 150/67  Pulse: 70  Resp: 18  Temp: 97.8 F (36.6 C)  SpO2: 96%    General: Awake, no distress.  CV:  Good peripheral perfusion.  Regular rate and rhythm  Resp:  Normal effort.  Equal breath sounds bilaterally.  Abd:  No distention.  Soft, nontender.  No rebound or guarding.  ED Results / Procedures / Treatments   RADIOLOGY  I have reviewed and interpreted the CT head images.  No large bleed or significant abnormality seen on my evaluation. Radiology has read the CT scan as  negative for acute abnormality.   MEDICATIONS ORDERED IN ED: Medications  sodium chloride  0.9 % bolus 1,000 mL (has no administration in time range)     IMPRESSION / MDM / ASSESSMENT AND PLAN / ED COURSE  I reviewed the triage vital signs and the nursing notes.  Patient's presentation is most consistent with acute presentation with potential threat to life or bodily function.  Patient presents to the emergency department for generalized weakness and possible fall today.  Overall the patient appears well but she is weak took 2 people to get her from the wheelchair to the stretcher and the patient typically lives alone and cares for herself largely.  Patient's workup today shows a reassuring CBC reassuring chemistry with mild renal insufficiency.  Baseline creatinine around 1.5 currently 2.17.  Will IV hydrate with fluids.  Will obtain a troponin and an EKG.  Will obtain a urine sample we will IV hydrate and continue to closely monitor.   Patient care signed out to oncoming provider troponin and urinalysis pending.  FINAL CLINICAL IMPRESSION(S) /  ED DIAGNOSES   Weakness Fall   Note:  This document was prepared using Dragon voice recognition software and may include unintentional dictation errors.   Dorothyann Drivers, MD 04/21/24 2318  "

## 2024-04-21 NOTE — ED Triage Notes (Signed)
 Pt to ed from home via [facility] for a possible fall. Pt lives by herself with dementia. Pt son is her with her. Pt is obviously confused. She is oriented to self, place and son but not date, time and situation.

## 2024-04-21 NOTE — ED Notes (Signed)
 Blue, red, green and lavendar tubes sent down
